# Patient Record
Sex: Male | Born: 1945 | Race: Black or African American | Hispanic: No | Marital: Married | Smoking: Former smoker
Health system: Southern US, Community
[De-identification: ages and names within clinical notes are randomized; demographics above are authoritative.]

## PROBLEM LIST (undated history)

## (undated) DIAGNOSIS — I2510 Atherosclerotic heart disease of native coronary artery without angina pectoris: Secondary | ICD-10-CM

## (undated) DIAGNOSIS — F0390 Unspecified dementia without behavioral disturbance: Secondary | ICD-10-CM

## (undated) DIAGNOSIS — Z951 Presence of aortocoronary bypass graft: Secondary | ICD-10-CM

## (undated) DIAGNOSIS — E7800 Pure hypercholesterolemia, unspecified: Secondary | ICD-10-CM

## (undated) DIAGNOSIS — M109 Gout, unspecified: Secondary | ICD-10-CM

## (undated) DIAGNOSIS — I10 Essential (primary) hypertension: Secondary | ICD-10-CM

## (undated) DIAGNOSIS — I214 Non-ST elevation (NSTEMI) myocardial infarction: Secondary | ICD-10-CM

## (undated) DIAGNOSIS — G4730 Sleep apnea, unspecified: Secondary | ICD-10-CM

## (undated) DIAGNOSIS — Z720 Tobacco use: Secondary | ICD-10-CM

## (undated) HISTORY — PX: CARDIAC SURGERY: SHX584

## (undated) HISTORY — PX: BACK SURGERY: SHX140

## (undated) HISTORY — DX: Unspecified dementia, unspecified severity, without behavioral disturbance, psychotic disturbance, mood disturbance, and anxiety: F03.90

---

## 2000-07-27 ENCOUNTER — Ambulatory Visit (HOSPITAL_COMMUNITY): Admission: RE | Admit: 2000-07-27 | Discharge: 2000-07-27 | Payer: Self-pay | Admitting: *Deleted

## 2000-07-27 ENCOUNTER — Encounter: Payer: Self-pay | Admitting: Family Medicine

## 2000-10-07 ENCOUNTER — Encounter: Payer: Self-pay | Admitting: Neurological Surgery

## 2000-10-09 ENCOUNTER — Encounter: Payer: Self-pay | Admitting: Neurological Surgery

## 2000-10-09 ENCOUNTER — Ambulatory Visit (HOSPITAL_COMMUNITY): Admission: RE | Admit: 2000-10-09 | Discharge: 2000-10-10 | Payer: Self-pay | Admitting: Neurological Surgery

## 2002-12-02 HISTORY — PX: COLONOSCOPY: SHX174

## 2003-09-12 ENCOUNTER — Ambulatory Visit (HOSPITAL_COMMUNITY): Admission: RE | Admit: 2003-09-12 | Discharge: 2003-09-12 | Payer: Self-pay | Admitting: Internal Medicine

## 2003-11-29 ENCOUNTER — Ambulatory Visit (HOSPITAL_COMMUNITY): Admission: RE | Admit: 2003-11-29 | Discharge: 2003-11-29 | Payer: Self-pay | Admitting: Family Medicine

## 2004-10-04 ENCOUNTER — Ambulatory Visit (HOSPITAL_COMMUNITY): Admission: RE | Admit: 2004-10-04 | Discharge: 2004-10-04 | Payer: Self-pay | Admitting: Family Medicine

## 2005-01-31 ENCOUNTER — Ambulatory Visit (HOSPITAL_COMMUNITY): Admission: RE | Admit: 2005-01-31 | Discharge: 2005-01-31 | Payer: Self-pay | Admitting: Family Medicine

## 2007-12-03 HISTORY — PX: COLONOSCOPY: SHX174

## 2008-09-22 ENCOUNTER — Ambulatory Visit: Payer: Self-pay | Admitting: Internal Medicine

## 2008-09-27 ENCOUNTER — Encounter: Payer: Self-pay | Admitting: Internal Medicine

## 2008-09-27 ENCOUNTER — Ambulatory Visit (HOSPITAL_COMMUNITY): Admission: RE | Admit: 2008-09-27 | Discharge: 2008-09-27 | Payer: Self-pay | Admitting: Internal Medicine

## 2008-09-27 ENCOUNTER — Ambulatory Visit: Payer: Self-pay | Admitting: Internal Medicine

## 2008-11-18 ENCOUNTER — Ambulatory Visit (HOSPITAL_COMMUNITY): Admission: RE | Admit: 2008-11-18 | Discharge: 2008-11-18 | Payer: Self-pay | Admitting: Family Medicine

## 2010-12-28 ENCOUNTER — Ambulatory Visit (HOSPITAL_COMMUNITY)
Admission: RE | Admit: 2010-12-28 | Discharge: 2010-12-28 | Payer: Self-pay | Source: Home / Self Care | Attending: Family Medicine | Admitting: Family Medicine

## 2011-04-16 NOTE — H&P (Signed)
NAME:  ADVIT, TRETHEWEY          ACCOUNT NO.:  [id number]   MEDICAL RECORD NO.:  [id number]          PATIENT TYPE:  AMB   LOCATION:  DAY                           FACILITY:  [facility]   PHYSICIAN:  R. Roetta Sessions, M.D. DATE OF BIRTH:  1946-04-29   DATE OF ADMISSION:  DATE OF DISCHARGE:  LH                              HISTORY & PHYSICAL   CHIEF COMPLAINT:  Time for colonoscopy.   PRIMARY CARE PHYSICIAN:  Angus G. Renard Matter, MD   HISTORY OF PRESENT ILLNESS:  Mr. Boley is a very pleasant 65-year-  old African American gentleman who presents today to schedule his  followup colonoscopy.  He has a history of a tubular adenoma and  tubulovillous adenoma back in 2001, at the time of screening  colonoscopy.  He had a followup exam in October 2004, but did not have  any recurrent polyps at that time.  It was recommended that he followup  for surveillance colonoscopy in 5 years.  He has been doing very well.  No melena, rectal bleeding, constipation, diarrhea, abdominal pain,  nausea, vomiting, heartburn, dysphagia, odynophagia, or weight loss.   CURRENT MEDICATIONS:  1. Lipitor 40 mg nightly.  2. Vytorin daily.  3. Diovan daily.  4. Aspirin p.r.n.   ALLERGIES:  No known drug allergies.   PAST MEDICAL HISTORY:  1. Hypertension.  2. Hypercholesterolemia.  3. He has had back surgery.  4. History of colon polyps as above.   FAMILY HISTORY:  Negative for colorectal cancer or colorectal polyps.   SOCIAL HISTORY:  He is married, he has 3 children.  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  He smokes 8-9 cigarettes a day.  No  alcohol use.   REVIEW OF SYSTEMS:  See HPI for GI.  CONSTITUTIONAL: No weight loss.  CARDIOPULMONARY:  No chest pain, shortness of breath, palpitations, or  cough.  GENITOURINARY: No dysuria or hematuria.   PHYSICAL EXAMINATION:  VITAL SIGNS: Weight 204, height 6 feet, temp  97.9, blood pressure 150/100, and pulse 80.  GENERAL:  A pleasant,  well-nourished, well-developed white gentleman in  no acute distress.  SKIN: Warm and dry.  No jaundice.  HEENT: Sclerae nonicteric.  Oropharyngeal mucosa moist and pink.  No  lesions, erythema, or exudate.  No lymphadenopathy or thyromegaly.  CHEST: Lungs were clear to auscultation.  CARDIAC: Regular rate and rhythm.  Normal S1 and S2.  No murmurs, rubs,  or gallops.  ABDOMEN: Positive bowel sounds.  Abdomen soft, nontender, and  nondistended.  No organomegaly or masses.  No rebound or guarding.  No  abdominal bruits or hernias.  LOWER EXTREMITIES: No edema.   IMPRESSION:  Mr. 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  His last colonoscopy was 5 years ago.  He is due for a  surveillance exam.  I have discussed risks, alternatives, and benefits  with regards to but not limited to the risks, reaction to medication,  bleeding, infection, and perforation, and he is agreeable to proceed.   PLAN:  Colonoscopy in the  near future with Dr. Jena Gauss.  We will hold his  aspirin 4 days prior to procedure.      Tana Coast, P.AJonathon Bellows, M.D.  Electronically Signed    LL/MEDQ  D:  09/22/2008  T:  09/23/2008  Job:  [id number]   cc:   Angus G. Renard Matter, MD  Fax: [phone]

## 2011-04-16 NOTE — Op Note (Signed)
NAME:  TERRAN, KLINKE          ACCOUNT NO.:  [id number]   MEDICAL RECORD NO.:  [id number]          PATIENT TYPE:  AMB   LOCATION:  DAY                           FACILITY:  [facility]   PHYSICIAN:  R. Roetta Sessions, M.D. DATE OF BIRTH:  02-02-1946   DATE OF PROCEDURE:  DATE OF DISCHARGE:                               OPERATIVE REPORT   PROCEDURE:  Colonoscopy with snare polypectomy.   INDICATIONS FOR PROCEDURE:  A 65 year old gentleman with a history of  tubulovillous adenomas, removed his colon in 2001, negative colonoscopy  in 2004.  He is here for surveillance.  Risks, benefits, and  alternatives have been reviewed, questions answered.  She is not having  any lower GI tract symptoms at this time.   PROCEDURE NOTE:  O2 saturation, blood pressure, pulse, and respirations  monitored throughout the entire procedure.   CONSCIOUS SEDATION:  Versed 3 mg IV, Demerol 75 mg IV in divided doses.   INSTRUMENT:  Pentax video chip system.   FINDINGS:  Digital rectal exam reveals no abnormalities.  The scope was  placed.  The prep was good.  Colon:  Colonic mucosa was surveyed from  the rectosigmoid junction through the left transverse right colon to the  appendiceal orifice, ileocecal valve, and cecum.  These structures well  seen and photographed for the record.  From this level, the scope was  slowly withdrawn.  All previously mentioned mucosal surfaces were again  seen.  The patient had a 6-mm polyp in the ascending segment and one in  the sigmoid, both these polyps were cold snared.  The remainder of the  colonic mucosa appeared entirely normal.  Scope was pulled down in the  rectum where thorough examination of rectal mucosa including retroflexed  view of the anal verge demonstrated no abnormalities.  The patient  tolerated the procedure well and was reactive to Endoscopy.   IMPRESSION:  1. Normal rectum.  2. Colonic polyps resected with snare technique as described above.   RECOMMENDATIONS:  1. We will follow up on path.  2. Further recommendations to follow.     Jonathon Bellows, M.D.  Electronically Signed    RMR/MEDQ  D:  09/27/2008  T:  09/28/2008  Job:  [id number]

## 2011-04-19 NOTE — Op Note (Signed)
NAME:  Timothy Simpson, Timothy Simpson                    ACCOUNT NO.:  [id number]   MEDICAL RECORD NO.:  [id number]                   PATIENT TYPE:  AMB   LOCATION:  DAY                                  FACILITY:  [facility]   PHYSICIAN:  R. Roetta Sessions, M.D.              DATE OF BIRTH:  [birth year]   DATE OF PROCEDURE:  09/12/2003  DATE OF DISCHARGE:                                 OPERATIVE REPORT   PROCEDURE:  Screening colonoscopy.   ENDOSCOPIST:  Gerrit Friends. Rourk, M.D.   INDICATIONS FOR PROCEDURE:  The patient is a 65 year old gentleman with a  history of tubular villus adenoma of his right colon 3 years ago.  He comes  for surveillance.  He is devoid of any GI tract symptoms. Surveillance  colonoscopy has been discussed with the patient at length.  The potential  risks, benefits, and alternatives have been reviewed, questions answered.  He is agreeable. Please see my handwritten H&P.   PROCEDURE NOTE:  O2 saturation, blood pressure, pulse and respirations were  monitored throughout the entire procedure.  Conscious sedation: Versed 4 mg  IV, Demerol 100 mg IV in divided doses.   INSTRUMENT:  Olympus video chip adult colonoscope.   FINDINGS:  Digital rectal exam revealed no abnormalities.   ENDOSCOPIC FINDINGS:  The prep was excellent.   RECTUM:  Examination of the rectal mucosa including the retroflex view of  the anal verge revealed no abnormalities.   COLON:  The colonic mucosa was surveyed from the rectosigmoid junction  through the left transverse and right colon to the area of the appendiceal  orifice, ileocecal valve, and cecum.  These structures were well seen and  photographed for the record.   From the level of the cecum and ileocecal valve the scope was slowly  withdrawn.  All previously mentioned mucosal surfaces were again seen; and  no abnormalities were observed.  The patient tolerated the procedure well  and was reacted in endoscopy.   IMPRESSION:  1. Normal  rectum.  2. Normal colon.   RECOMMENDATIONS:  1. Repeat colonoscopy in 5 years.  2.     Blood pressure was elevated today, although it came down after the     procedure.  He is supposed to be taking Toprol but did not take his dose     today. I have urged him to follow up with Dr. Renard Matter for recheck later     in the week.      ___________________________________________                                            Jonathon Bellows, M.D.   RMR/MEDQ  D:  09/12/2003  T:  09/12/2003  Job:  [id number]   cc:   Angus G. Renard Matter, M.D.  [address]  [place]  [place] [address]  Fax: [phone]

## 2011-04-19 NOTE — Op Note (Signed)
[place]  Patient:    Timothy Simpson, Timothy Simpson                 MRN: [id number] Proc. Date: 10/09/00 Adm. 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  OPERATION PERFORMED:  Lumbar microdiskectomy L5-S1 right with operating microscope and microdissection technique.  SURGEON:  Stefani Dama, M.D.  ASSISTANT:  Tanya Nones. Jeral Fruit, M.D.  ANESTHESIA:  General endotracheal.  INDICATIONS FOR PROCEDURE:  The patient is a 65 year old individual who had a lumbar laminectomy in 1984 for herniated nucleus pulposus.  He did well after that time but about three months ago developed recurrence of right leg pain that has been quite severe and unrelenting.  An MRI demonstrates a small recurrent disk herniation at the L5-S1 level that goes behind the body of S1. He was taken to the operating room.  DESCRIPTION OF PROCEDURE:  The patient was brought to the operating room supine on the stretcher.  After smooth induction of general endotracheal anesthesia, he was turned prone.  The back was shaved and prepped with DuraPrep and draped in sterile fashion.  An elliptical incision was made around the previous scar.  This was excised.  The soft tissue was dissected down to the lumbodorsal fascia on the right side and this area was opened. The interlaminar space of L5-S1 was identified positively on radiograph.  The inferior margin of the lamina of L5 was cleared out the mesial wall of the facet and then a Midas Rex and AM8 bur was used to remove inferior portion of the lamina of L5 and the mesial wall of the facet until the common dural tube and then the take off of the S1 nerve root was identified.  This was noted to be  significantly fibrotic with epidural scar over it.  As this was uncovered, the microscope was draped and brought into the field and then using microdissection technique, the dura was dissected free from the scar tissue. The S1 nerve root was freed and was noted to be bowed dorsally.  As it was moved laterally, there was a small bulge from the area of the disk space, this was incised and a free fragment of disk was found just underneath this and underneath the S1 nerve.  This allowed the S1 nerve root to be further mobilized and using further microdissection technique, the nerve root was freed.  Several other fragments of disk were recovered from behind the body of S1 and in the disk space.  The disk space was cleared of a significant quantity of degenerative disk material.  In the end, the common dural tube and the S1 nerve root were free and clear and were easily mobile.  Hemostasis of the soft tissuess was obtained with a bipolar cautery and then the microscope was removed from the field.  The lumbodorsal fascia was closed with #1 Vicryl in interrupted fashion, 2-0 Vicryl was used in the subcutaneous tissues and subcuticular tissues, 3-0 Vicryl was used to close the subcuticular skin.  The patient tolerated the procedure well and was returned to the recovery room in stable condition.DD:  10/09/00 TD:  10/09/00 Job: [id number]

## 2013-01-20 ENCOUNTER — Other Ambulatory Visit (HOSPITAL_COMMUNITY): Payer: Self-pay | Admitting: Family Medicine

## 2013-01-20 ENCOUNTER — Ambulatory Visit (HOSPITAL_COMMUNITY)
Admission: RE | Admit: 2013-01-20 | Discharge: 2013-01-20 | Disposition: A | Discharge status: Home / Self Care | Payer: BC Managed Care – PPO | Source: Ambulatory Visit | Attending: Family Medicine | Admitting: Family Medicine

## 2013-01-20 DIAGNOSIS — I10 Essential (primary) hypertension: Secondary | ICD-10-CM

## 2013-01-20 DIAGNOSIS — F17200 Nicotine dependence, unspecified, uncomplicated: Secondary | ICD-10-CM

## 2013-01-20 DIAGNOSIS — I77819 Aortic ectasia, unspecified site: Secondary | ICD-10-CM | POA: Insufficient documentation

## 2013-10-01 ENCOUNTER — Encounter: Payer: Self-pay | Admitting: Internal Medicine

## 2013-12-10 ENCOUNTER — Other Ambulatory Visit (HOSPITAL_COMMUNITY): Payer: Self-pay | Admitting: Family Medicine

## 2013-12-10 ENCOUNTER — Ambulatory Visit (HOSPITAL_COMMUNITY)
Admission: RE | Admit: 2013-12-10 | Discharge: 2013-12-10 | Disposition: A | Discharge status: Home / Self Care | Payer: Medicare Other | Source: Ambulatory Visit | Attending: Family Medicine | Admitting: Family Medicine

## 2013-12-10 DIAGNOSIS — F17210 Nicotine dependence, cigarettes, uncomplicated: Secondary | ICD-10-CM

## 2013-12-10 DIAGNOSIS — F17200 Nicotine dependence, unspecified, uncomplicated: Secondary | ICD-10-CM | POA: Insufficient documentation

## 2014-10-08 ENCOUNTER — Emergency Department (HOSPITAL_COMMUNITY)
Admission: EM | Admit: 2014-10-08 | Discharge: 2014-10-08 | Disposition: A | Discharge status: Home / Self Care | Payer: Medicare Other | Attending: Emergency Medicine | Admitting: Emergency Medicine

## 2014-10-08 ENCOUNTER — Encounter (HOSPITAL_COMMUNITY): Payer: Self-pay | Admitting: *Deleted

## 2014-10-08 DIAGNOSIS — Z79899 Other long term (current) drug therapy: Secondary | ICD-10-CM | POA: Insufficient documentation

## 2014-10-08 DIAGNOSIS — K5900 Constipation, unspecified: Secondary | ICD-10-CM | POA: Diagnosis present

## 2014-10-08 DIAGNOSIS — Z720 Tobacco use: Secondary | ICD-10-CM | POA: Diagnosis not present

## 2014-10-08 DIAGNOSIS — I10 Essential (primary) hypertension: Secondary | ICD-10-CM | POA: Diagnosis not present

## 2014-10-08 DIAGNOSIS — K5641 Fecal impaction: Secondary | ICD-10-CM

## 2014-10-08 HISTORY — DX: Essential (primary) hypertension: I10

## 2014-10-08 MED ORDER — FLEET ENEMA 7-19 GM/118ML RE ENEM
1.0000 | ENEMA | Freq: Once | RECTAL | Status: AC
  Administered 2014-10-08: 1 via RECTAL

## 2014-10-08 MED ORDER — MAGNESIUM CITRATE PO SOLN: ORAL | Status: DC

## 2014-10-08 NOTE — Discharge Instructions (Signed)
Constipation °Constipation is when a person has fewer than three bowel movements a week, has difficulty having a bowel movement, or has stools that are dry, hard, or larger than normal. As people grow older, constipation is more common. If you try to fix constipation with medicines that make you have a bowel movement (laxatives), the problem may get worse. Long-term laxative use may cause the muscles of the colon to become weak. A low-fiber diet, not taking in enough fluids, and taking certain medicines may make constipation worse.  °CAUSES  °· Certain medicines, such as antidepressants, pain medicine, iron supplements, antacids, and water pills.   °· Certain diseases, such as diabetes, irritable bowel syndrome (IBS), thyroid disease, or depression.   °· Not drinking enough water.   °· Not eating enough fiber-rich foods.   °· Stress or travel.   °· Lack of physical activity or exercise.   °· Ignoring the urge to have a bowel movement.   °· Using laxatives too much.   °SIGNS AND SYMPTOMS  °· Having fewer than three bowel movements a week.   °· Straining to have a bowel movement.   °· Having stools that are hard, dry, or larger than normal.   °· Feeling full or bloated.   °· Pain in the lower abdomen.   °· Not feeling relief after having a bowel movement.   °DIAGNOSIS  °Your health care provider will take a medical history and perform a physical exam. Further testing may be done for severe constipation. Some tests may include: °· A barium enema X-ray to examine your rectum, colon, and, sometimes, your small intestine.   °· A sigmoidoscopy to examine your lower colon.   °· A colonoscopy to examine your entire colon. °TREATMENT  °Treatment will depend on the severity of your constipation and what is causing it. Some dietary treatments include drinking more fluids and eating more fiber-rich foods. Lifestyle treatments may include regular exercise. If these diet and lifestyle recommendations do not help, your health care  provider may recommend taking over-the-counter laxative medicines to help you have bowel movements. Prescription medicines may be prescribed if over-the-counter medicines do not work.  °HOME CARE INSTRUCTIONS  °· Eat foods that have a lot of fiber, such as fruits, vegetables, whole grains, and beans. °· Limit foods high in fat and processed sugars, such as french fries, hamburgers, cookies, candies, and soda.   °· A fiber supplement may be added to your diet if you cannot get enough fiber from foods.   °· Drink enough fluids to keep your urine clear or pale yellow.   °· Exercise regularly or as directed by your health care provider.   °· Go to the restroom when you have the urge to go. Do not hold it.   °· Only take over-the-counter or prescription medicines as directed by your health care provider. Do not take other medicines for constipation without talking to your health care provider first.   °SEEK IMMEDIATE MEDICAL CARE IF:  °· You have bright red blood in your stool.   °· Your constipation lasts for more than 4 days or gets worse.   °· You have abdominal or rectal pain.   °· You have thin, pencil-like stools.   °· You have unexplained weight loss. °MAKE SURE YOU:  °· Understand these instructions. °· Will watch your condition. °· Will get help right away if you are not doing well or get worse. °Document Released: 08/16/2004 Document Revised: 11/23/2013 Document Reviewed: 08/30/2013 °[employer]® Patient Information ©2015 [employer]. This information is not intended to replace advice given to you by your health care provider. Make sure you discuss any questions   you have with your health care provider.  Fecal Impaction A fecal impaction happens when there is a large, firm amount of stool (or feces) that cannot be passed. The impacted stool is usually in the rectum, which is the lowest part of the large bowel. The impacted stool can block the colon and cause significant problems. CAUSES  The longer stool  stays in the rectum, the harder it gets. Anything that slows down your bowel movements can lead to fecal impaction, such as:  Constipation. This can be a long-standing (chronic) problem or can happen suddenly (acute).  Painful conditions of the rectum, such as hemorrhoids or anal fissures. The pain of these conditions can make you try to avoid having bowel movements.  Narcotic pain-relieving medicines, such as methadone, morphine, or codeine.  Not drinking enough fluids.  Inactivity and bed rest over long periods of time.  Diseases of the brain or nervous system that damage the nerves controlling the muscles of the intestines. SIGNS AND SYMPTOMS   Lack of normal bowel movements or changes in bowel patterns.  Sense of fullness in the rectum but unable to pass stool.  Pain or cramps in the abdominal area (often after meals).  Thin, watery discharge from the rectum. DIAGNOSIS  Your health care provider may suspect that you have a fecal impaction based on your symptoms and a physical exam. This will include an exam of your rectum. Sometimes X-rays or lab testing may be needed to confirm the diagnosis and to be sure there are no other problems.  TREATMENT   Initially an impaction can be removed manually. Using a gloved finger, your health care provider can remove hard stool from your rectum.  Medicine is sometimes needed. A suppository or enema can be given in the rectum to soften the stool, which can stimulate a bowel movement. Medicines can also be given by mouth (orally).  Though rare, surgery may be needed if the colon has torn (perforated) due to blockage. HOME CARE INSTRUCTIONS   Develop regular bowel habits. This could include getting in the habit of having a bowel movement after your morning cup of coffee or after eating. Be sure to allow yourself enough time on the toilet.  Maintain a high-fiber diet.  Drink enough fluids to keep your urine clear or pale yellow as directed by  your health care provider.  Exercise regularly.  If you begin to get constipated, increase the amount of fiber in your diet. Eat plenty of fruits, vegetables, whole wheat breads, bran, oatmeal, and similar products.  Take natural fiber laxatives or other laxatives only as directed by your health care provider. SEEK MEDICAL CARE IF:   You have ongoing rectal pain.  You require enemas or suppositories more than twice a week.  You have rectal bleeding.  You have continued problems, or you develop abdominal pain.  You have thin, pencil-like stools. SEEK IMMEDIATE MEDICAL CARE IF:  You have black or tarry stools. MAKE SURE YOU:   Understand these instructions.  Will watch your condition.  Will get help right away if you are not doing well or get worse. Document Released: 08/10/2004 Document Revised: 09/08/2013 Document Reviewed: 05/25/2013 [facility] Patient Information 2015 [place], [place]. This information is not intended to replace advice given to you by your health care provider. Make sure you discuss any questions you have with your health care provider.

## 2014-10-08 NOTE — ED Provider Notes (Signed)
CSN: [id number]     Arrival date & time 10/08/14  1138 History   First MD Initiated Contact with Patient 10/08/14 1220     Chief Complaint  Patient presents with  . Constipation      HPI  Pt states that he could have a BM this am.  Dis self digital rectal exam and "felt something hard".  No blood.  No abdominal pain or N/V.  Past Medical History  Diagnosis Date  . Hypertension    Past Surgical History  Procedure Laterality Date  . Back surgery     No family history on file. History  Substance Use Topics  . Smoking status: Current Every Day Smoker -- 1.00 packs/day    Types: Cigarettes  . Smokeless tobacco: Not on file  . Alcohol Use: No    Review of Systems  Constitutional: Negative for fever, chills, diaphoresis, appetite change and fatigue.  HENT: Negative for mouth sores, sore throat and trouble swallowing.   Eyes: Negative for visual disturbance.  Respiratory: Negative for cough, chest tightness, shortness of breath and wheezing.   Cardiovascular: Negative for chest pain.  Gastrointestinal: Positive for constipation. Negative for nausea, vomiting, abdominal pain, diarrhea and abdominal distention.  Endocrine: Negative for polydipsia, polyphagia and polyuria.  Genitourinary: Negative for dysuria, frequency and hematuria.  Musculoskeletal: Negative for gait problem.  Skin: Negative for color change, pallor and rash.  Neurological: Negative for dizziness, syncope, light-headedness and headaches.  Hematological: Does not bruise/bleed easily.  Psychiatric/Behavioral: Negative for behavioral problems and confusion.      Allergies  Review of patient's allergies indicates no known allergies.  Home Medications   Prior to Admission medications   Medication Sig Start Date End Date Taking? Authorizing Provider  CRESTOR 20 MG tablet Take 20 mg by mouth daily. 07/07/14  Yes Historical Provider, MD  metoprolol (LOPRESSOR) 100 MG tablet Take 100 mg by mouth daily. 09/19/14   Yes Historical Provider, MD  valsartan-hydrochlorothiazide (DIOVAN-HCT) 160-25 MG per tablet Take 1 tablet by mouth daily. 09/22/14  Yes Historical Provider, MD  magnesium citrate SOLN half bottle today, half bottle tomorrow. 10/08/14   Tanna Furry, MD   BP 135/85 mmHg  Pulse 65  Temp(Src) 98.3 F (36.8 C) (Oral)  Resp 20  Ht 6\' 2"  (1.88 m)  Wt 196 lb (88.905 kg)  BMI 25.15 kg/m2  SpO2 100% Physical Exam  Constitutional: He is oriented to person, place, and time. He appears well-developed and well-nourished. No distress.  HENT:  Head: Normocephalic.  Eyes: Conjunctivae are normal. Pupils are equal, round, and reactive to light. No scleral icterus.  Neck: Normal range of motion. Neck supple. No thyromegaly present.  Cardiovascular: Normal rate and regular rhythm.  Exam reveals no gallop and no friction rub.   No murmur heard. Pulmonary/Chest: Effort normal and breath sounds normal. No respiratory distress. He has no wheezes. He has no rales.  Abdominal: Soft. Bowel sounds are normal. He exhibits no distension. There is no tenderness. There is no rebound.  Genitourinary:  Firm stool on rectal exam. Quite proximal. Not able to disimpact.  Musculoskeletal: Normal range of motion.  Neurological: He is alert and oriented to person, place, and time.  Skin: Skin is warm and dry. No rash noted.  Psychiatric: He has a normal mood and affect. His behavior is normal.    ED Course  Procedures (including critical care time) Labs Review Labs Reviewed - No data to display  Imaging Review No results found.   EKG Interpretation None  MDM   Final diagnoses:  Constipation, unspecified constipation type  Fecal impaction    Excellent results from fleets enema.  Much improved. DC with mg Citrate.    Tanna Furry, MD 10/08/14 [id number]

## 2014-10-08 NOTE — ED Notes (Signed)
Pt states he had a "hard time" going to the bathroom this morning and only produced a small amount of feces. He went to the bathroom normally yesterday. Pt denies pain and only states his stomach feels full.

## 2014-12-15 ENCOUNTER — Ambulatory Visit (HOSPITAL_COMMUNITY)
Admission: RE | Admit: 2014-12-15 | Discharge: 2014-12-15 | Disposition: A | Discharge status: Home / Self Care | Payer: Medicare Other | Source: Ambulatory Visit | Attending: Family Medicine | Admitting: Family Medicine

## 2014-12-15 ENCOUNTER — Other Ambulatory Visit (HOSPITAL_COMMUNITY): Payer: Self-pay | Admitting: Family Medicine

## 2014-12-15 DIAGNOSIS — F17210 Nicotine dependence, cigarettes, uncomplicated: Secondary | ICD-10-CM | POA: Diagnosis not present

## 2014-12-15 DIAGNOSIS — I10 Essential (primary) hypertension: Secondary | ICD-10-CM | POA: Insufficient documentation

## 2015-01-14 ENCOUNTER — Emergency Department (HOSPITAL_COMMUNITY): Payer: Medicare Other

## 2015-01-14 ENCOUNTER — Emergency Department (HOSPITAL_COMMUNITY)
Admission: EM | Admit: 2015-01-14 | Discharge: 2015-01-14 | Disposition: A | Discharge status: Home / Self Care | Payer: Medicare Other | Attending: Emergency Medicine | Admitting: Emergency Medicine

## 2015-01-14 ENCOUNTER — Encounter (HOSPITAL_COMMUNITY): Payer: Self-pay | Admitting: *Deleted

## 2015-01-14 DIAGNOSIS — Z720 Tobacco use: Secondary | ICD-10-CM | POA: Diagnosis not present

## 2015-01-14 DIAGNOSIS — K5900 Constipation, unspecified: Secondary | ICD-10-CM | POA: Insufficient documentation

## 2015-01-14 DIAGNOSIS — Z79899 Other long term (current) drug therapy: Secondary | ICD-10-CM | POA: Diagnosis not present

## 2015-01-14 DIAGNOSIS — I10 Essential (primary) hypertension: Secondary | ICD-10-CM | POA: Insufficient documentation

## 2015-01-14 MED ORDER — FLEET ENEMA 7-19 GM/118ML RE ENEM
1.0000 | ENEMA | Freq: Once | RECTAL | Status: AC
  Administered 2015-01-14: 1 via RECTAL

## 2015-01-14 NOTE — ED Notes (Addendum)
Pt states he has taken laxatives with no relief. Has attempted to have BM several times today. Last BM was 3 days ago. Denies abdominal pain, denes N/V

## 2015-01-14 NOTE — ED Notes (Signed)
MD at bedside. 

## 2015-01-14 NOTE — ED Notes (Signed)
Pt reports having a large bowel movement. "I feel like a new man".

## 2015-01-14 NOTE — ED Provider Notes (Signed)
CSN: [id number]     Arrival date & time 01/14/15  1612 History  This chart was scribed for Nat Christen, MD by Stephania Fragmin, ED Scribe. This patient was seen in room APA09/APA09 and the patient's care was started at 4:35 PM.    Chief Complaint  Patient presents with  . Constipation   The history is provided by the patient and medical records. No language interpreter was used.     HPI Comments: Timothy Simpson is a 69 y.o. male who presents to the Emergency Department complaining of constant constipation for the last 3 days--his last BM occurred 3 days ago. He reports associated pain around his anus whenever he sits down. Patient normally has regular BMs, but reports that this week he ate a lot of corn and beans. Per nursing notes, patient has taken laxatives without relief. No other modifying factors were noted. He denies abdominal pain or any other pain.   Past Medical History  Diagnosis Date  . Hypertension    Past Surgical History  Procedure Laterality Date  . Back surgery     No family history on file. History  Substance Use Topics  . Smoking status: Current Every Day Smoker -- 1.00 packs/day    Types: Cigarettes  . Smokeless tobacco: Not on file  . Alcohol Use: No    Review of Systems  Constitutional: Negative for appetite change.  Gastrointestinal: Positive for constipation. Negative for abdominal pain.  Musculoskeletal: Negative for myalgias.  All other systems reviewed and are negative.     Allergies  Review of patient's allergies indicates no known allergies.  Home Medications   Prior to Admission medications   Medication Sig Start Date End Date Taking? Authorizing Provider  CRESTOR 20 MG tablet Take 20 mg by mouth daily. 07/07/14  Yes Historical Provider, MD  metoprolol (LOPRESSOR) 100 MG tablet Take 100 mg by mouth daily. 09/19/14  Yes Historical Provider, MD  tetrahydrozoline 0.05 % ophthalmic solution Place 1 drop into both eyes daily.   Yes Historical  Provider, MD  valsartan-hydrochlorothiazide (DIOVAN-HCT) 160-25 MG per tablet Take 1 tablet by mouth daily. 09/22/14  Yes Historical Provider, MD  magnesium citrate SOLN half bottle today, half bottle tomorrow. Patient not taking: Reported on 01/14/2015 10/08/14   Tanna Furry, MD   BP 125/84 mmHg  Pulse 77  Temp(Src) 98.6 F (37 C) (Oral)  Resp 16  Ht 6' (1.829 m)  Wt 196 lb (88.905 kg)  BMI 26.58 kg/m2  SpO2 99% Physical Exam  Constitutional: He is oriented to person, place, and time. He appears well-developed and well-nourished.  HENT:  Head: Normocephalic and atraumatic.  Eyes: Conjunctivae and EOM are normal. Pupils are equal, round, and reactive to light.  Neck: Normal range of motion. Neck supple.  Cardiovascular: Normal rate and regular rhythm.   Pulmonary/Chest: Effort normal and breath sounds normal.  Abdominal: Soft. Bowel sounds are normal.  Musculoskeletal: Normal range of motion.  Neurological: He is alert and oriented to person, place, and time.  Skin: Skin is warm and dry.  Psychiatric: He has a normal mood and affect. His behavior is normal.  Nursing note and vitals reviewed.   ED Course  Fecal disimpaction Date/Time: 01/14/2015 6:15 PM Performed by: Nat Christen Authorized by: Nat Christen Comments: Digital disimpaction performed by examiner. Fleets enema applied. Patient had large bowel movement.   (including critical care time)  DIAGNOSTIC STUDIES: Oxygen Saturation is 99% on room air, normal by my interpretation.    COORDINATION OF CARE:  4:36 PM - Discussed treatment plan with pt at bedside which includes XR abdomen and possible digital rectal disimpaction, and pt agreed to plan.    Imaging Review Dg Abd Acute W/chest  01/14/2015   CLINICAL DATA:  Constipation for 3 days  EXAM: ACUTE ABDOMEN SERIES (ABDOMEN 2 VIEW & CHEST 1 VIEW)  COMPARISON:  Chest radiograph December 15, 2014; CT abdomen and pelvis January 31, 2005  FINDINGS: PA chest: There is no edema or  consolidation. The heart size and pulmonary vascularity are normal. No adenopathy. There is upper thoracic levoscoliosis.  Supine and upright abdomen: There is diffuse stool throughout the colon. There is no appreciable bowel dilatation. However, there are multiple air-fluid levels throughout the abdomen. No free air. There is degenerative change in the lumbar spine. There are calcifications in the region of the left kidney. Largest of these calcifications measures 6 x 5 mm.  IMPRESSION: Diffuse stool in colon. Multiple air-fluid levels. Suspect ileus or enteritis. Early obstruction could present in this manner, but ileus or enteritis are felt to be more likely. No free air. No lung edema or consolidation.   Electronically Signed   By: Lowella Grip III M.D.   On: 01/14/2015 17:07     MDM   Final diagnoses:  Constipation   History and physical consistent with constipation. Digital disimpaction performed. Fleets enema applied. Patient had good results. No acute abdomen.  I personally performed the services described in this documentation, which was scribed in my presence. The recorded information has been reviewed and is accurate.    Nat Christen, MD 01/14/15 [id number]

## 2015-01-14 NOTE — Discharge Instructions (Signed)
Constipation Constipation is when a person:  Poops (has a bowel movement) less than 3 times a week.  Has a hard time pooping.  Has poop that is dry, hard, or bigger than normal. HOME CARE   Eat foods with a lot of fiber in them. This includes fruits, vegetables, beans, and whole grains such as brown rice.  Avoid fatty foods and foods with a lot of sugar. This includes french fries, hamburgers, cookies, candy, and soda.  If you are not getting enough fiber from food, take products with added fiber in them (supplements).  Drink enough fluid to keep your pee (urine) clear or pale yellow.  Exercise on a regular basis, or as told by your doctor.  Go to the restroom when you feel like you need to poop. Do not hold it.  Only take medicine as told by your doctor. Do not take medicines that help you poop (laxatives) without talking to your doctor first. GET HELP RIGHT AWAY IF:   You have bright red blood in your poop (stool).  Your constipation lasts more than 4 days or gets worse.  You have belly (abdominal) or butt (rectal) pain.  You have thin poop (as thin as a pencil).  You lose weight, and it cannot be explained. MAKE SURE YOU:   Understand these instructions.  Will watch your condition.  Will get help right away if you are not doing well or get worse. Document Released: 05/06/2008 Document Revised: 11/23/2013 Document Reviewed: 08/30/2013 [facility] Patient Information 2015 [place], [place]. This information is not intended to replace advice given to you by your health care provider. Make sure you discuss any questions you have with your health care provider.   Increase fluid, fruit, fibers. Try magnesium citrate which is a beverage for constipation. Also Miralax.  Can try fleet's enema.

## 2016-11-14 ENCOUNTER — Encounter (HOSPITAL_COMMUNITY): Payer: Self-pay

## 2016-11-14 ENCOUNTER — Emergency Department (HOSPITAL_COMMUNITY): Payer: Medicare Other

## 2016-11-14 ENCOUNTER — Emergency Department (HOSPITAL_COMMUNITY)
Admission: EM | Admit: 2016-11-14 | Discharge: 2016-11-14 | Disposition: A | Discharge status: Home / Self Care | Payer: Medicare Other | Attending: Emergency Medicine | Admitting: Emergency Medicine

## 2016-11-14 DIAGNOSIS — F17210 Nicotine dependence, cigarettes, uncomplicated: Secondary | ICD-10-CM | POA: Diagnosis not present

## 2016-11-14 DIAGNOSIS — I10 Essential (primary) hypertension: Secondary | ICD-10-CM | POA: Insufficient documentation

## 2016-11-14 DIAGNOSIS — M25561 Pain in right knee: Secondary | ICD-10-CM | POA: Insufficient documentation

## 2016-11-14 DIAGNOSIS — Z79899 Other long term (current) drug therapy: Secondary | ICD-10-CM | POA: Diagnosis not present

## 2016-11-14 MED ORDER — IBUPROFEN 400 MG PO TABS: 400.0000 mg | ORAL_TABLET | Freq: Four times a day (QID) | ORAL | 0 refills | Status: DC | PRN

## 2016-11-14 NOTE — ED Triage Notes (Signed)
Pt c/o pain below r knee for the past couple of days.  Denies injury.

## 2016-11-14 NOTE — ED Provider Notes (Signed)
[facility] DEPT Provider Note   CSN: [id number] Arrival date & time: 11/14/16  [id number]  By signing my name below, I, Rayna Sexton, attest that this documentation has been prepared under the direction and in the presence of Merrily Pew, MD. Electronically Signed: Rayna Sexton, ED Scribe. 11/14/16. 8:52 AM.   History   Chief Complaint Chief Complaint  Patient presents with  . Knee Pain   HPI HPI Comments: NNAMDI DREWS is a 70 y.o. male who presents to the Emergency Department complaining of atraumatic, mild, right knee pain x 1 day. Pt states he was lying in bed and went to stand up and felt as if "something was wrong with me knee". His pain worsens when climbing stairs. He reports mild swelling in the affected region. He took 200 mg ibuprofen w/o significant relief with his last dose being this morning. No h/o DM, gout or CA. No fevers, chills, nausea, vomiting or other associated symptoms at this time.   The history is provided by the patient and medical records. No language interpreter was used.    Past Medical History:  Diagnosis Date  . Hypertension     There are no active problems to display for this patient.   Past Surgical History:  Procedure Laterality Date  . BACK SURGERY       Home Medications    Prior to Admission medications   Medication Sig Start Date End Date Taking? Authorizing Provider  CRESTOR 20 MG tablet Take 20 mg by mouth daily. 07/07/14   Historical Provider, MD  ibuprofen (ADVIL,MOTRIN) 400 MG tablet Take 1 tablet (400 mg total) by mouth every 6 (six) hours as needed. 11/14/16   Merrily Pew, MD  magnesium citrate SOLN half bottle today, half bottle tomorrow. Patient not taking: Reported on 01/14/2015 10/08/14   Tanna Furry, MD  metoprolol (LOPRESSOR) 100 MG tablet Take 100 mg by mouth daily. 09/19/14   Historical Provider, MD  tetrahydrozoline 0.05 % ophthalmic solution Place 1 drop into both eyes daily.    Historical Provider, MD    valsartan-hydrochlorothiazide (DIOVAN-HCT) 160-25 MG per tablet Take 1 tablet by mouth daily. 09/22/14   Historical Provider, MD    Family History No family history on file.  Social History Social History  Substance Use Topics  . Smoking status: Current Every Day Smoker    Packs/day: 1.00    Types: Cigarettes  . Smokeless tobacco: Never Used  . Alcohol use No     Allergies   Patient has no known allergies.   Review of Systems Review of Systems  Constitutional: Negative for chills and fever.  Gastrointestinal: Negative for nausea and vomiting.  Musculoskeletal: Positive for arthralgias and joint swelling.  Skin: Negative for color change and wound.  Allergic/Immunologic: Negative for immunocompromised state.  Neurological: Negative for numbness.  All other systems reviewed and are negative.  Physical Exam Updated Vital Signs BP 137/73 (BP Location: Left Arm)   Pulse 67   Temp 97.8 F (36.6 C) (Oral)   Resp 18   Ht 6' (1.829 m)   Wt 214 lb (97.1 kg)   SpO2 97%   BMI 29.02 kg/m   Physical Exam  Constitutional: He is oriented to person, place, and time. He appears well-developed and well-nourished.  HENT:  Head: Normocephalic and atraumatic.  Eyes: Conjunctivae and EOM are normal.  Neck: Normal range of motion.  Cardiovascular: Normal rate.   Pulmonary/Chest: Effort normal. No respiratory distress.  Abdominal: Soft.  Musculoskeletal: Normal range of motion. He exhibits  tenderness.  Tenderness overlying the right patellar tendon. Strength with extension of the right knee is minimally decreased compared to left due to pain. No joint effusion, warmth, induration or tenderness. No pain with passive ROM. Mild pain with active extension.   Neurological: He is alert and oriented to person, place, and time.  Reflex Scores:      Patellar reflexes are 2+ on the right side. Ambulates w/o limp  Skin: Skin is warm and dry.  Psychiatric: He has a normal mood and affect.   Nursing note and vitals reviewed.  ED Treatments / Results  Labs (all labs ordered are listed, but only abnormal results are displayed) Labs Reviewed - No data to display  EKG  EKG Interpretation None       Radiology Dg Knee Complete 4 Views Right  Result Date: 11/14/2016 CLINICAL DATA:  Anterior knee pain, worse when bending, hit knee on object several days ago EXAM: RIGHT KNEE - COMPLETE 4+ VIEW COMPARISON:  None. FINDINGS: No acute fracture is seen. For age there is minimal degenerative joint disease with relative preservation of the knee joint spaces. There does appear to be faint chondrocalcinosis present along the lateral compartment and mild CPPD is a consideration. No joint effusion is seen. IMPRESSION: 1. No acute fracture. 2. Very mild degenerative change for age. 3. 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 Electronically Signed   By: Ivar Drape M.D.   On: 11/14/2016 09:27    Procedures Procedures  COORDINATION OF CARE: 8:50 AM Discussed next steps with pt. Pt verbalized understanding and is agreeable with the plan.    Medications Ordered in ED Medications - No data to display   Initial Impression / Assessment and Plan / ED Course  I have reviewed the triage vital signs and the nursing notes.  Pertinent labs & imaging results that were available during my care of the patient were reviewed by me and considered in my medical decision making (see chart for details).  Clinical Course     Likely OA. No e/o septic knee or gout. Plan for supportive care, pcp follow up if not improving.    I personally performed the services described in this documentation, which was scribed in my presence. The recorded information has been reviewed and is accurate.    Final Clinical Impressions(s) / ED Diagnoses   Final diagnoses:  Acute pain of right knee    New Prescriptions Discharge Medication List as of 11/14/2016  9:57 AM    START taking these  medications   Details  ibuprofen (ADVIL,MOTRIN) 400 MG tablet Take 1 tablet (400 mg total) by mouth every 6 (six) hours as needed., Starting Thu 11/14/2016, Print         Merrily Pew, MD 11/15/16 [phone]

## 2017-11-11 ENCOUNTER — Encounter (HOSPITAL_COMMUNITY): Payer: Self-pay | Admitting: Emergency Medicine

## 2017-11-11 ENCOUNTER — Other Ambulatory Visit: Payer: Self-pay

## 2017-11-11 ENCOUNTER — Emergency Department (HOSPITAL_COMMUNITY)
Admission: EM | Admit: 2017-11-11 | Discharge: 2017-11-11 | Disposition: A | Discharge status: Home / Self Care | Payer: Medicare HMO | Attending: Emergency Medicine | Admitting: Emergency Medicine

## 2017-11-11 ENCOUNTER — Emergency Department (HOSPITAL_COMMUNITY): Payer: Medicare HMO

## 2017-11-11 DIAGNOSIS — I10 Essential (primary) hypertension: Secondary | ICD-10-CM | POA: Insufficient documentation

## 2017-11-11 DIAGNOSIS — M79671 Pain in right foot: Secondary | ICD-10-CM | POA: Diagnosis present

## 2017-11-11 DIAGNOSIS — Z79899 Other long term (current) drug therapy: Secondary | ICD-10-CM | POA: Diagnosis not present

## 2017-11-11 DIAGNOSIS — F17210 Nicotine dependence, cigarettes, uncomplicated: Secondary | ICD-10-CM | POA: Diagnosis not present

## 2017-11-11 MED ORDER — NAPROXEN 500 MG PO TABS: 500.0000 mg | ORAL_TABLET | Freq: Two times a day (BID) | ORAL | 0 refills | Status: DC

## 2017-11-11 MED ORDER — TRAMADOL HCL 50 MG PO TABS: 50.0000 mg | ORAL_TABLET | Freq: Four times a day (QID) | ORAL | 0 refills | Status: DC | PRN

## 2017-11-11 NOTE — ED Triage Notes (Signed)
Pt c/o right heel pain x 2 weeks. Took ibuprofen yesterday and helped some. Denies injury. Nad.

## 2017-11-11 NOTE — Discharge Instructions (Signed)
Stop taking the Motrin and follow-up with Dr. Caprice Beaver in 1-2 weeks

## 2017-11-13 NOTE — ED Provider Notes (Signed)
[facility] EMERGENCY DEPARTMENT Provider Note   CSN: [id number] Arrival date & time: 11/11/17  0102     History   Chief Complaint Chief Complaint  Patient presents with  . Foot Pain    HPI Timothy Simpson is a 70 y.o. male.  Patient complains of pain in his right foot   The history is provided by the patient.  Foot Pain  This is a new problem. The current episode started more than 2 days ago. The problem occurs constantly. The problem has not changed since onset.Pertinent negatives include no chest pain, no abdominal pain and no headaches. The symptoms are aggravated by twisting. Nothing relieves the symptoms. He has tried nothing for the symptoms. The treatment provided no relief.    Past Medical History:  Diagnosis Date  . Hypertension     There are no active problems to display for this patient.   Past Surgical History:  Procedure Laterality Date  . BACK SURGERY         Home Medications    Prior to Admission medications   Medication Sig Start Date End Date Taking? Authorizing Provider  CRESTOR 20 MG tablet Take 20 mg by mouth daily. 07/07/14   [provider]  ibuprofen (ADVIL,MOTRIN) 400 MG tablet Take 1 tablet (400 mg total) by mouth every 6 (six) hours as needed. 11/14/16   Mesner, Corene Cornea, MD  metoprolol (LOPRESSOR) 100 MG tablet Take 100 mg by mouth daily. 09/19/14   [provider]  naproxen (NAPROSYN) 500 MG tablet Take 1 tablet (500 mg total) by mouth 2 (two) times daily. 11/11/17   Milton Ferguson, MD  tetrahydrozoline 0.05 % ophthalmic solution Place 1 drop into both eyes daily.    [provider]  traMADol (ULTRAM) 50 MG tablet Take 1 tablet (50 mg total) by mouth every 6 (six) hours as needed. 11/11/17   Milton Ferguson, MD  valsartan-hydrochlorothiazide (DIOVAN-HCT) 160-25 MG per tablet Take 1 tablet by mouth daily. 09/22/14   [provider]    Family History History reviewed. No pertinent family  history.  Social History Social History   Tobacco Use  . Smoking status: Current Every Day Smoker    Packs/day: 1.00    Types: Cigarettes  . Smokeless tobacco: Never Used  Substance Use Topics  . Alcohol use: No  . Drug use: No     Allergies   Patient has no known allergies.   Review of Systems Review of Systems  Constitutional: Negative for appetite change and fatigue.  HENT: Negative for congestion, ear discharge and sinus pressure.   Eyes: Negative for discharge.  Respiratory: Negative for cough.   Cardiovascular: Negative for chest pain.  Gastrointestinal: Negative for abdominal pain and diarrhea.  Genitourinary: Negative for frequency and hematuria.  Musculoskeletal: Negative for back pain.       Pain in right foot  Skin: Negative for rash.  Neurological: Negative for seizures and headaches.  Psychiatric/Behavioral: Negative for hallucinations.     Physical Exam Updated Vital Signs BP (!) 149/97 (BP Location: Left Arm)   Pulse 63   Temp 97.8 F (36.6 C) (Oral)   Resp 18   SpO2 100%   Physical Exam  Constitutional: He is oriented to person, place, and time. He appears well-developed.  HENT:  Head: Normocephalic.  Eyes: Conjunctivae are normal.  Neck: No tracheal deviation present.  Cardiovascular:  No murmur heard. Musculoskeletal: Normal range of motion.  Tenderness to the distal right Achilles tendon neurovascular exam normal  Neurological:  He is oriented to person, place, and time.  Skin: Skin is warm.  Psychiatric: He has a normal mood and affect.     ED Treatments / Results  Labs (all labs ordered are listed, but only abnormal results are displayed) Labs Reviewed - No data to display  EKG  EKG Interpretation None       Radiology No results found.  Procedures Procedures (including critical care time)  Medications Ordered in ED Medications - No data to display   Initial Impression / Assessment and Plan / ED Course  I have  reviewed the triage vital signs and the nursing notes.  Pertinent labs & imaging results that were available during my care of the patient were reviewed by me and considered in my medical decision making (see chart for details).     Inflamed right foot over distal Achilles tendon.  Patient given medicine for discomfort and will follow-up  Final Clinical Impressions(s) / ED Diagnoses   Final diagnoses:  Foot pain, right    ED Discharge Orders        Ordered    naproxen (NAPROSYN) 500 MG tablet  2 times daily     11/11/17 1222    traMADol (ULTRAM) 50 MG tablet  Every 6 hours PRN     11/11/17 1222       Milton Ferguson, MD 11/13/17 1052

## 2018-01-31 ENCOUNTER — Emergency Department (HOSPITAL_COMMUNITY): Payer: Medicare HMO

## 2018-01-31 ENCOUNTER — Encounter (HOSPITAL_COMMUNITY): Payer: Self-pay

## 2018-01-31 ENCOUNTER — Emergency Department (HOSPITAL_COMMUNITY)
Admission: EM | Admit: 2018-01-31 | Discharge: 2018-01-31 | Disposition: A | Discharge status: Home / Self Care | Payer: Medicare HMO | Attending: Emergency Medicine | Admitting: Emergency Medicine

## 2018-01-31 DIAGNOSIS — M79672 Pain in left foot: Secondary | ICD-10-CM | POA: Diagnosis not present

## 2018-01-31 DIAGNOSIS — I10 Essential (primary) hypertension: Secondary | ICD-10-CM | POA: Insufficient documentation

## 2018-01-31 DIAGNOSIS — F17210 Nicotine dependence, cigarettes, uncomplicated: Secondary | ICD-10-CM | POA: Insufficient documentation

## 2018-01-31 DIAGNOSIS — Z79899 Other long term (current) drug therapy: Secondary | ICD-10-CM | POA: Insufficient documentation

## 2018-01-31 MED ORDER — TRAMADOL HCL 50 MG PO TABS: 50.0000 mg | ORAL_TABLET | Freq: Four times a day (QID) | ORAL | 0 refills | Status: DC | PRN

## 2018-01-31 NOTE — Discharge Instructions (Signed)
Wear supportive shoe,  Elevate and Rest foot,  Ice to area of soreness.

## 2018-01-31 NOTE — ED Triage Notes (Signed)
Pt reports pain to inner part of left foot since yesterday.

## 2018-02-01 NOTE — ED Provider Notes (Signed)
[facility] EMERGENCY DEPARTMENT Provider Note   CSN: [id number] Arrival date & time: 01/31/18  0814     History   Chief Complaint Chief Complaint  Patient presents with  . Foot Pain    HPI Timothy Simpson is a 72 y.o. male.  The history is provided by the patient. No language interpreter was used.  Foot Pain  This is a new problem. The problem occurs constantly. The problem has been gradually worsening. Nothing aggravates the symptoms. He has tried nothing for the symptoms. The treatment provided no relief.  Pt reports he was wearing a pair of boots that fit poorly and stepped in an unusual way.  Pt complains of pain to the side of his foot.   Past Medical History:  Diagnosis Date  . Hypertension     There are no active problems to display for this patient.   Past Surgical History:  Procedure Laterality Date  . BACK SURGERY         Home Medications    Prior to Admission medications   Medication Sig Start Date End Date Taking? Authorizing Provider  CRESTOR 20 MG tablet Take 20 mg by mouth daily. 07/07/14   [provider]  ibuprofen (ADVIL,MOTRIN) 400 MG tablet Take 1 tablet (400 mg total) by mouth every 6 (six) hours as needed. 11/14/16   Mesner, Corene Cornea, MD  metoprolol (LOPRESSOR) 100 MG tablet Take 100 mg by mouth daily. 09/19/14   [provider]  naproxen (NAPROSYN) 500 MG tablet Take 1 tablet (500 mg total) by mouth 2 (two) times daily. 11/11/17   Milton Ferguson, MD  tetrahydrozoline 0.05 % ophthalmic solution Place 1 drop into both eyes daily.    [provider]  traMADol (ULTRAM) 50 MG tablet Take 1 tablet (50 mg total) by mouth every 6 (six) hours as needed. 01/31/18   Fransico Meadow, PA-C  valsartan-hydrochlorothiazide (DIOVAN-HCT) 160-25 MG per tablet Take 1 tablet by mouth daily. 09/22/14   [provider]    Family History No family history on file.  Social History Social History   Tobacco Use  . Smoking  status: Current Every Day Smoker    Packs/day: 1.00    Types: Cigarettes  . Smokeless tobacco: Never Used  Substance Use Topics  . Alcohol use: No  . Drug use: No     Allergies   Patient has no known allergies.   Review of Systems Review of Systems  All other systems reviewed and are negative.    Physical Exam Updated Vital Signs BP (!) 136/54   Pulse 62   Temp 97.8 F (36.6 C) (Oral)   Resp 16   Ht 6\' 2"  (1.88 m)   Wt 93.4 kg (206 lb)   SpO2 100%   BMI 26.45 kg/m   Physical Exam  Constitutional: He appears well-developed and well-nourished.  Musculoskeletal: He exhibits tenderness and deformity.  Tender lateral foot below mtp joint, nv and ns intact  Neurological: He is alert.  Skin: Skin is warm.  Psychiatric: He has a normal mood and affect.  Nursing note and vitals reviewed.    ED Treatments / Results  Labs (all labs ordered are listed, but only abnormal results are displayed) Labs Reviewed - No data to display  EKG  EKG Interpretation None       Radiology Dg Foot Complete Left  Result Date: 01/31/2018 CLINICAL DATA:  Left foot Pain since yesterday, pain at proximal 1st metatarsal area, no injury EXAM: LEFT FOOT -  COMPLETE 3+ VIEW COMPARISON:  None. FINDINGS: No fracture.  No bone lesion. The joints are normally spaced and aligned. There are small marginal osteophytes at the first metatarsophalangeal joint. No other degenerative/arthropathic change. Small dorsal and plantar calcaneal spurs. Soft tissues are unremarkable. IMPRESSION: 1. No fracture, bone lesion or acute finding. 2. Minor first metatarsophalangeal joint osteoarthritis. Electronically Signed   By: Lajean Manes M.D.   On: 01/31/2018 09:44    Procedures Procedures (including critical care time)  Medications Ordered in ED Medications - No data to display   Initial Impression / Assessment and Plan / ED Course  I have reviewed the triage vital signs and the nursing notes.  Pertinent  labs & imaging results that were available during my care of the patient were reviewed by me and considered in my medical decision making (see chart for details).     MDM   Xray reviewed with pt. Pt advised elevate foot, follow up with MD for recheck,   Meds ordered this encounter  Medications  . traMADol (ULTRAM) 50 MG tablet    Sig: Take 1 tablet (50 mg total) by mouth every 6 (six) hours as needed.    Dispense:  12 tablet    Refill:  0    Order Specific Question:   Supervising Provider    Answer:   Noemi Chapel [[id number]]    Final Clinical Impressions(s) / ED Diagnoses   Final diagnoses:  Foot pain, left    ED Discharge Orders        Ordered    traMADol (ULTRAM) 50 MG tablet  Every 6 hours PRN     01/31/18 1006    An After Visit Summary was printed and given to the patient.    Fransico Meadow, PA-C 02/01/18 0900    Daleen Bo, MD 02/01/18 [phone]

## 2018-04-01 ENCOUNTER — Emergency Department (HOSPITAL_COMMUNITY): Payer: Medicare HMO

## 2018-04-01 ENCOUNTER — Emergency Department (HOSPITAL_COMMUNITY)
Admission: EM | Admit: 2018-04-01 | Discharge: 2018-04-01 | Disposition: A | Discharge status: Home / Self Care | Payer: Medicare HMO | Attending: Emergency Medicine | Admitting: Emergency Medicine

## 2018-04-01 ENCOUNTER — Other Ambulatory Visit: Payer: Self-pay

## 2018-04-01 ENCOUNTER — Encounter (HOSPITAL_COMMUNITY): Payer: Self-pay | Admitting: Emergency Medicine

## 2018-04-01 DIAGNOSIS — M79674 Pain in right toe(s): Secondary | ICD-10-CM

## 2018-04-01 DIAGNOSIS — F17210 Nicotine dependence, cigarettes, uncomplicated: Secondary | ICD-10-CM | POA: Diagnosis not present

## 2018-04-01 DIAGNOSIS — Z79899 Other long term (current) drug therapy: Secondary | ICD-10-CM | POA: Insufficient documentation

## 2018-04-01 DIAGNOSIS — I10 Essential (primary) hypertension: Secondary | ICD-10-CM | POA: Insufficient documentation

## 2018-04-01 MED ORDER — HYDROCODONE-ACETAMINOPHEN 5-325 MG PO TABS: 1.0000 | ORAL_TABLET | Freq: Four times a day (QID) | ORAL | 0 refills | Status: DC | PRN

## 2018-04-01 MED ORDER — NAPROXEN 500 MG PO TABS: 500.0000 mg | ORAL_TABLET | Freq: Two times a day (BID) | ORAL | 0 refills | Status: DC

## 2018-04-01 MED ORDER — HYDROCODONE-ACETAMINOPHEN 5-325 MG PO TABS
1.0000 | ORAL_TABLET | Freq: Once | ORAL | Status: AC
  Administered 2018-04-01: 1 via ORAL
  Filled 2018-04-01: qty 1

## 2018-04-01 NOTE — Discharge Instructions (Addendum)
You were seen today for pain in the right great toe.  Your x-rays are negative.  This may be related to gout.  Read enclosed information.  Continue naproxen twice daily.  Wear good fitting shoes.  Take Norco only as needed for breakthrough pain.  Do not drive or operate heavy machinery with Norco.

## 2018-04-01 NOTE — ED Triage Notes (Signed)
Pt c/o right great toe pain x 2 days. Pt states he has taken naproxen with no relief.

## 2018-04-01 NOTE — ED Provider Notes (Signed)
[facility] Provider Note   CSN: [id number] Arrival date & time: 04/01/18  0308     History   Chief Complaint Chief Complaint  Patient presents with  . Toe Pain    HPI Timothy Simpson is a 72 y.o. male.  HPI  This is a 72 year old male with a history of hypertension who presents with right great toe pain.  Patient reports onset of atraumatic right great toe pain over the last 2 to 3 days.  It is worse with ambulation and movement of the great toe.  He denies any injury.  He has taken naproxen twice with minimal relief.  He denies any history of gout.  He does eat work.  He states that he no longer drinks.  He denies any fevers or infectious symptoms.  Currently he rates his pain at 8 out of 10.  Past Medical History:  Diagnosis Date  . Hypertension     There are no active problems to display for this patient.   Past Surgical History:  Procedure Laterality Date  . BACK SURGERY          Home Medications    Prior to Admission medications   Medication Sig Start Date End Date Taking? Authorizing Provider  CRESTOR 20 MG tablet Take 20 mg by mouth daily. 07/07/14  Yes [provider]  metoprolol (LOPRESSOR) 100 MG tablet Take 100 mg by mouth daily. 09/19/14  Yes [provider]  naproxen (NAPROSYN) 500 MG tablet Take 1 tablet (500 mg total) by mouth 2 (two) times daily. 11/11/17  Yes Milton Ferguson, MD  valsartan-hydrochlorothiazide (DIOVAN-HCT) 160-25 MG per tablet Take 1 tablet by mouth daily. 09/22/14  Yes [provider]  HYDROcodone-acetaminophen (NORCO/VICODIN) 5-325 MG tablet Take 1 tablet by mouth every 6 (six) hours as needed. 04/01/18   Yarden Manuelito, Barbette Hair, MD  ibuprofen (ADVIL,MOTRIN) 400 MG tablet Take 1 tablet (400 mg total) by mouth every 6 (six) hours as needed. 11/14/16   Mesner, Corene Cornea, MD  naproxen (NAPROSYN) 500 MG tablet Take 1 tablet (500 mg total) by mouth 2 (two) times daily. 04/01/18   Tyffany Waldrop, Barbette Hair, MD   tetrahydrozoline 0.05 % ophthalmic solution Place 1 drop into both eyes daily.    [provider]  traMADol (ULTRAM) 50 MG tablet Take 1 tablet (50 mg total) by mouth every 6 (six) hours as needed. 01/31/18   Fransico Meadow, PA-C    Family History No family history on file.  Social History Social History   Tobacco Use  . Smoking status: Current Every Day Smoker    Packs/day: 1.00    Types: Cigarettes  . Smokeless tobacco: Never Used  Substance Use Topics  . Alcohol use: No  . Drug use: No     Allergies   Patient has no known allergies.   Review of Systems Review of Systems  Musculoskeletal:       Toe pain  Skin: Negative for color change.  All other systems reviewed and are negative.    Physical Exam Updated Vital Signs BP (!) 154/93 (BP Location: Right Arm)   Pulse 83   Temp 97.7 F (36.5 C) (Oral)   Resp 18   Ht 6' (1.829 m)   Wt 94.8 kg (209 lb)   SpO2 98%   BMI 28.35 kg/m   Physical Exam  Constitutional: He is oriented to person, place, and time. He appears well-developed and well-nourished. No distress.  HENT:  Head: Normocephalic and atraumatic.  Cardiovascular:  Normal rate and regular rhythm.  Pulmonary/Chest: Effort normal. No respiratory distress.  Musculoskeletal:  Focused examination of the right foot reveals slight swelling and tenderness palpation over the right MTP joint, normal range of motion, no overlying skin changes or erythema, no warmth, 2+ DP pulse, no deformities  Neurological: He is alert and oriented to person, place, and time.  Skin: Skin is warm and dry.  Psychiatric: He has a normal mood and affect.  Nursing note and vitals reviewed.    ED Treatments / Results  Labs (all labs ordered are listed, but only abnormal results are displayed) Labs Reviewed - No data to display  EKG None  Radiology Dg Toe Great Right  Result Date: 04/01/2018 CLINICAL DATA:  Right great toe pain with swelling for 2 days. No known  injury. EXAM: RIGHT GREAT TOE COMPARISON:  Right foot 11/11/2017 FINDINGS: Degenerative changes in the first metatarsal-phalangeal joint. No evidence of acute fracture or dislocation of the right first toe. No focal bone lesion or bone destruction. Soft tissues are unremarkable. No significant change since previous study. IMPRESSION: Degenerative changes of the first metatarsal-phalangeal joint. No acute bony abnormalities. Electronically Signed   By: Lucienne Capers M.D.   On: 04/01/2018 04:13    Procedures Procedures (including critical care time)  Medications Ordered in ED Medications  HYDROcodone-acetaminophen (NORCO/VICODIN) 5-325 MG per tablet 1 tablet (1 tablet Oral Given 04/01/18 0411)     Initial Impression / Assessment and Plan / ED Course  I have reviewed the triage vital signs and the nursing notes.  Pertinent labs & imaging results that were available during my care of the patient were reviewed by me and considered in my medical decision making (see chart for details).     Patient presents with atraumatic right great toe pain.  He is nontoxic.  Vital signs reassuring.  Doubt infectious source.  Doubt injury given history; however, will obtain x-ray to further evaluate.  Patient was given Norco.  X-rays negative for deformities.  Given location, gout would be consideration.  Continue naproxen twice daily.  Patient was given a short course of Norco for breakthrough pain.  After history, exam, and medical workup I feel the patient has been appropriately medically screened and is safe for discharge home. Pertinent diagnoses were discussed with the patient. Patient was given return precautions.   Final Clinical Impressions(s) / ED Diagnoses   Final diagnoses:  Pain of toe of right foot    ED Discharge Orders        Ordered    HYDROcodone-acetaminophen (NORCO/VICODIN) 5-325 MG tablet  Every 6 hours PRN     04/01/18 0418    naproxen (NAPROSYN) 500 MG tablet  2 times daily      04/01/18 0418       Nakayla Rorabaugh, Barbette Hair, MD 04/01/18 [phone]

## 2018-06-26 ENCOUNTER — Emergency Department (HOSPITAL_COMMUNITY)
Admission: EM | Admit: 2018-06-26 | Discharge: 2018-06-26 | Disposition: A | Discharge status: Home / Self Care | Payer: Medicare HMO | Attending: Emergency Medicine | Admitting: Emergency Medicine

## 2018-06-26 ENCOUNTER — Other Ambulatory Visit: Payer: Self-pay

## 2018-06-26 ENCOUNTER — Encounter (HOSPITAL_COMMUNITY): Payer: Self-pay | Admitting: Emergency Medicine

## 2018-06-26 DIAGNOSIS — I10 Essential (primary) hypertension: Secondary | ICD-10-CM | POA: Diagnosis not present

## 2018-06-26 DIAGNOSIS — M109 Gout, unspecified: Secondary | ICD-10-CM | POA: Diagnosis not present

## 2018-06-26 DIAGNOSIS — M79671 Pain in right foot: Secondary | ICD-10-CM | POA: Diagnosis present

## 2018-06-26 DIAGNOSIS — F17210 Nicotine dependence, cigarettes, uncomplicated: Secondary | ICD-10-CM | POA: Diagnosis not present

## 2018-06-26 DIAGNOSIS — Z79899 Other long term (current) drug therapy: Secondary | ICD-10-CM | POA: Insufficient documentation

## 2018-06-26 HISTORY — DX: Pure hypercholesterolemia, unspecified: E78.00

## 2018-06-26 HISTORY — DX: Gout, unspecified: M10.9

## 2018-06-26 MED ORDER — TRAMADOL HCL 50 MG PO TABS: 50.0000 mg | ORAL_TABLET | Freq: Four times a day (QID) | ORAL | 0 refills | Status: DC | PRN

## 2018-06-26 MED ORDER — PREDNISONE 10 MG PO TABS
60.0000 mg | ORAL_TABLET | Freq: Once | ORAL | Status: AC
  Administered 2018-06-26: 60 mg via ORAL
  Filled 2018-06-26: qty 1

## 2018-06-26 MED ORDER — TRAMADOL HCL 50 MG PO TABS
50.0000 mg | ORAL_TABLET | Freq: Once | ORAL | Status: AC
  Administered 2018-06-26: 50 mg via ORAL
  Filled 2018-06-26: qty 1

## 2018-06-26 MED ORDER — PREDNISONE 20 MG PO TABS: ORAL_TABLET | ORAL | 0 refills | Status: DC

## 2018-06-26 NOTE — ED Provider Notes (Signed)
[facility] EMERGENCY DEPARTMENT Provider Note   CSN: [id number] Arrival date & time: 06/26/18  1351     History   Chief Complaint Chief Complaint  Patient presents with  . Foot Pain    HPI Timothy Simpson is a 72 y.o. male.  Timothy Simpson is a 72 y.o. Male with history of hypertension, hyperlipidemia and gout, who presents to the ED for evaluation of pain, swelling and redness surrounding the first MTP joint of the right foot.  Patient reports symptoms started suddenly last night and have been constant and worsening since onset.  He denies any preceding injury or trauma to the foot.  Reports history of similar episode a few months ago in which she was seen here, had x-rays done and was diagnosed with gout.  His primary care doctor has started him on allopurinol which she has been taking regularly, and this is the first time he has had a flare since then, allopurinol has not been helping his pain and he has not tried anything else.  No associated fever, no similar pain in any other joints, redness has not been spreading.      Past Medical History:  Diagnosis Date  . Gout   . High cholesterol   . Hypertension     There are no active problems to display for this patient.   Past Surgical History:  Procedure Laterality Date  . BACK SURGERY          Home Medications    Prior to Admission medications   Medication Sig Start Date End Date Taking? Authorizing Provider  CRESTOR 20 MG tablet Take 20 mg by mouth daily. 07/07/14   [provider]  HYDROcodone-acetaminophen (NORCO/VICODIN) 5-325 MG tablet Take 1 tablet by mouth every 6 (six) hours as needed. 04/01/18   Horton, Barbette Hair, MD  ibuprofen (ADVIL,MOTRIN) 400 MG tablet Take 1 tablet (400 mg total) by mouth every 6 (six) hours as needed. 11/14/16   Mesner, Corene Cornea, MD  metoprolol (LOPRESSOR) 100 MG tablet Take 100 mg by mouth daily. 09/19/14   [provider]  naproxen (NAPROSYN) 500 MG tablet  Take 1 tablet (500 mg total) by mouth 2 (two) times daily. 11/11/17   Milton Ferguson, MD  naproxen (NAPROSYN) 500 MG tablet Take 1 tablet (500 mg total) by mouth 2 (two) times daily. 04/01/18   Horton, Barbette Hair, MD  predniSONE (DELTASONE) 20 MG tablet 3 tabs po daily x 3 days, then 2 tabs x 3 days, then 1.5 tabs x 3 days, then 1 tab x 3 days, then 0.5 tabs x 3 days 06/26/18   Jacqlyn Larsen, PA-C  tetrahydrozoline 0.05 % ophthalmic solution Place 1 drop into both eyes daily.    [provider]  traMADol (ULTRAM) 50 MG tablet Take 1 tablet (50 mg total) by mouth every 6 (six) hours as needed. 06/26/18   Jacqlyn Larsen, PA-C  valsartan-hydrochlorothiazide (DIOVAN-HCT) 160-25 MG per tablet Take 1 tablet by mouth daily. 09/22/14   [provider]    Family History History reviewed. No pertinent family history.  Social History Social History   Tobacco Use  . Smoking status: Current Every Day Smoker    Packs/day: 1.00    Types: Cigarettes  . Smokeless tobacco: Never Used  Substance Use Topics  . Alcohol use: No  . Drug use: No     Allergies   Patient has no known allergies.   Review of Systems Review of Systems  Constitutional: Negative for  chills and fever.  Musculoskeletal: Positive for arthralgias and joint swelling.  Skin: Positive for color change. Negative for rash and wound.  Neurological: Positive for numbness. Negative for weakness.     Physical Exam Updated Vital Signs BP (!) 153/76 (BP Location: Right Arm)   Pulse 86   Temp 98 F (36.7 C) (Oral)   Resp 18   Ht 6' (1.829 m)   Wt 95.3 kg (210 lb)   SpO2 99%   BMI 28.48 kg/m   Physical Exam  Constitutional: He appears well-developed and well-nourished. No distress.  HENT:  Head: Normocephalic and atraumatic.  Eyes: Right eye exhibits no discharge. Left eye exhibits no discharge.  Pulmonary/Chest: Effort normal. No respiratory distress.  Musculoskeletal:  Erythema, swelling and warmth  surrounding the first MTP joint of the right foot, redness does not spread across the foot or up the leg, 2+ DP and TP pulses, range of motion intact although pain when moving the great toe.  Normal dorsi and plantar flexion.  Neurological: He is alert. Coordination normal.  Skin: Skin is warm and dry. He is not diaphoretic.  Psychiatric: He has a normal mood and affect. His behavior is normal.  Nursing note and vitals reviewed.    ED Treatments / Results  Labs (all labs ordered are listed, but only abnormal results are displayed) Labs Reviewed - No data to display  EKG None  Radiology No results found.  Procedures Procedures (including critical care time)  Medications Ordered in ED Medications  traMADol (ULTRAM) tablet 50 mg (50 mg Oral Given 06/26/18 1534)  predniSONE (DELTASONE) tablet 60 mg (60 mg Oral Given 06/26/18 1535)     Initial Impression / Assessment and Plan / ED Course  I have reviewed the triage vital signs and the nursing notes.  Pertinent labs & imaging results that were available during my care of the patient were reviewed by me and considered in my medical decision making (see chart for details).  Patient presents for right foot pain, redness swelling and warmth surrounding the first MTP joint most consistent with gout, patient has history of same and his gout has always been present in this joint, he has been taking allopurinol but has not tried anything for acute flare of symptoms.  No fevers or spreading redness to suggest cellulitis or septic joint.  Will treat with prednisone taper and tramadol and have patient follow with primary care doctor.  Return precautions discussed and patient expresses understanding and is in agreement with plan.  Case discussed with Dr. Shaune Spittle who saw and evaluated the patient as well and is in agreement with plan.  Final Clinical Impressions(s) / ED Diagnoses   Final diagnoses:  Acute gout involving toe of right foot,  unspecified cause    ED Discharge Orders        Ordered    predniSONE (DELTASONE) 20 MG tablet     06/26/18 1535    traMADol (ULTRAM) 50 MG tablet  Every 6 hours PRN     06/26/18 1535       Jacqlyn Larsen, [place] 06/26/18 1733    Mesner, Corene Cornea, MD 06/27/18 0998    Merrily Pew, MD 06/27/18 0045

## 2018-06-26 NOTE — ED Provider Notes (Signed)
Medical screening examination/treatment/procedure(s) were conducted as a shared visit with non-physician practitioner(s) and myself.  I personally evaluated the patient during the encounter.  72 year old male with a history of gout here with a gout flare of his right first MTP joint.  Being off for couple days. His significant swelling, tenderness mild warmth over his right first MTP.  No fever, nausea, vomiting, weakness or other signs of systemic symptoms.  Low suspicion of septic arthritis.  We will treat his gout flare with PCP follow-up.  States compliance with allopurinol may need a different medication.   Eleanna Theilen, Corene Cornea, MD 06/27/18 [phone]

## 2018-06-26 NOTE — ED Notes (Signed)
R foot slightly swollen and reddened at inner lateral upper foot

## 2018-06-26 NOTE — ED Notes (Addendum)
R foot pain around a ? Bunion  Dx Gout a year ago here per pt report  Pt has hx of gout and meds are not helping  Went by PCP office today to see if could be evaluated and they were closed

## 2018-06-26 NOTE — Discharge Instructions (Signed)
Please take steroid course as directed for the next 2 weeks, this has been E prescribed to your pharmacy in [place].  You may use tramadol for pain.  Please follow-up next week with your primary care doctor for recheck.  Return to the emergency department if you do not see improvement in your pain over the next 2 to 3 days, have fevers or redness spreading beyond your big toe joint up your foot.

## 2018-06-26 NOTE — ED Triage Notes (Signed)
Patient complaining of right foot pain. States he was diagnosed with gout and is taking allopurinol but it is not helping.

## 2018-07-28 ENCOUNTER — Encounter (HOSPITAL_COMMUNITY): Payer: Self-pay | Admitting: *Deleted

## 2018-07-28 ENCOUNTER — Inpatient Hospital Stay (HOSPITAL_COMMUNITY)
Admission: EM | Admit: 2018-07-28 | Discharge: 2018-08-11 | DRG: 234 | Disposition: A | Discharge status: Home / Self Care | Payer: Medicare HMO | Attending: Thoracic Surgery (Cardiothoracic Vascular Surgery) | Admitting: Thoracic Surgery (Cardiothoracic Vascular Surgery)

## 2018-07-28 ENCOUNTER — Other Ambulatory Visit: Payer: Self-pay

## 2018-07-28 ENCOUNTER — Emergency Department (HOSPITAL_COMMUNITY): Payer: Medicare HMO

## 2018-07-28 DIAGNOSIS — D62 Acute posthemorrhagic anemia: Secondary | ICD-10-CM | POA: Diagnosis not present

## 2018-07-28 DIAGNOSIS — I213 ST elevation (STEMI) myocardial infarction of unspecified site: Principal | ICD-10-CM | POA: Diagnosis present

## 2018-07-28 DIAGNOSIS — I10 Essential (primary) hypertension: Secondary | ICD-10-CM | POA: Diagnosis present

## 2018-07-28 DIAGNOSIS — Z4682 Encounter for fitting and adjustment of non-vascular catheter: Secondary | ICD-10-CM

## 2018-07-28 DIAGNOSIS — R000 Tachycardia, unspecified: Secondary | ICD-10-CM | POA: Diagnosis not present

## 2018-07-28 DIAGNOSIS — I25110 Atherosclerotic heart disease of native coronary artery with unstable angina pectoris: Secondary | ICD-10-CM | POA: Diagnosis present

## 2018-07-28 DIAGNOSIS — D696 Thrombocytopenia, unspecified: Secondary | ICD-10-CM | POA: Diagnosis not present

## 2018-07-28 DIAGNOSIS — E7800 Pure hypercholesterolemia, unspecified: Secondary | ICD-10-CM | POA: Diagnosis present

## 2018-07-28 DIAGNOSIS — I214 Non-ST elevation (NSTEMI) myocardial infarction: Secondary | ICD-10-CM | POA: Diagnosis present

## 2018-07-28 DIAGNOSIS — F17210 Nicotine dependence, cigarettes, uncomplicated: Secondary | ICD-10-CM | POA: Diagnosis present

## 2018-07-28 DIAGNOSIS — J9811 Atelectasis: Secondary | ICD-10-CM

## 2018-07-28 DIAGNOSIS — M109 Gout, unspecified: Secondary | ICD-10-CM | POA: Diagnosis present

## 2018-07-28 DIAGNOSIS — R079 Chest pain, unspecified: Secondary | ICD-10-CM

## 2018-07-28 DIAGNOSIS — I081 Rheumatic disorders of both mitral and tricuspid valves: Secondary | ICD-10-CM | POA: Diagnosis present

## 2018-07-28 DIAGNOSIS — Z951 Presence of aortocoronary bypass graft: Secondary | ICD-10-CM

## 2018-07-28 DIAGNOSIS — E785 Hyperlipidemia, unspecified: Secondary | ICD-10-CM | POA: Diagnosis present

## 2018-07-28 DIAGNOSIS — I2510 Atherosclerotic heart disease of native coronary artery without angina pectoris: Secondary | ICD-10-CM | POA: Diagnosis present

## 2018-07-28 HISTORY — DX: Tobacco use: Z72.0

## 2018-07-28 HISTORY — DX: Non-ST elevation (NSTEMI) myocardial infarction: I21.4

## 2018-07-28 HISTORY — DX: Atherosclerotic heart disease of native coronary artery without angina pectoris: I25.10

## 2018-07-28 HISTORY — DX: Presence of aortocoronary bypass graft: Z95.1

## 2018-07-28 LAB — BASIC METABOLIC PANEL
Anion gap: 8 (ref 5–15)
BUN: 8 mg/dL (ref 8–23)
CHLORIDE: 102 mmol/L (ref 98–111)
CO2: 31 mmol/L (ref 22–32)
Calcium: 9.4 mg/dL (ref 8.9–10.3)
Creatinine, Ser: 1.15 mg/dL (ref 0.61–1.24)
GFR calc Af Amer: >60 mL/min (ref >60)
GFR calc non Af Amer: >60 mL/min (ref >60)
Glucose, Bld: 124 mg/dL — ABNORMAL HIGH (ref 70–99)
POTASSIUM: 3.9 mmol/L (ref 3.5–5.1)
Sodium: 141 mmol/L (ref 135–145)

## 2018-07-28 LAB — CBC
HEMATOCRIT: 41.9 % (ref 39.0–52.0)
Hemoglobin: 14 g/dL (ref 13.0–17.0)
MCH: 29.9 pg (ref 26.0–34.0)
MCHC: 33.4 g/dL (ref 30.0–36.0)
MCV: 89.3 fL (ref 78.0–100.0)
Platelets: 206 10*3/uL (ref 150–400)
RBC: 4.69 MIL/uL (ref 4.22–5.81)
RDW: 13.3 % (ref 11.5–15.5)
WBC: 5.5 10*3/uL (ref 4.0–10.5)

## 2018-07-28 LAB — TROPONIN I: Troponin I: <0.03 ng/mL (ref <0.03)

## 2018-07-28 NOTE — ED Triage Notes (Signed)
Pt with mid chest pain after eating dinner, pt belching in triage, states pain better after belching.

## 2018-07-28 NOTE — ED Triage Notes (Signed)
ekg handed to Dr. Wilson Singer

## 2018-07-29 ENCOUNTER — Inpatient Hospital Stay (HOSPITAL_COMMUNITY): Payer: Medicare HMO

## 2018-07-29 ENCOUNTER — Other Ambulatory Visit: Payer: Self-pay

## 2018-07-29 DIAGNOSIS — D696 Thrombocytopenia, unspecified: Secondary | ICD-10-CM | POA: Diagnosis not present

## 2018-07-29 DIAGNOSIS — I25110 Atherosclerotic heart disease of native coronary artery with unstable angina pectoris: Secondary | ICD-10-CM | POA: Diagnosis not present

## 2018-07-29 DIAGNOSIS — E782 Mixed hyperlipidemia: Secondary | ICD-10-CM | POA: Diagnosis not present

## 2018-07-29 DIAGNOSIS — I2510 Atherosclerotic heart disease of native coronary artery without angina pectoris: Secondary | ICD-10-CM | POA: Diagnosis not present

## 2018-07-29 DIAGNOSIS — D62 Acute posthemorrhagic anemia: Secondary | ICD-10-CM | POA: Diagnosis not present

## 2018-07-29 DIAGNOSIS — E7800 Pure hypercholesterolemia, unspecified: Secondary | ICD-10-CM | POA: Diagnosis not present

## 2018-07-29 DIAGNOSIS — I081 Rheumatic disorders of both mitral and tricuspid valves: Secondary | ICD-10-CM | POA: Diagnosis not present

## 2018-07-29 DIAGNOSIS — I213 ST elevation (STEMI) myocardial infarction of unspecified site: Secondary | ICD-10-CM | POA: Diagnosis present

## 2018-07-29 DIAGNOSIS — I214 Non-ST elevation (NSTEMI) myocardial infarction: Secondary | ICD-10-CM | POA: Diagnosis present

## 2018-07-29 DIAGNOSIS — I10 Essential (primary) hypertension: Secondary | ICD-10-CM | POA: Diagnosis present

## 2018-07-29 DIAGNOSIS — E785 Hyperlipidemia, unspecified: Secondary | ICD-10-CM

## 2018-07-29 DIAGNOSIS — M109 Gout, unspecified: Secondary | ICD-10-CM | POA: Diagnosis not present

## 2018-07-29 DIAGNOSIS — Z01810 Encounter for preprocedural cardiovascular examination: Secondary | ICD-10-CM | POA: Diagnosis not present

## 2018-07-29 DIAGNOSIS — I340 Nonrheumatic mitral (valve) insufficiency: Secondary | ICD-10-CM | POA: Diagnosis not present

## 2018-07-29 DIAGNOSIS — I361 Nonrheumatic tricuspid (valve) insufficiency: Secondary | ICD-10-CM

## 2018-07-29 DIAGNOSIS — F17210 Nicotine dependence, cigarettes, uncomplicated: Secondary | ICD-10-CM | POA: Diagnosis not present

## 2018-07-29 DIAGNOSIS — Z720 Tobacco use: Secondary | ICD-10-CM

## 2018-07-29 DIAGNOSIS — R000 Tachycardia, unspecified: Secondary | ICD-10-CM | POA: Diagnosis not present

## 2018-07-29 HISTORY — DX: Non-ST elevation (NSTEMI) myocardial infarction: I21.4

## 2018-07-29 LAB — HEPARIN LEVEL (UNFRACTIONATED)
Heparin Unfractionated: 0.42 IU/mL (ref 0.30–0.70)
Heparin Unfractionated: 0.39 [IU]/mL (ref 0.30–0.70)

## 2018-07-29 LAB — PROTIME-INR
INR: 0.97
PROTHROMBIN TIME: 12.8 s (ref 11.4–15.2)

## 2018-07-29 LAB — APTT: APTT: 30 s (ref 24–36)

## 2018-07-29 LAB — ECHOCARDIOGRAM COMPLETE
Height: 73 in
Weight: 3110.4 [oz_av]

## 2018-07-29 LAB — TROPONIN I
TROPONIN I: 0.29 ng/mL — AB (ref <0.03)
TROPONIN I: 2.75 ng/mL — AB (ref <0.03)
TROPONIN I: 3.65 ng/mL — AB (ref <0.03)
Troponin I: 1.28 ng/mL (ref <0.03)

## 2018-07-29 LAB — HIV ANTIBODY (ROUTINE TESTING W REFLEX): HIV Screen 4th Generation wRfx: NONREACTIVE

## 2018-07-29 MED ORDER — NITROGLYCERIN 0.4 MG SL SUBL: 0.4000 mg | SUBLINGUAL_TABLET | SUBLINGUAL | Status: DC | PRN

## 2018-07-29 MED ORDER — HEPARIN BOLUS VIA INFUSION
4000.0000 [IU] | Freq: Once | INTRAVENOUS | Status: AC
  Administered 2018-07-29: 4000 [IU] via INTRAVENOUS

## 2018-07-29 MED ORDER — SODIUM CHLORIDE 0.9% FLUSH: 3.0000 mL | INTRAVENOUS | Status: DC | PRN

## 2018-07-29 MED ORDER — HEPARIN (PORCINE) IN NACL 100-0.45 UNIT/ML-% IJ SOLN
1200.0000 [IU]/h | INTRAMUSCULAR | Status: DC
  Administered 2018-07-29 – 2018-07-30 (×3): 1200 [IU]/h via INTRAVENOUS
  Filled 2018-07-29 (×2): qty 250

## 2018-07-29 MED ORDER — ACETAMINOPHEN 325 MG PO TABS: 650.0000 mg | ORAL_TABLET | ORAL | Status: DC | PRN

## 2018-07-29 MED ORDER — SODIUM CHLORIDE 0.9 % IV SOLN: 250.0000 mL | INTRAVENOUS | Status: DC | PRN

## 2018-07-29 MED ORDER — METOPROLOL TARTRATE 12.5 MG HALF TABLET
12.5000 mg | ORAL_TABLET | Freq: Once | ORAL | Status: AC
  Administered 2018-07-29: 12.5 mg via ORAL
  Filled 2018-07-29: qty 1

## 2018-07-29 MED ORDER — CARVEDILOL 6.25 MG PO TABS
6.2500 mg | ORAL_TABLET | Freq: Two times a day (BID) | ORAL | Status: DC
  Administered 2018-07-29: 6.25 mg via ORAL
  Filled 2018-07-29: qty 1

## 2018-07-29 MED ORDER — SODIUM CHLORIDE 0.9% FLUSH: 3.0000 mL | Freq: Two times a day (BID) | INTRAVENOUS | Status: DC

## 2018-07-29 MED ORDER — ASPIRIN 81 MG PO CHEW
324.0000 mg | CHEWABLE_TABLET | Freq: Once | ORAL | Status: AC
  Administered 2018-07-29: 324 mg via ORAL
  Filled 2018-07-29: qty 4

## 2018-07-29 MED ORDER — ALLOPURINOL 100 MG PO TABS
100.0000 mg | ORAL_TABLET | Freq: Every day | ORAL | Status: DC
  Administered 2018-07-29 – 2018-08-11 (×13): 100 mg via ORAL
  Filled 2018-07-29 (×13): qty 1

## 2018-07-29 MED ORDER — SODIUM CHLORIDE 0.9 % WEIGHT BASED INFUSION
3.0000 mL/kg/h | INTRAVENOUS | Status: DC
  Administered 2018-07-30: 3 mL/kg/h via INTRAVENOUS

## 2018-07-29 MED ORDER — ASPIRIN EC 81 MG PO TBEC
81.0000 mg | DELAYED_RELEASE_TABLET | Freq: Every day | ORAL | Status: DC
  Administered 2018-07-31 – 2018-08-04 (×5): 81 mg via ORAL
  Filled 2018-07-29 (×5): qty 1

## 2018-07-29 MED ORDER — ONDANSETRON HCL 4 MG/2ML IJ SOLN: 4.0000 mg | Freq: Four times a day (QID) | INTRAMUSCULAR | Status: DC | PRN

## 2018-07-29 MED ORDER — SODIUM CHLORIDE 0.9 % WEIGHT BASED INFUSION
1.0000 mL/kg/h | INTRAVENOUS | Status: DC
  Administered 2018-07-30: 1 mL/kg/h via INTRAVENOUS

## 2018-07-29 MED ORDER — POVIDONE-IODINE 10 % EX SOLN
CUTANEOUS | Status: AC
  Filled 2018-07-29: qty 15

## 2018-07-29 MED ORDER — METOPROLOL TARTRATE 25 MG PO TABS: 25.0000 mg | ORAL_TABLET | Freq: Two times a day (BID) | ORAL | Status: DC

## 2018-07-29 MED ORDER — METOPROLOL TARTRATE 12.5 MG HALF TABLET
12.5000 mg | ORAL_TABLET | Freq: Two times a day (BID) | ORAL | Status: DC
  Administered 2018-07-29: 12.5 mg via ORAL
  Filled 2018-07-29: qty 1

## 2018-07-29 MED ORDER — IRBESARTAN 150 MG PO TABS
150.0000 mg | ORAL_TABLET | Freq: Every day | ORAL | Status: DC
  Administered 2018-07-29 – 2018-08-04 (×7): 150 mg via ORAL
  Filled 2018-07-29 (×7): qty 1

## 2018-07-29 MED ORDER — ATORVASTATIN CALCIUM 80 MG PO TABS
80.0000 mg | ORAL_TABLET | Freq: Every day | ORAL | Status: DC
  Administered 2018-07-29 – 2018-08-10 (×13): 80 mg via ORAL
  Filled 2018-07-29 (×13): qty 1

## 2018-07-29 NOTE — ED Notes (Signed)
Date and time results received: 07/29/18 0199 (use smartphrase ".now" to insert current time)  Test: Tropinin Critical Value: 0.29  Name of Provider Notified: Dr Kathrynn Humble  Orders Received? Or Actions Taken?:Md Notified

## 2018-07-29 NOTE — ED Notes (Addendum)
Labs will need to be redrawn.

## 2018-07-29 NOTE — Progress Notes (Signed)
  Echocardiogram 2D Echocardiogram has been performed.  Timothy Simpson Timothy Simpson 07/29/2018, 1:21 PM

## 2018-07-29 NOTE — ED Provider Notes (Addendum)
Timothy Simpson   CSN: [id number] Arrival date & time: 07/28/18  2117     History   Chief Complaint Chief Complaint  Patient presents with  . Chest Pain    HPI Timothy Simpson is a 72 y.o. male.  HPI 72 year old male comes in with chief complaint of chest pain. Patient has history of hypertension, hyperlipidemia and smokes 1 pack a day.  He reports that after dinner around 7 PM he started having generalized epigastric discomfort.  Patient felt like he had indigestion and that he had to burp.  In route to the ED patient burped and actually his pain resolved.  Vision remains chest pain-free at this time and wants to go home.  He denies any associated shortness of breath, nausea, diaphoresis, dizziness.  Additionally, patient denies any recent history of exertional chest pain or shortness of breath.  No history of known CAD.  Past Medical History:  Diagnosis Date  . Gout   . High cholesterol   . Hypertension     Patient Active Problem List   Diagnosis Date Noted  . NSTEMI (non-ST elevated myocardial infarction) ([place]) 07/29/2018    Past Surgical History:  Procedure Laterality Date  . BACK SURGERY          Home Medications    Prior to Admission medications   Medication Sig Start Date End Date Taking? Authorizing Provider  allopurinol (ZYLOPRIM) 100 MG tablet Take 100 mg by mouth daily. 04/28/18  Yes [provider]  CRESTOR 20 MG tablet Take 20 mg by mouth at bedtime.  07/07/14  Yes [provider]  irbesartan (AVAPRO) 150 MG tablet Take 150 mg by mouth daily.   Yes [provider]  predniSONE (DELTASONE) 20 MG tablet 3 tabs po daily x 3 days, then 2 tabs x 3 days, then 1.5 tabs x 3 days, then 1 tab x 3 days, then 0.5 tabs x 3 days Patient taking differently: Take 20 mg by mouth daily as needed (for pain). Take as directed 06/26/18  Yes Jacqlyn Larsen, PA-C  traMADol (ULTRAM) 50 MG tablet Take 1 tablet (50 mg  total) by mouth every 6 (six) hours as needed. 06/26/18  Yes Jacqlyn Larsen, PA-C    Family History History reviewed. No pertinent family history.  Social History Social History   Tobacco Use  . Smoking status: Current Every Day Smoker    Packs/day: 1.00    Types: Cigarettes  . Smokeless tobacco: Never Used  Substance Use Topics  . Alcohol use: No  . Drug use: No     Allergies   Patient has no known allergies.   Review of Systems Review of Systems  Constitutional: Positive for activity change.  Cardiovascular: Positive for chest pain.  Gastrointestinal: Positive for abdominal distention.  Hematological: Does not bruise/bleed easily.  All other systems reviewed and are negative.    Physical Exam Updated Vital Signs BP (!) 156/90   Pulse 97   Temp 97.7 F (36.5 C) (Oral)   Resp 20   Ht 6\' 1"  (1.854 m)   Wt 94.3 kg   SpO2 99%   BMI 27.44 kg/m   Physical Exam  Constitutional: He is oriented to person, place, and time. He appears well-developed.  HENT:  Head: Atraumatic.  Neck: Neck supple.  Cardiovascular: Normal rate, intact distal pulses and normal pulses.  Pulmonary/Chest: Effort normal.  Musculoskeletal:       Right lower leg: He exhibits no edema.  Left lower leg: He exhibits no edema.  Neurological: He is alert and oriented to person, place, and time.  Skin: Skin is warm.  Nursing Simpson and vitals reviewed.    ED Treatments / Results  Labs (all labs ordered are listed, but only abnormal results are displayed) Labs Reviewed  BASIC METABOLIC PANEL - Abnormal; Notable for the following components:      Result Value   Glucose, Bld 124 (*)    All other components within normal limits  TROPONIN I - Abnormal; Notable for the following components:   Troponin I 0.29 (*)    All other components within normal limits  CBC  TROPONIN I  APTT  PROTIME-INR    EKG EKG Interpretation  Date/Time:  Tuesday July 28 2018 22:05:46 EDT Ventricular  Rate:  101 PR Interval:    QRS Duration: 117 QT Interval:  359 QTC Calculation: 466 R Axis:   51 Text Interpretation:  Sinus tachycardia Incomplete right bundle branch block Repol abnrm suggests ischemia, diffuse leads Nonspecific ST abnormality - elevation in avR, v1, avL ST depression in v3-v6 new changes noted Confirmed by Varney Biles [phone]) on 07/28/2018 11:43:05 PM    EKG Interpretation  Date/Time:  Wednesday July 29 2018 01:25:35 EDT Ventricular Rate:  91 PR Interval:    QRS Duration: 117 QT Interval:  364 QTC Calculation: 448 R Axis:   73 Text Interpretation:  Sinus rhythm Incomplete right bundle branch block Probable anteroseptal infarct, recent ST ELEVATION v1-v2 - 2 mm in v2, 1 mm in v1 inferolateral ST depression V2 ST elevation is worse Confirmed by Varney Biles [phone]) on 07/29/2018 1:48:02 AM        Radiology Dg Chest 2 View  Result Date: 07/28/2018 CLINICAL DATA:  Mid chest pain EXAM: CHEST - 2 VIEW COMPARISON:  01/14/2015 FINDINGS: The heart size and mediastinal contours are within normal limits. Both lungs are clear. The visualized skeletal structures are unremarkable. IMPRESSION: No active cardiopulmonary disease. Electronically Signed   By: Inez Catalina M.D.   On: 07/28/2018 21:55    Procedures Procedures (including critical care time)  The patient was counseled on the dangers of tobacco use, and was advised to quit.  Reviewed strategies to maximize success, including removing cigarettes and smoking materials from environment, stress management, substitution of other forms of reinforcement and support of family/friends. Discussion 2-3 min.   CRITICAL CARE Performed by: Jaun Galluzzo   Total critical care time: 52 minutes  Critical care time was exclusive of separately billable procedures and treating other patients.  Critical care was necessary to treat or prevent imminent or life-threatening deterioration.  Critical care was time spent  personally by me on the following activities: development of treatment plan with patient and/or surrogate as well as nursing, discussions with consultants, evaluation of patient's response to treatment, examination of patient, obtaining history from patient or surrogate, ordering and performing treatments and interventions, ordering and review of laboratory studies, ordering and review of radiographic studies, pulse oximetry and re-evaluation of patient's condition.    Medications Ordered in ED Medications  heparin bolus via infusion 4,000 Units (has no administration in time range)    Followed by  heparin ADULT infusion 100 units/mL (25000 units/217mL sodium chloride 0.45%) (has no administration in time range)  aspirin chewable tablet 324 mg (324 mg Oral Given 07/29/18 0126)     Initial Impression / Assessment and Plan / ED Course  I have reviewed the triage vital signs and the nursing notes.  Pertinent labs &  imaging results that were available during my care of the patient were reviewed by me and considered in my medical decision making (see chart for details).  Clinical Course as of Jul 29 149  Wed Jul 29, 2018  0124 Sze control was elevated.  We will consult cardiology. Patient has been made aware of the results. Aspirin and heparin given.  Patient continues to be chest pain-free.  Troponin I(!!): 0.29 [AN]  0148 Spoke with the cardiology fellow. We discussed patient's presentation along with initial EKG that had some concerning findings.  We discussed the current EKG which has persistent elevation in V1 and V2, but patient not having any chest pains.  Cardiology team wants patient to be transferred [facility] to [facility] for ACS treatment - no STEMI activation.  Diagnosis will be NSTEMI.  Heparin to be initiated.   [AN]  0149 Long discussion with the patient about his results and the concerns in the ED. Patient is willing to stay. Blood pressure is improved since he arrived, we will  not give any antihypertensives for now with a blood pressure 150s/90s.   [AN]    Clinical Course User Index [AN] Varney Biles, MD    Differential diagnosis includes: ACS syndrome Aortic dissection Pericarditis PE Pneumothorax Musculoskeletal pain PUD / Gastritis / Esophagitis Esophageal spasm  72 year old male comes in with chief complaint of epigastric chest discomfort.  Patient is weak and describing the pain, however started after he had dinner.  Pain was quite persistent therefore he decided to come to the ED, however in route he started burping and now sits chest pain-free.  Interestingly, EKG does have some subtle ST changes.  aVR in particular has mild ST elevation and there is diffuse ST depression, which does make [place] think of patient having LAD disease.  Patient wants to leave immediately.  I informed him that it would be better for him to wait at least for delta troponin and a repeat EKG prior to discharge.  Final Clinical Impressions(s) / ED Diagnoses   Final diagnoses:  NSTEMI (non-ST elevated myocardial infarction) [facility])    ED Discharge Orders    None       Varney Biles, MD 07/29/18 0086    Varney Biles, MD 07/29/18 0150

## 2018-07-29 NOTE — H&P (View-Only) (Signed)
Progress Note  Patient Name: Timothy Simpson [facility] Date of Encounter: 07/29/2018  Primary Cardiologist: No primary care provider on file.  Subjective   Feeling well, no chest pain.   Inpatient Medications    Scheduled Meds: . allopurinol  100 mg Oral Daily  . [START ON 07/30/2018] aspirin EC  81 mg Oral Daily  . atorvastatin  80 mg Oral q1800  . irbesartan  150 mg Oral Daily  . metoprolol tartrate  12.5 mg Oral Once  . metoprolol tartrate  25 mg Oral BID   Continuous Infusions: . heparin 1,200 Units/hr (07/29/18 0529)   PRN Meds: acetaminophen, nitroGLYCERIN, ondansetron (ZOFRAN) IV   Vital Signs    Vitals:   07/29/18 0300 07/29/18 0330 07/29/18 0505 07/29/18 0511  BP: 120/76 (!) 140/91  (!) 158/99  Pulse: 71 77  75  Resp: 19 (!) 21  18  Temp:    97.9 F (36.6 C)  TempSrc:    Oral  SpO2: 99% 98%  99%  Weight:   88.2 kg   Height:   6\' 1"  (1.854 m)     Intake/Output Summary (Last 24 hours) at 07/29/2018 1036 Last data filed at 07/29/2018 4098 Gross per 24 hour  Intake 0 ml  Output 300 ml  Net -300 ml   Filed Weights   07/28/18 2130 07/29/18 0505  Weight: 94.3 kg 88.2 kg    Telemetry    SR with PVCs - Personally Reviewed  ECG    SR with slight ST elevation in v1, v2 more so than yesterday - Personally Reviewed  Physical Exam   General: Well developed, well nourished, male appearing in no acute distress. Head: Normocephalic, atraumatic.  Neck: Supple, JVD. Lungs:  Resp regular and unlabored, CTA. Heart: RRR, S1, S2, no murmur; no rub. Abdomen: Soft, non-tender, non-distended with normoactive bowel sounds. No hepatomegaly. No rebound/guarding. No obvious abdominal masses. Extremities: No clubbing, cyanosis, edema. Distal pedal pulses are 2+ bilaterally. Neuro: Alert and oriented X 3. Moves all extremities spontaneously. Psych: Normal affect.  Labs    Chemistry Recent Labs  Lab 07/28/18 2210  NA 141  K 3.9  CL 102  CO2 31  GLUCOSE 124*    BUN 8  CREATININE 1.15  CALCIUM 9.4  GFRNONAA >60  GFRAA >60  ANIONGAP 8     Hematology Recent Labs  Lab 07/28/18 2210  WBC 5.5  RBC 4.69  HGB 14.0  HCT 41.9  MCV 89.3  MCH 29.9  MCHC 33.4  RDW 13.3  PLT 206    Cardiac Enzymes Recent Labs  Lab 07/28/18 2210 07/29/18 0034 07/29/18 0511  TROPONINI <0.03 0.29* 1.28*   No results for input(s): TROPIPOC in the last 168 hours.   BNPNo results for input(s): BNP, PROBNP in the last 168 hours.   DDimer No results for input(s): DDIMER in the last 168 hours.    Radiology    Dg Chest 2 View  Result Date: 07/28/2018 CLINICAL DATA:  Mid chest pain EXAM: CHEST - 2 VIEW COMPARISON:  01/14/2015 FINDINGS: The heart size and mediastinal contours are within normal limits. Both lungs are clear. The visualized skeletal structures are unremarkable. IMPRESSION: No active cardiopulmonary disease. Electronically Signed   By: Inez Catalina M.D.   On: 07/28/2018 21:55    Cardiac Studies   N/a   Patient Profile     72 y.o. male with PMH of HTN, gout, HL and tobacco use who presented with chest pressure to [facility]. Found to have elevated troponins  and transferred to [facility] for cardiac cath.   Assessment & Plan    1. NSTEMI: Clinical picture concerning for ACS. Trop up to 1.28. Remains on IV heparin. No chest pain since admission. Originally planned for cath, but schedule is full today. Given he is pain free, will plan for cath tomorrow. If recurrence of pain or further EKG changes, will take sooner.  -- continue ASA, statin, BB -- echo pending  2. HTN: blood pressures are not well controlled. Given additional metoprolol 12.5 this morning. Switch to coreg 3.125mg  BID this evening. Follow up -- continue ARB  3. HL: on high dose statin -- lipids in the am  4. Tobacco use: cessation advised.    Signed, Reino Bellis, NP  07/29/2018, 10:36 AM  Pager # [phone]   I have seen, examined and evaluated the patient this AM along with  Reino Bellis, NP-C.  After reviewing all the available data and chart, we discussed the patients laboratory, study & physical findings as well as symptoms in detail. I agree with her findings, examination as well as impression recommendations as per our discussion.     Very pleasant 72 year old gentleman with hypertension, hyperlipidemia and tobacco abuse who presents with a prolonged episode of substernal chest pressure that was actually relieved with belching, unfortunately he had positive troponins consistent with non-ST elevation MI.  Currently chest pain-free.  Hemodynamically stable.  He does have some subtle ST elevations in anterior leads it is somewhat concerning for possible ischemia, but with no active symptoms, we can hold safely wait until tomorrow for heart catheterization and possible PCI.  This will allow [place] to titrate up blood pressure medications and get him started on statin etc.  Due to full Cath Lab schedule, I do not think we will get to his catheterization until tomorrow unless he has recurrent symptoms.  Performing MD:  [facility]  Procedure: LEFT HEART CATHETERIZATION WITH NATIVE CORONARY ANGIOGRAPHY AND POSSIBLE PERCUTANEOUS CORONARY INTERVENTION  The procedure with Risks/Benefits/Alternatives and Indications was reviewed with the patient as well as his wife and son.  All questions were answered.    Risks / Complications include, but not limited to: Death, MI, CVA/TIA, VF/VT (with defibrillation), Bradycardia (need for temporary pacer placement), contrast induced nephropathy, bleeding / bruising / hematoma / pseudoaneurysm, vascular or coronary injury (with possible emergent CT or Vascular Surgery), adverse medication reactions, infection.  Additional risks involving the use of radiation with the possibility of radiation burns and cancer were explained in detail.  The patient (and family) voice understanding and agree to proceed.       Glenetta Hew, M.D.,  M.S. Interventional Cardiologist   Pager # [phone] Phone # [phone] [address] [place], [place] [address]      For questions or updates, please contact [place] Please consult [email or web address] for contact info under Cardiology/STEMI.

## 2018-07-29 NOTE — Progress Notes (Signed)
CRITICAL VALUE ALERT  Critical Value:  Troponin 1.28  Date & Time Notied:  07/29/18 -  0837  Provider Notified:L Mancel Bale NP Orders Received/Actions taken: awaiting call back

## 2018-07-29 NOTE — Progress Notes (Addendum)
Progress Note  Patient Name: Timothy Simpson [facility] Date of Encounter: 07/29/2018  Primary Cardiologist: No primary care provider on file.  Subjective   Feeling well, no chest pain.   Inpatient Medications    Scheduled Meds: . allopurinol  100 mg Oral Daily  . [START ON 07/30/2018] aspirin EC  81 mg Oral Daily  . atorvastatin  80 mg Oral q1800  . irbesartan  150 mg Oral Daily  . metoprolol tartrate  12.5 mg Oral Once  . metoprolol tartrate  25 mg Oral BID   Continuous Infusions: . heparin 1,200 Units/hr (07/29/18 0529)   PRN Meds: acetaminophen, nitroGLYCERIN, ondansetron (ZOFRAN) IV   Vital Signs    Vitals:   07/29/18 0300 07/29/18 0330 07/29/18 0505 07/29/18 0511  BP: 120/76 (!) 140/91  (!) 158/99  Pulse: 71 77  75  Resp: 19 (!) 21  18  Temp:    97.9 F (36.6 C)  TempSrc:    Oral  SpO2: 99% 98%  99%  Weight:   88.2 kg   Height:   6\' 1"  (1.854 m)     Intake/Output Summary (Last 24 hours) at 07/29/2018 1036 Last data filed at 07/29/2018 7412 Gross per 24 hour  Intake 0 ml  Output 300 ml  Net -300 ml   Filed Weights   07/28/18 2130 07/29/18 0505  Weight: 94.3 kg 88.2 kg    Telemetry    SR with PVCs - Personally Reviewed  ECG    SR with slight ST elevation in v1, v2 more so than yesterday - Personally Reviewed  Physical Exam   General: Well developed, well nourished, male appearing in no acute distress. Head: Normocephalic, atraumatic.  Neck: Supple, JVD. Lungs:  Resp regular and unlabored, CTA. Heart: RRR, S1, S2, no murmur; no rub. Abdomen: Soft, non-tender, non-distended with normoactive bowel sounds. No hepatomegaly. No rebound/guarding. No obvious abdominal masses. Extremities: No clubbing, cyanosis, edema. Distal pedal pulses are 2+ bilaterally. Neuro: Alert and oriented X 3. Moves all extremities spontaneously. Psych: Normal affect.  Labs    Chemistry Recent Labs  Lab 07/28/18 2210  NA 141  K 3.9  CL 102  CO2 31  GLUCOSE 124*    BUN 8  CREATININE 1.15  CALCIUM 9.4  GFRNONAA >60  GFRAA >60  ANIONGAP 8     Hematology Recent Labs  Lab 07/28/18 2210  WBC 5.5  RBC 4.69  HGB 14.0  HCT 41.9  MCV 89.3  MCH 29.9  MCHC 33.4  RDW 13.3  PLT 206    Cardiac Enzymes Recent Labs  Lab 07/28/18 2210 07/29/18 0034 07/29/18 0511  TROPONINI <0.03 0.29* 1.28*   No results for input(s): TROPIPOC in the last 168 hours.   BNPNo results for input(s): BNP, PROBNP in the last 168 hours.   DDimer No results for input(s): DDIMER in the last 168 hours.    Radiology    Dg Chest 2 View  Result Date: 07/28/2018 CLINICAL DATA:  Mid chest pain EXAM: CHEST - 2 VIEW COMPARISON:  01/14/2015 FINDINGS: The heart size and mediastinal contours are within normal limits. Both lungs are clear. The visualized skeletal structures are unremarkable. IMPRESSION: No active cardiopulmonary disease. Electronically Signed   By: Inez Catalina M.D.   On: 07/28/2018 21:55    Cardiac Studies   N/a   Patient Profile     72 y.o. male with PMH of HTN, gout, HL and tobacco use who presented with chest pressure to [facility]. Found to have elevated troponins  and transferred to [facility] for cardiac cath.   Assessment & Plan    1. NSTEMI: Clinical picture concerning for ACS. Trop up to 1.28. Remains on IV heparin. No chest pain since admission. Originally planned for cath, but schedule is full today. Given he is pain free, will plan for cath tomorrow. If recurrence of pain or further EKG changes, will take sooner.  -- continue ASA, statin, BB -- echo pending  2. HTN: blood pressures are not well controlled. Given additional metoprolol 12.5 this morning. Switch to coreg 3.125mg  BID this evening. Follow up -- continue ARB  3. HL: on high dose statin -- lipids in the am  4. Tobacco use: cessation advised.    Signed, Reino Bellis, NP  07/29/2018, 10:36 AM  Pager # [phone]   I have seen, examined and evaluated the patient this AM along with  Reino Bellis, NP-C.  After reviewing all the available data and chart, we discussed the patients laboratory, study & physical findings as well as symptoms in detail. I agree with her findings, examination as well as impression recommendations as per our discussion.     Very pleasant 72 year old gentleman with hypertension, hyperlipidemia and tobacco abuse who presents with a prolonged episode of substernal chest pressure that was actually relieved with belching, unfortunately he had positive troponins consistent with non-ST elevation MI.  Currently chest pain-free.  Hemodynamically stable.  He does have some subtle ST elevations in anterior leads it is somewhat concerning for possible ischemia, but with no active symptoms, we can hold safely wait until tomorrow for heart catheterization and possible PCI.  This will allow [place] to titrate up blood pressure medications and get him started on statin etc.  Due to full Cath Lab schedule, I do not think we will get to his catheterization until tomorrow unless he has recurrent symptoms.  Performing MD:  [facility]  Procedure: LEFT HEART CATHETERIZATION WITH NATIVE CORONARY ANGIOGRAPHY AND POSSIBLE PERCUTANEOUS CORONARY INTERVENTION  The procedure with Risks/Benefits/Alternatives and Indications was reviewed with the patient as well as his wife and son.  All questions were answered.    Risks / Complications include, but not limited to: Death, MI, CVA/TIA, VF/VT (with defibrillation), Bradycardia (need for temporary pacer placement), contrast induced nephropathy, bleeding / bruising / hematoma / pseudoaneurysm, vascular or coronary injury (with possible emergent CT or Vascular Surgery), adverse medication reactions, infection.  Additional risks involving the use of radiation with the possibility of radiation burns and cancer were explained in detail.  The patient (and family) voice understanding and agree to proceed.       Glenetta Hew, M.D.,  M.S. Interventional Cardiologist   Pager # [phone] Phone # [phone] [address] [place], [place] [address]      For questions or updates, please contact [place] Please consult [email or web address] for contact info under Cardiology/STEMI.

## 2018-07-29 NOTE — H&P (Signed)
Patient ID: Timothy Simpson MRN: [id number], DOB/AGE: 05-24-1946   Admit date: 07/28/2018   Primary Physician: Jani Gravel, MD  Pt. Profile: 76 gentleman w/ HTN, gout, tobacco use HLD who presents with chest pressure, found to have NSTEMI  Problem List  Past Medical History:  Diagnosis Date  . Gout   . High cholesterol   . Hypertension     Past Surgical History:  Procedure Laterality Date  . BACK SURGERY       Allergies  No Known Allergies  HPI Very active gentleman. 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 Able to walk up 2-3 flights of stairs w/o CP or SOB.  However he has had episode of indigestion over the last several weeks. Last night, 8/27 after dinner he described chest discomfort and tightness. This was associated with abdominal discomfort and need to belch. Denies nausea/vomiting.   Smokes 1ppd since grade school. Takes his medications as directed.   Home Medications  Prior to Admission medications   Medication Sig Start Date End Date Taking? Authorizing Provider  allopurinol (ZYLOPRIM) 100 MG tablet Take 100 mg by mouth daily. 04/28/18  Yes [provider]  CRESTOR 20 MG tablet Take 20 mg by mouth at bedtime.  07/07/14  Yes [provider]  irbesartan (AVAPRO) 150 MG tablet Take 150 mg by mouth daily.   Yes [provider]  predniSONE (DELTASONE) 20 MG tablet 3 tabs po daily x 3 days, then 2 tabs x 3 days, then 1.5 tabs x 3 days, then 1 tab x 3 days, then 0.5 tabs x 3 days Patient taking differently: Take 20 mg by mouth daily as needed (for pain). Take as directed 06/26/18  Yes Jacqlyn Larsen, PA-C  traMADol (ULTRAM) 50 MG tablet Take 1 tablet (50 mg total) by mouth every 6 (six) hours as needed. 06/26/18  Yes Jacqlyn Larsen, PA-C    Family History  History reviewed. No pertinent family history.  Social History  Social History   Socioeconomic History  . Marital status: Married    Spouse name: Not on file    . Number of children: Not on file  . Years of education: Not on file  . Highest education level: Not on file  Occupational History  . Not on file  Social Needs  . Financial resource strain: Not on file  . Food insecurity:    Worry: Not on file    Inability: Not on file  . Transportation needs:    Medical: Not on file    Non-medical: Not on file  Tobacco Use  . Smoking status: Current Every Day Smoker    Packs/day: 1.00    Types: Cigarettes  . Smokeless tobacco: Never Used  Substance and Sexual Activity  . Alcohol use: No  . Drug use: No  . Sexual activity: Not on file  Lifestyle  . Physical activity:    Days per week: Not on file    Minutes per session: Not on file  . Stress: Not on file  Relationships  . Social connections:    Talks on phone: Not on file    Gets together: Not on file    Attends religious service: Not on file    Active member of club or organization: Not on file    Attends meetings of clubs or organizations: Not on file    Relationship status: Not on file  . Intimate partner violence:    Fear of current or ex partner:  Not on file    Emotionally abused: Not on file    Physically abused: Not on file    Forced sexual activity: Not on file  Other Topics Concern  . Not on file  Social History Narrative  . Not on file     Review of Systems General:  No chills, fever, night sweats or weight changes.  Cardiovascular:  +chest pain, no dyspnea on exertion, edema, orthopnea, palpitations, paroxysmal nocturnal dyspnea. Dermatological: No rash, lesions/masses Respiratory: No cough, dyspnea Urologic: No hematuria, dysuria Abdominal:   No nausea, vomiting, diarrhea, bright red blood per rectum, melena, or hematemesis Neurologic:  No visual changes, wkns, changes in mental status. All other systems reviewed and are otherwise negative except as noted above.  Physical Exam  Blood pressure (!) 158/99, pulse 75, temperature 97.9 F (36.6 C), temperature  source Oral, resp. rate 18, height 6\' 1"  (1.854 m), weight 88.2 kg, SpO2 99 %.  General: Pleasant, NAD Psych: Normal affect. Neuro: Alert and oriented X 3. Moves all extremities spontaneously. HEENT: Normal  Neck: Supple without bruits or JVD. Lungs:  Resp regular and unlabored, CTA. Heart: RRR no s3, s4, or murmurs. Abdomen: Soft, non-tender, non-distended, BS + x 4.  Extremities: No clubbing, cyanosis or edema. DP/PT/Radials 2+ and equal bilaterally.  Labs  Troponin (Point of Care Test) No results for input(s): TROPIPOC in the last 72 hours. Recent Labs    07/28/18 2210 07/29/18 0034  TROPONINI <0.03 0.29*   Lab Results  Component Value Date   WBC 5.5 07/28/2018   HGB 14.0 07/28/2018   HCT 41.9 07/28/2018   MCV 89.3 07/28/2018   PLT 206 07/28/2018    Recent Labs  Lab 07/28/18 2210  NA 141  K 3.9  CL 102  CO2 31  BUN 8  CREATININE 1.15  CALCIUM 9.4  GLUCOSE 124*   No results found for: CHOL, HDL, LDLCALC, TRIG No results found for: DDIMER   Radiology/Studies  Dg Chest 2 View  Result Date: 07/28/2018 CLINICAL DATA:  Mid chest pain EXAM: CHEST - 2 VIEW COMPARISON:  01/14/2015 FINDINGS: The heart size and mediastinal contours are within normal limits. Both lungs are clear. The visualized skeletal structures are unremarkable. IMPRESSION: No active cardiopulmonary disease. Electronically Signed   By: Inez Catalina M.D.   On: 07/28/2018 21:55    ECG Sinus.Incomplete RBBB, non-specific ST changes.   Echocardiogram Pending  ====================== ASSESSMENT AND PLAN 16 gentleman w/ HTN, gout, tobacco use HLD who presents with chest pressure, found to have NSTEMI  # NSTEMI: Grace 98, TIMI. Risk factors smoking, HTN and HLD. Very physically active gentleman.  - heparin gtt - asa/statin and bb - NPO for LHC   # HTN - continue ARB  # Gout - continue allopurinol  FULL CODE  Signed, Charlies Silvers, MD Overnight Cardiology Fellow

## 2018-07-29 NOTE — Progress Notes (Addendum)
ANTICOAGULATION CONSULT NOTE - Follow Up Consult  Pharmacy Consult for Heparin Indication: chest pain/ACS  No Known Allergies  Patient Measurements: Height: 6\' 1"  (185.4 cm) Weight: 194 lb 6.4 oz (88.2 kg)(scale a) IBW/kg (Calculated) : 79.9  Vital Signs: Temp: 97.9 F (36.6 C) (08/28 0511) Temp Source: Oral (08/28 0511) BP: 158/99 (08/28 0511) Pulse Rate: 75 (08/28 0511)  Labs: Recent Labs    07/28/18 2210 07/29/18 0034 07/29/18 0139 07/29/18 0511 07/29/18 0822  HGB 14.0  --   --   --   --   HCT 41.9  --   --   --   --   PLT 206  --   --   --   --   APTT  --   --  30  --   --   LABPROT  --   --  12.8  --   --   INR  --   --  0.97  --   --   HEPARINUNFRC  --   --   --   --  0.42  CREATININE 1.15  --   --   --   --   TROPONINI <0.03 0.29*  --  1.28*  --     Estimated Creatinine Clearance: 68.5 mL/min (by C-G formula based on SCr of 1.15 mg/dL).   Assessment: 72 year old male continues on heparin for chest pain Heparin level therapeutic this AM and PM No bleeding noted  Goal of Therapy:  Heparin level 0.3-0.7 units/ml Monitor platelets by anticoagulation protocol: Yes   Plan:  Continue heparin at 1200 units / hr Follow up plans for cath 8/29  Thank you Anette Guarneri, PharmD [phone] 07/29/2018,9:06 AM

## 2018-07-29 NOTE — Progress Notes (Signed)
ANTICOAGULATION CONSULT NOTE - Preliminary  Pharmacy Consult for heparin Indication: chest pain/ACS  No Known Allergies  Patient Measurements: Height: 6\' 1"  (185.4 cm) Weight: 208 lb (94.3 kg) IBW/kg (Calculated) : 79.9 HEPARIN DW (KG): 94.3   Vital Signs: Temp: 97.7 F (36.5 C) (08/27 2130) Temp Source: Oral (08/27 2130) BP: 144/92 (08/27 2230) Pulse Rate: 94 (08/27 2230)  Labs: Recent Labs    07/28/18 2210 07/29/18 0034  HGB 14.0  --   HCT 41.9  --   PLT 206  --   CREATININE 1.15  --   TROPONINI <0.03 0.29*   Estimated Creatinine Clearance: 68.5 mL/min (by C-G formula based on SCr of 1.15 mg/dL).  Medical History: Past Medical History:  Diagnosis Date  . Gout   . High cholesterol   . Hypertension     Medications:  Scheduled:  . heparin  4,000 Units Intravenous Once   Infusions:  . heparin     PRN:   Assessment: 72 yo male came to ED with chest pain.  PMH significant for HTN, HLD, smokes 1 PPD.  Pt is now pain free, tropionin is elevated. Starting heparin.   Goal of Therapy:  Heparin level 0.3-0.7 units/ml   Plan:  Give 4000 units bolus x 1 Start heparin infusion at 1200 units/hr Check anti-Xa level in 6 hours and daily while on heparin Continue to monitor H&H and platelets Preliminary review of pertinent patient information completed.  Forestine Na clinical pharmacist will complete review during morning rounds to assess the patient and finalize treatment regimen.  Kei Mcelhiney Scarlett, RPH 07/29/2018,1:37 AM

## 2018-07-30 ENCOUNTER — Other Ambulatory Visit: Payer: Self-pay | Admitting: *Deleted

## 2018-07-30 ENCOUNTER — Inpatient Hospital Stay (HOSPITAL_COMMUNITY): Payer: Medicare HMO

## 2018-07-30 ENCOUNTER — Encounter (HOSPITAL_COMMUNITY)
Admission: EM | Disposition: A | Discharge status: Home / Self Care | Payer: Self-pay | Source: Home / Self Care | Attending: Internal Medicine

## 2018-07-30 ENCOUNTER — Encounter (HOSPITAL_COMMUNITY): Payer: Self-pay | Admitting: Interventional Cardiology

## 2018-07-30 ENCOUNTER — Other Ambulatory Visit (HOSPITAL_COMMUNITY): Payer: Medicare HMO

## 2018-07-30 DIAGNOSIS — I214 Non-ST elevation (NSTEMI) myocardial infarction: Secondary | ICD-10-CM

## 2018-07-30 DIAGNOSIS — I2510 Atherosclerotic heart disease of native coronary artery without angina pectoris: Secondary | ICD-10-CM

## 2018-07-30 DIAGNOSIS — F17210 Nicotine dependence, cigarettes, uncomplicated: Secondary | ICD-10-CM

## 2018-07-30 DIAGNOSIS — I10 Essential (primary) hypertension: Secondary | ICD-10-CM

## 2018-07-30 DIAGNOSIS — I25110 Atherosclerotic heart disease of native coronary artery with unstable angina pectoris: Secondary | ICD-10-CM

## 2018-07-30 HISTORY — PX: LEFT HEART CATH AND CORONARY ANGIOGRAPHY: CATH118249

## 2018-07-30 LAB — PULMONARY FUNCTION TEST
FEF 25-75 Post: 1.63 L/sec
FEF 25-75 Pre: 0.91 L/sec
FEF2575-%CHANGE-POST: 79 %
FEF2575-%PRED-POST: 58 %
FEF2575-%PRED-PRE: 32 %
FEV1-%Change-Post: 22 %
FEV1-%Pred-Post: 43 %
FEV1-%Pred-Pre: 35 %
FEV1-Post: 1.43 L
FEV1-Pre: 1.17 L
FEV1FVC-%Change-Post: 4 %
FEV1FVC-%PRED-PRE: 97 %
FEV6-%CHANGE-POST: 19 %
FEV6-%Pred-Post: 44 %
FEV6-%Pred-Pre: 37 %
FEV6-Post: 1.84 L
FEV6-Pre: 1.53 L
FEV6FVC-%Pred-Post: 104 %
FEV6FVC-%Pred-Pre: 104 %
FVC-%Change-Post: 17 %
FVC-%Pred-Post: 43 %
FVC-%Pred-Pre: 36 %
FVC-Post: 1.85 L
FVC-Pre: 1.58 L
POST FEV1/FVC RATIO: 77 %
Post FEV6/FVC ratio: 100 %
Pre FEV1/FVC ratio: 74 %
Pre FEV6/FVC Ratio: 100 %

## 2018-07-30 LAB — HEMOGLOBIN A1C
HEMOGLOBIN A1C: 5.8 % — AB (ref 4.8–5.6)
Mean Plasma Glucose: 119.76 mg/dL

## 2018-07-30 LAB — LIPID PANEL
CHOL/HDL RATIO: 4.5 ratio
Cholesterol: 166 mg/dL (ref 0–200)
HDL: 37 mg/dL — AB (ref >40)
LDL CALC: 109 mg/dL — AB (ref 0–99)
Triglycerides: 100 mg/dL (ref <150)
VLDL: 20 mg/dL (ref 0–40)

## 2018-07-30 LAB — HEPARIN LEVEL (UNFRACTIONATED)
HEPARIN UNFRACTIONATED: 0.18 [IU]/mL — AB (ref 0.30–0.70)
Heparin Unfractionated: 0.14 IU/mL — ABNORMAL LOW (ref 0.30–0.70)

## 2018-07-30 SURGERY — LEFT HEART CATH AND CORONARY ANGIOGRAPHY
Anesthesia: LOCAL

## 2018-07-30 MED ORDER — HEPARIN (PORCINE) IN NACL 1000-0.9 UT/500ML-% IV SOLN
INTRAVENOUS | Status: DC | PRN
  Administered 2018-07-30 (×2): 500 mL

## 2018-07-30 MED ORDER — SODIUM CHLORIDE 0.9 % IV SOLN: INTRAVENOUS | Status: AC

## 2018-07-30 MED ORDER — ONDANSETRON HCL 4 MG/2ML IJ SOLN: 4.0000 mg | Freq: Four times a day (QID) | INTRAMUSCULAR | Status: DC | PRN

## 2018-07-30 MED ORDER — CARVEDILOL 6.25 MG PO TABS
6.2500 mg | ORAL_TABLET | Freq: Two times a day (BID) | ORAL | Status: DC
  Administered 2018-07-30 – 2018-08-04 (×12): 6.25 mg via ORAL
  Filled 2018-07-30 (×12): qty 1

## 2018-07-30 MED ORDER — VERAPAMIL HCL 2.5 MG/ML IV SOLN
INTRAVENOUS | Status: DC | PRN
  Administered 2018-07-30: 10 mL via INTRA_ARTERIAL

## 2018-07-30 MED ORDER — IOHEXOL 350 MG/ML SOLN
INTRAVENOUS | Status: DC | PRN
  Administered 2018-07-30: 90 mL via INTRA_ARTERIAL

## 2018-07-30 MED ORDER — LIDOCAINE HCL (PF) 1 % IJ SOLN
INTRAMUSCULAR | Status: DC | PRN
  Administered 2018-07-30: 2 mL

## 2018-07-30 MED ORDER — HEPARIN (PORCINE) IN NACL 1000-0.9 UT/500ML-% IV SOLN
INTRAVENOUS | Status: AC
  Filled 2018-07-30: qty 1000

## 2018-07-30 MED ORDER — ASPIRIN 81 MG PO CHEW
81.0000 mg | CHEWABLE_TABLET | ORAL | Status: AC
  Administered 2018-07-30: 81 mg via ORAL
  Filled 2018-07-30: qty 1

## 2018-07-30 MED ORDER — SODIUM CHLORIDE 0.9% FLUSH: 3.0000 mL | INTRAVENOUS | Status: DC | PRN

## 2018-07-30 MED ORDER — SODIUM CHLORIDE 0.9 % IV SOLN: 250.0000 mL | INTRAVENOUS | Status: DC | PRN

## 2018-07-30 MED ORDER — SODIUM CHLORIDE 0.9% FLUSH
3.0000 mL | Freq: Two times a day (BID) | INTRAVENOUS | Status: DC
  Administered 2018-07-31 – 2018-08-04 (×3): 3 mL via INTRAVENOUS

## 2018-07-30 MED ORDER — HEPARIN (PORCINE) IN NACL 100-0.45 UNIT/ML-% IJ SOLN
1300.0000 [IU]/h | INTRAMUSCULAR | Status: DC
  Administered 2018-07-30: 1300 [IU]/h via INTRAVENOUS
  Administered 2018-07-31: 1450 [IU]/h via INTRAVENOUS
  Administered 2018-08-01: 1300 [IU]/h via INTRAVENOUS
  Administered 2018-08-01: 1450 [IU]/h via INTRAVENOUS
  Administered 2018-08-03 – 2018-08-04 (×2): 1300 [IU]/h via INTRAVENOUS
  Filled 2018-07-30 (×5): qty 250

## 2018-07-30 MED ORDER — ALBUTEROL SULFATE (2.5 MG/3ML) 0.083% IN NEBU
2.5000 mg | INHALATION_SOLUTION | Freq: Once | RESPIRATORY_TRACT | Status: AC
  Administered 2018-07-30: 2.5 mg via RESPIRATORY_TRACT

## 2018-07-30 MED ORDER — VERAPAMIL HCL 2.5 MG/ML IV SOLN
INTRAVENOUS | Status: AC
  Filled 2018-07-30: qty 2

## 2018-07-30 MED ORDER — FENTANYL CITRATE (PF) 100 MCG/2ML IJ SOLN
INTRAMUSCULAR | Status: DC | PRN
  Administered 2018-07-30: 25 ug via INTRAVENOUS

## 2018-07-30 MED ORDER — LIDOCAINE HCL (PF) 1 % IJ SOLN
INTRAMUSCULAR | Status: AC
  Filled 2018-07-30: qty 30

## 2018-07-30 MED ORDER — MIDAZOLAM HCL 2 MG/2ML IJ SOLN
INTRAMUSCULAR | Status: AC
  Filled 2018-07-30: qty 2

## 2018-07-30 MED ORDER — HEPARIN SODIUM (PORCINE) 1000 UNIT/ML IJ SOLN
INTRAMUSCULAR | Status: AC
  Filled 2018-07-30: qty 1

## 2018-07-30 MED ORDER — HEPARIN SODIUM (PORCINE) 1000 UNIT/ML IJ SOLN
INTRAMUSCULAR | Status: DC | PRN
  Administered 2018-07-30: 4500 [IU] via INTRAVENOUS

## 2018-07-30 MED ORDER — ACETAMINOPHEN 325 MG PO TABS
650.0000 mg | ORAL_TABLET | ORAL | Status: DC | PRN
  Administered 2018-07-30: 650 mg via ORAL
  Filled 2018-07-30: qty 2

## 2018-07-30 MED ORDER — MIDAZOLAM HCL 2 MG/2ML IJ SOLN
INTRAMUSCULAR | Status: DC | PRN
  Administered 2018-07-30: 2 mg via INTRAVENOUS

## 2018-07-30 MED ORDER — FENTANYL CITRATE (PF) 100 MCG/2ML IJ SOLN
INTRAMUSCULAR | Status: AC
  Filled 2018-07-30: qty 2

## 2018-07-30 SURGICAL SUPPLY — 11 items
CATH IMPULSE 5F ANG/FL3.5 (CATHETERS) ×1 IMPLANT
CATH INFINITI 5 FR AR1 MOD (CATHETERS) ×1 IMPLANT
DEVICE RAD COMP TR BAND LRG (VASCULAR PRODUCTS) ×1 IMPLANT
GLIDESHEATH SLEND SS 6F .021 (SHEATH) ×1 IMPLANT
GUIDEWIRE INQWIRE 1.5J.035X260 (WIRE) IMPLANT
INQWIRE 1.5J .035X260CM (WIRE) ×2
KIT HEART LEFT (KITS) ×2 IMPLANT
PACK CARDIAC CATHETERIZATION (CUSTOM PROCEDURE TRAY) ×2 IMPLANT
SHEATH PROBE COVER 6X72 (BAG) ×1 IMPLANT
TRANSDUCER W/STOPCOCK (MISCELLANEOUS) ×2 IMPLANT
TUBING CIL FLEX 10 FLL-RA (TUBING) ×2 IMPLANT

## 2018-07-30 NOTE — Progress Notes (Signed)
Pt with episodes of memory impairment. Pt pulled IV out which had Heparin drip infusing. Room cleaned, IV restarted/wrapped, and Heparin drip resumed. 2nd IV attempt for cardiac cath unsuccessful. Will page IV team in AM. Will continue to monitor.

## 2018-07-30 NOTE — Progress Notes (Signed)
Pt's wife informed that cardiac cath has been rescheduled to early morning. Pt's wife to come to hospital immediately.

## 2018-07-30 NOTE — Consult Note (Addendum)
[address]       [place] [address]             [phone]        Orvis W Louvier Orrick Medical Record #[id number] Date of Birth: 1945-12-28  Referring: No ref. provider found Primary Care: Jani Gravel, MD Primary Cardiologist:No primary care provider on file.  Chief Complaint:    Chief Complaint  Patient presents with  . Chest Pain    History of Present Illness:      Mr. Timothy [place] is a 72 year old male patient with a past medical history of hypertension, gout, hyperlipidemia, and tobacco abuse (he smokes a pack per day) who presents with a prolonged episode of substernal chest pressure.  He reports that after dinner around 7 PM he started to have generalized epigastric discomfort.  En route to the ED he belched and felt better. He felt his pain could be due to indigestion.  He did have a positive troponin which peaked at 3.65 consistent with NSTEMI.  An echocardiogram was performed yesterday and showed an estimated left ventricular ejection fraction of 60 to 65%, and no significant valvular disease.  Cardiac catheterization was performed today which showed complex anatomy at the distal left main with disease up to 50% followed by aneurysmal section.  95% ostial ramus stenosis, distal RCA stenosis is 100%, mid circumflex stenosis is 70%, and proximal LAD stenosis 60%.  Estimated left ventricular ejection fraction on cath is 55 to 65%.  Given the patient's multivessel disease  coronary bypass grafting surgery was recommended.    Current Activity/ Functional Status: Patient was independent with mobility/ambulation, transfers, ADL's, IADL's.   Zubrod Score: At the time of surgery this patient's most appropriate activity status/level should be described as: []     0    Normal activity, no symptoms [x]     1    Restricted in physical strenuous activity but ambulatory, able to do out light work []     2    Ambulatory and capable of self care, unable  to do work activities, up and about                 more than 50%  Of the time                            []     3    Only limited self care, in bed greater than 50% of waking hours []     4    Completely disabled, no self care, confined to bed or chair []     5    Moribund  Past Medical History:  Diagnosis Date  . Gout   . High cholesterol   . Hypertension     Past Surgical History:  Procedure Laterality Date  . BACK SURGERY    . LEFT HEART CATH AND CORONARY ANGIOGRAPHY N/A 07/30/2018   Procedure: LEFT HEART CATH AND CORONARY ANGIOGRAPHY;  Surgeon: Jettie Booze, MD;  Location: [facility];  Service: Cardiovascular;  Laterality: N/A;    Social History   Tobacco Use  Smoking Status Current Every Day Smoker  . Packs/day: 1.00  . Types: Cigarettes  Smokeless Tobacco Never Used    Social History   Substance and Sexual Activity  Alcohol Use No     No Known Allergies  Current Facility-Administered Medications  Medication Dose Route Frequency Provider Last Rate Last Dose  .  0.9 %  sodium chloride infusion  250 mL Intravenous PRN Jettie Booze, MD      . acetaminophen (TYLENOL) tablet 650 mg  650 mg Oral Q4H PRN Jettie Booze, MD      . allopurinol (ZYLOPRIM) tablet 100 mg  100 mg Oral Daily Jettie Booze, MD   100 mg at 07/30/18 0831  . aspirin EC tablet 81 mg  81 mg Oral Daily Jettie Booze, MD      . atorvastatin (LIPITOR) tablet 80 mg  80 mg Oral q1800 Jettie Booze, MD   80 mg at 07/29/18 1740  . carvedilol (COREG) tablet 6.25 mg  6.25 mg Oral BID WC Jettie Booze, MD   6.25 mg at 07/30/18 3244  . heparin ADULT infusion 100 units/mL (25000 units/246mL sodium chloride 0.45%)  1,300 Units/hr Intravenous Continuous Charlies Silvers E, MD      . irbesartan (AVAPRO) tablet 150 mg  150 mg Oral Daily Jettie Booze, MD   150 mg at 07/30/18 0831  . nitroGLYCERIN (NITROSTAT) SL tablet 0.4 mg  0.4 mg Sublingual Q5 Min x 3 PRN  Jettie Booze, MD      . ondansetron [facility]) injection 4 mg  4 mg Intravenous Q6H PRN Jettie Booze, MD      . sodium chloride flush (NS) 0.9 % injection 3 mL  3 mL Intravenous Q12H Larae Grooms S, MD      . sodium chloride flush (NS) 0.9 % injection 3 mL  3 mL Intravenous PRN Jettie Booze, MD        Medications Prior to Admission  Medication Sig Dispense Refill Last Dose  . allopurinol (ZYLOPRIM) 100 MG tablet Take 100 mg by mouth daily.   07/28/2018 at Unknown time  . CRESTOR 20 MG tablet Take 20 mg by mouth at bedtime.    07/28/2018 at Unknown time  . irbesartan (AVAPRO) 150 MG tablet Take 150 mg by mouth daily.   07/28/2018 at Unknown time  . predniSONE (DELTASONE) 20 MG tablet 3 tabs po daily x 3 days, then 2 tabs x 3 days, then 1.5 tabs x 3 days, then 1 tab x 3 days, then 0.5 tabs x 3 days (Patient taking differently: Take 20 mg by mouth daily as needed (for pain). Take as directed) 27 tablet 0 unknown  . traMADol (ULTRAM) 50 MG tablet Take 1 tablet (50 mg total) by mouth every 6 (six) hours as needed. 15 tablet 0 07/28/2018 at Unknown time    History reviewed. No pertinent family history.   Review of Systems:   Review of Systems  Constitutional: Negative for diaphoresis, fever and malaise/fatigue.  HENT: Negative.   Respiratory: Negative for cough and sputum production.   Cardiovascular: Positive for chest pain. Negative for palpitations and leg swelling.  Gastrointestinal: Positive for heartburn. Negative for nausea and vomiting.  Neurological: Negative.    Pertinent items are noted in HPI.      Physical Exam: BP 125/79   Pulse 70   Temp 98.6 F (37 C) (Oral)   Resp 20   Ht 6\' 1"  (1.854 m)   Wt 87 kg   SpO2 96%   BMI 25.29 kg/m    General appearance: alert, cooperative and no distress Resp: clear to auscultation bilaterally Cardio: regular rate and rhythm, S1, S2 normal, no murmur, click, rub or gallop GI: soft, non-tender; bowel sounds  normal; no masses,  no organomegaly Extremities: extremities normal, atraumatic, no cyanosis or edema  Neurologic: Grossly normal  Diagnostic Studies & Laboratory data:   Cardiac Cath 07/30/2018  Mid LM to Dist LM lesion is 50% stenosed. 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  Ost Ramus lesion is 95% stenosed.  Lat Ramus lesion is 99% stenosed.  Dist RCA lesion is 100% stenosed.  Mid Cx lesion is 70% stenosed.  Prox LAD lesion is 60% stenosed.  The left ventricular systolic function is normal.  LV end diastolic pressure is normal. LVEDP 6 mm Hg.  The left ventricular ejection fraction is 55-65% by visual estimate.  There is no aortic valve stenosis.   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       Result status: Final result                              *[facility]*                         [address], [place] [address]                            [phone]  ------------------------------------------------------------------- Transthoracic Echocardiography  Patient:    Harvin, Kon[place]k MR #:       [id number] Study Date: 07/29/2018 Gender:     M Age:        89 Height:     185.4 cm Weight:     88.2 kg BSA:        2.14 m^2 Pt. Status: Room:       3E04C   ADMITTING    Rozann Lesches, M.D.  [place], [place] [id number]  PERFORMING   [facility], Inpatient  ORDERING     Elson Areas, [place]  REFERRING    Charlies Silvers E  SONOGRAPHER  Dance, Tiffany  cc:  ------------------------------------------------------------------- LV EF: 60% -   65%  ------------------------------------------------------------------- Indications:      MI - acute  410.91.  ------------------------------------------------------------------- History:   Risk factors:  Hypertension. Dyslipidemia.  ------------------------------------------------------------------- Study Conclusions  - Left ventricle: The cavity size was normal. There was mild   concentric hypertrophy. Systolic function was normal. The   estimated ejection fraction was in the range of 60% to 65%. Wall   motion was normal; there were no regional wall motion   abnormalities. There was an increased relative contribution of   atrial contraction to ventricular filling. Doppler parameters are   consistent with abnormal left ventricular relaxation (grade 1   diastolic dysfunction). - Mitral valve: There was mild regurgitation. - Atrial septum: There was increased thickness of the septum,   consistent with lipomatous hypertrophy. - Tricuspid valve: There was mild regurgitation.         Recent Radiology Findings:   Dg Chest 2 View  Result Date: 07/28/2018 CLINICAL DATA:  Mid chest pain EXAM: CHEST - 2 VIEW COMPARISON:  01/14/2015 FINDINGS: The heart  size and mediastinal contours are within normal limits. Both lungs are clear. The visualized skeletal structures are unremarkable. IMPRESSION: No active cardiopulmonary disease. Electronically Signed   By: Inez Catalina M.D.   On: 07/28/2018 21:55     I have independently reviewed the above radiologic studies and discussed with the patient   Recent Lab Findings: Lab Results  Component Value Date   WBC 5.5 07/28/2018   HGB 14.0 07/28/2018   HCT 41.9 07/28/2018   PLT 206 07/28/2018   GLUCOSE 124 (H) 07/28/2018   CHOL 166 07/30/2018   TRIG 100 07/30/2018   HDL 37 (L) 07/30/2018   LDLCALC 109 (H) 07/30/2018   NA 141 07/28/2018   K 3.9 07/28/2018   CL 102 07/28/2018   CREATININE 1.15 07/28/2018   BUN 8 07/28/2018   CO2 31 07/28/2018   INR 0.97 07/29/2018   HGBA1C 5.8 (H) 07/30/2018      Assessment / Plan:     1. Unstable  angina/NSTEMI-peak troponin 3.65. Cardiac cath showed multivessel disease. Continue ASA, BB, and statin. He is currently on heparin gtt. Nitro SL when needed. He is currently chest pain free. Tentative plan for CABG with Dr. Roxy Manns next week.  2. Hypertension-currently well controlled on Coreg and Irbesartan. Continue medical management 3. Hyperlipidemia- continue statin therapy 4. Gout-On Allopurinol. It is most painful in his right big toe.  5. Tobacco use- cessation discussed. He has not smoked since being in the hospital   Plan: Coronary artery bypass grafting discussed with the patient and wife at the bedside in detail. All questions were answered to the patient and wife's satisfaction. The patient is asking to go home if the surgery will not be until Tuesday 9/3, however Cardiology would like him to remain inpatient.     Nicholes Rough, PA-C 07/30/2018 2:48 PM    I have seen and examined the patient and agree with the assessment as outlined above by Nicholes Rough, PA-C.  Patient is a 72 year old African-American male with no previous history of coronary artery disease but risk factors notable for history of hypertension, hyperlipidemia, and long-standing tobacco abuse.  He was in his usual state of health until 2 days ago when he developed new onset substernal chest discomfort shortly after supper.  Symptoms improved by the time the patient was evaluated in the emergency department and baseline EKG revealed sinus rhythm without acute EKG changes.  However, troponins were positive and the patient subsequently ruled in for an acute non-ST segment elevation myocardial infarction with peak troponin level 3.65.  The patient has been pain-free since admission.  I have personally reviewed the patient's transthoracic echocardiogram and diagnostic cardiac catheterization.  Echocardiogram demonstrates normal left ventricular systolic function.  Ejection fraction was estimated 60 to 65%.  There is some left  ventricular hypertrophy.  The ascending aorta looks mildly dilated.  There is no significant valvular disease.  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  There is smooth tapered stenosis of the distal left main coronary artery which was interpreted 50%.  I am underwhelmed by the radiographic appearance of the left main coronary artery.  There is long segment 95 to 99% ostial stenosis of the ramus intermediate branch which is the likely culprit lesion regarding the patient's acute presentation.  There is short segment smooth narrowing of the proximal left anterior descending coronary artery which was graded 60%, but again I am underwhelmed by the appearance.  The right coronary artery is relatively small and diffusely diseased with codominant  coronary circulation.  There is 100% chronic occlusion of the distal right coronary artery.  There do not appear to be any suitable target vessels for grafting in the right coronary territory.  I have discussed the nature of the patient's acute presentation and findings on his diagnostic cardiac catheterization at length with the patient at the bedside this evening.  We discussed multiple options for treatment including continued medical therapy, PCI and stenting, and coronary artery bypass grafting.  Presuming that the patient's left main and left anterior descending coronary artery stenoses are hemodynamically significant, I agree that surgical revascularization would be the best choice.  Risks associated with coronary artery bypass grafting should be relatively low.  However, I am somewhat underwhelmed by the radiographic appearance of the patient's diagnostic cardiac catheterization.  Flow wire analysis of the left main and left anterior descending coronary artery might be helpful.  If the patient does not have hemodynamically significant disease in these vessels then it might be reasonable to consider PCI and stenting of the  ramus intermediate branch.  On the other hand, if the left main and left anterior descending coronary artery disease is hemodynamically significant the patient should be treated with surgical revascularization.     I spent in excess of 90 minutes during the conduct of this hospital encounter and >50% of this time involved direct face-to-face encounter with the patient for counseling and/or coordination of their care.     Rexene Alberts, MD 07/31/2018 6:20 PM

## 2018-07-30 NOTE — Interval H&P Note (Signed)
Cath Lab Visit (complete for each Cath Lab visit)  Clinical Evaluation Leading to the Procedure:   ACS: Yes.    Non-ACS:    Anginal Classification: CCS IV  Anti-ischemic medical therapy: Minimal Therapy (1 class of medications)  Non-Invasive Test Results: No non-invasive testing performed  Prior CABG: No previous CABG      History and Physical Interval Note:  07/30/2018 7:32 AM  Timothy [place]  has presented today for surgery, with the diagnosis of cp  The various methods of treatment have been discussed with the patient and family. After consideration of risks, benefits and other options for treatment, the patient has consented to  Procedure(s): LEFT HEART CATH AND CORONARY ANGIOGRAPHY (N/A) as a surgical intervention .  The patient's history has been reviewed, patient examined, no change in status, stable for surgery.  I have reviewed the patient's chart and labs.  Questions were answered to the patient's satisfaction.     Larae Grooms

## 2018-07-30 NOTE — Progress Notes (Signed)
ANTICOAGULATION CONSULT NOTE - Follow Up Consult  Pharmacy Consult for Heparin Indication: chest pain/ACS, s/p cath, awaiting CVTS consult  No Known Allergies  Patient Measurements: Height: 6\' 1"  (185.4 cm) Weight: 191 lb 11.2 oz (87 kg) IBW/kg (Calculated) : 79.9  Vital Signs: Temp: 99 F (37.2 C) (08/29 2038) Temp Source: Oral (08/29 2038) BP: 131/85 (08/29 2038) Pulse Rate: 85 (08/29 2038)  Labs: Recent Labs    07/28/18 2210  07/29/18 0139 07/29/18 0511  07/29/18 1152 07/29/18 1433 07/29/18 2208 07/30/18 0355 07/30/18 2209  HGB 14.0  --   --   --   --   --   --   --   --   --   HCT 41.9  --   --   --   --   --   --   --   --   --   PLT 206  --   --   --   --   --   --   --   --   --   APTT  --   --  30  --   --   --   --   --   --   --   LABPROT  --   --  12.8  --   --   --   --   --   --   --   INR  --   --  0.97  --   --   --   --   --   --   --   HEPARINUNFRC  --   --   --   --    < >  --  0.39  --  0.14* 0.18*  CREATININE 1.15  --   --   --   --   --   --   --   --   --   TROPONINI <0.03   < >  --  1.28*  --  3.65*  --  2.75*  --   --    < > = values in this interval not displayed.    Estimated Creatinine Clearance: 68.5 mL/min (by C-G formula based on SCr of 1.15 mg/dL).  Assessment: 72 year old male continues on heparin for chest pain. Trop peak at 3.65. No anticoag PTA.  Underwent cath on 8/29 finding multivessel dx and L main aneurysmal area - now awaiting cardiac surgery. Heparin level this morning came back subtherapeutic at 0.14, of note IV was pulled and heparin resumed ~2 hrs before level drawn. No s/sx of bleeding. CBC stable.   Plan to restart heparin infusion 8 hours post-sheath removal (removed on 8/29@0810 ).   8/29 PM update: heparin level low tonight after re-start s/p cath, no issues per RN  Goal of Therapy:  Heparin level 0.3-0.7 units/ml Monitor platelets by anticoagulation protocol: Yes   Plan:  Inc heparin to 1450 units/hr Heparin  level with AM labs F/u cardiac surgery eval  Narda Bonds, PharmD, BCPS Clinical Pharmacist Phone: [phone]

## 2018-07-30 NOTE — Progress Notes (Signed)
   Brief post-cath note --> I went to see the patient following his catheterization to discuss the results with he and his wife/family.  He did not remember any of the procedure results.    He remains chest pain-free and reluctant to stay, however I did explain to him that he has essentially left main and multivessel disease and will require bypass surgery.  I do not think that it be a wise choice for him to be discharged home.  We will actually restart IV heparin 6 hours after TR band removal.   He is otherwise on pretty good regimen from a medical standpoint with statin, carvedilol and Avapro.  CT surgery has been consulted.  We await their recommendations.     Glenetta Hew, MD .

## 2018-07-30 NOTE — Progress Notes (Signed)
ANTICOAGULATION CONSULT NOTE - Follow Up Consult  Pharmacy Consult for Heparin Indication: chest pain/ACS  No Known Allergies  Patient Measurements: Height: 6\' 1"  (185.4 cm) Weight: 191 lb 11.2 oz (87 kg) IBW/kg (Calculated) : 79.9  Vital Signs: Temp: 97.9 F (36.6 C) (08/29 0448) Temp Source: Oral (08/29 0448) BP: 143/89 (08/29 0900) Pulse Rate: 62 (08/29 0900)  Labs: Recent Labs    07/28/18 2210  07/29/18 0139 07/29/18 0511 07/29/18 0822 07/29/18 1152 07/29/18 1433 07/29/18 2208 07/30/18 0355  HGB 14.0  --   --   --   --   --   --   --   --   HCT 41.9  --   --   --   --   --   --   --   --   PLT 206  --   --   --   --   --   --   --   --   APTT  --   --  30  --   --   --   --   --   --   LABPROT  --   --  12.8  --   --   --   --   --   --   INR  --   --  0.97  --   --   --   --   --   --   HEPARINUNFRC  --   --   --   --  0.42  --  0.39  --  0.14*  CREATININE 1.15  --   --   --   --   --   --   --   --   TROPONINI <0.03   < >  --  1.28*  --  3.65*  --  2.75*  --    < > = values in this interval not displayed.    Estimated Creatinine Clearance: 68.5 mL/min (by C-G formula based on SCr of 1.15 mg/dL).  Assessment: 72 year old male continues on heparin for chest pain. Trop peak at 3.65. No anticoag PTA.  Underwent cath on 8/29 finding multivessel dx and L main aneurysmal area - now awaiting cardiac surgery. Heparin level this morning came back subtherapeutic at 0.14, of note IV was pulled and heparin resumed ~2 hrs before level drawn. No s/sx of bleeding. CBC stable.   Plan to restart heparin infusion 8 hours post-sheath removal (removed on 8/29@0810 ).   Goal of Therapy:  Heparin level 0.3-0.7 units/ml Monitor platelets by anticoagulation protocol: Yes   Plan:  Increase heparin infusion to 1300 units/hr given subtherapeutic level this morning - restart on 8/29@1615  Obtain 6 hour heparin level  Monitor daily HL, CBC, for s/sx of bleeding F/u cardiac surgery  eval  Thank you Doylene Canard, PharmD Clinical Pharmacist  Pager: [phone] Phone: [phone] 07/30/2018,9:29 AM

## 2018-07-31 ENCOUNTER — Inpatient Hospital Stay (HOSPITAL_COMMUNITY): Payer: Medicare HMO

## 2018-07-31 ENCOUNTER — Other Ambulatory Visit (HOSPITAL_COMMUNITY): Payer: Medicare HMO

## 2018-07-31 LAB — HEPARIN LEVEL (UNFRACTIONATED)
HEPARIN UNFRACTIONATED: 0.56 [IU]/mL (ref 0.30–0.70)
Heparin Unfractionated: 0.45 [IU]/mL (ref 0.30–0.70)

## 2018-07-31 LAB — URINALYSIS, COMPLETE (UACMP) WITH MICROSCOPIC
BACTERIA UA: NONE SEEN
Bilirubin Urine: NEGATIVE
GLUCOSE, UA: NEGATIVE mg/dL
Hgb urine dipstick: NEGATIVE
KETONES UR: NEGATIVE mg/dL
LEUKOCYTES UA: NEGATIVE
NITRITE: NEGATIVE
PROTEIN: NEGATIVE mg/dL
Specific Gravity, Urine: 1.014 (ref 1.005–1.030)
pH: 6 (ref 5.0–8.0)

## 2018-07-31 LAB — CBC
HCT: 40.5 % (ref 39.0–52.0)
Hemoglobin: 13.3 g/dL (ref 13.0–17.0)
MCH: 28.8 pg (ref 26.0–34.0)
MCHC: 32.8 g/dL (ref 30.0–36.0)
MCV: 87.7 fL (ref 78.0–100.0)
Platelets: 215 K/uL (ref 150–400)
RBC: 4.62 MIL/uL (ref 4.22–5.81)
RDW: 12.9 % (ref 11.5–15.5)
WBC: 5.6 K/uL (ref 4.0–10.5)

## 2018-07-31 NOTE — Progress Notes (Signed)
TCTS BRIEF PROGRESS NOTE    I have reviewed the patient's diagnostic cardiac catheterization films with Dr. Burt Knack and will discuss with other team members.  Repeat catheterization with flow wire analysis could be performed on Tuesday with plans to proceed with PCI and stenting of FloWire is negative.  If the flow wire is positive coronary artery bypass surgery could be planned for Wednesday.  Will get CTA to evaluate ascending thoracic aorta, which appears somewhat dilated on ECHO    Rexene Alberts, MD 07/31/2018 7:03 PM

## 2018-07-31 NOTE — Progress Notes (Signed)
ANTICOAGULATION CONSULT NOTE - Follow Up Consult  Pharmacy Consult for Heparin Indication: chest pain/ACS, s/p cath, likely CABG  No Known Allergies  Patient Measurements: Height: 6\' 1"  (185.4 cm) Weight: 193 lb 12.8 oz (87.9 kg) IBW/kg (Calculated) : 79.9  Vital Signs: Temp: 98.5 F (36.9 C) (08/30 0437) Temp Source: Oral (08/30 0437) BP: 123/82 (08/30 0437) Pulse Rate: 69 (08/30 0437)  Labs: Recent Labs    07/28/18 2210  07/29/18 0139 07/29/18 0511  07/29/18 1152  07/29/18 2208 07/30/18 0355 07/30/18 2209 07/31/18 0509  HGB 14.0  --   --   --   --   --   --   --   --   --  13.3  HCT 41.9  --   --   --   --   --   --   --   --   --  40.5  PLT 206  --   --   --   --   --   --   --   --   --  215  APTT  --   --  30  --   --   --   --   --   --   --   --   LABPROT  --   --  12.8  --   --   --   --   --   --   --   --   INR  --   --  0.97  --   --   --   --   --   --   --   --   HEPARINUNFRC  --   --   --   --    < >  --    < >  --  0.14* 0.18* 0.45  CREATININE 1.15  --   --   --   --   --   --   --   --   --   --   TROPONINI <0.03   < >  --  1.28*  --  3.65*  --  2.75*  --   --   --    < > = values in this interval not displayed.    Estimated Creatinine Clearance: 68.5 mL/min (by C-G formula based on SCr of 1.15 mg/dL).  Assessment: 72 year old male continues on heparin for chest pain. Trop peak at 3.65. No anticoag PTA.  Underwent cath on 8/29 finding multivessel dx and L main aneurysmal area - now awaiting cardiac surgery. Heparin level this morning came back subtherapeutic at 0.14, of note IV was pulled and heparin resumed ~2 hrs before level drawn. No s/sx of bleeding. CBC stable.   Plan to restart heparin infusion 8 hours post-sheath removal (removed on 8/29@0810 ).   8/30 AM update: heparin level therapeutic x 1 after rate increase  Goal of Therapy:  Heparin level 0.3-0.7 units/ml Monitor platelets by anticoagulation protocol: Yes   Plan:  Cont heparin at  1450 units/hr 1200 heparin level Likely CABG 9/3  Narda Bonds, PharmD, [place] Clinical Pharmacist Phone: [phone]

## 2018-07-31 NOTE — Progress Notes (Signed)
Progress Note  Patient Name: Timothy Simpson Date of Encounter: 07/31/2018  Primary Cardiologist: Glenetta Hew, MD  Subjective   No chest pain or shortness of breath.  Was hoping to go home and come back for bypass surgery.  Inpatient Medications    Scheduled Meds: . allopurinol  100 mg Oral Daily  . aspirin EC  81 mg Oral Daily  . atorvastatin  80 mg Oral q1800  . carvedilol  6.25 mg Oral BID WC  . irbesartan  150 mg Oral Daily  . sodium chloride flush  3 mL Intravenous Q12H   Continuous Infusions: . sodium chloride    . heparin 1,450 Units/hr (07/31/18 0524)   PRN Meds: sodium chloride, acetaminophen, nitroGLYCERIN, ondansetron (ZOFRAN) IV, sodium chloride flush   Vital Signs    Vitals:   07/30/18 1239 07/30/18 2038 07/31/18 0437 07/31/18 0803  BP: 125/79 131/85 123/82 113/80  Pulse: 70 85 69 62  Resp: 20 18 18    Temp: 98.6 F (37 C) 99 F (37.2 C) 98.5 F (36.9 C)   TempSrc: Oral Oral Oral   SpO2: 96% 98% 95%   Weight:   87.9 kg   Height:        Intake/Output Summary (Last 24 hours) at 07/31/2018 1029 Last data filed at 07/31/2018 0800 Gross per 24 hour  Intake 840.3 ml  Output 675 ml  Net 165.3 ml   Filed Weights   07/29/18 0505 07/30/18 0500 07/31/18 0437  Weight: 88.2 kg 87 kg 87.9 kg    Telemetry    Sinus rhythm, no significant ectopy - Personally Reviewed  ECG    8/28, sinus rhythm, heart rate 91, lateral ST depression improved from 8/27- Personally Reviewed  Physical Exam   General: Well developed, well nourished, male in no acute distress Head: Eyes PERRLA, No xanthomas.   Normocephalic and atraumatic  Lungs: Clear bilaterally to auscultation. Heart: HRRR S1 S2, 2/6 SEM.  Pulses are 2+ & equal.   No JVD. Abdomen: Bowel sounds are present, abdomen soft and non-tender without masses or  hernias noted. Msk: Normal strength and tone for age. Extremities: No clubbing, cyanosis or edema.  Right radial cath site with good distal pulse  and no significant ecchymosis Skin:  No rashes or lesions noted. Neuro: Alert and oriented X 3. Psych:  Good affect, responds appropriately   Labs    Chemistry Recent Labs  Lab 07/28/18 2210  NA 141  K 3.9  CL 102  CO2 31  GLUCOSE 124*  BUN 8  CREATININE 1.15  CALCIUM 9.4  GFRNONAA >60  GFRAA >60  ANIONGAP 8     Hematology Recent Labs  Lab 07/28/18 2210 07/31/18 0509  WBC 5.5 5.6  RBC 4.69 4.62  HGB 14.0 13.3  HCT 41.9 40.5  MCV 89.3 87.7  MCH 29.9 28.8  MCHC 33.4 32.8  RDW 13.3 12.9  PLT 206 215    Cardiac Enzymes Recent Labs  Lab 07/29/18 0034 07/29/18 0511 07/29/18 1152 07/29/18 2208  TROPONINI 0.29* 1.28* 3.65* 2.75*   Lab Results  Component Value Date   CHOL 166 07/30/2018   HDL 37 (L) 07/30/2018   LDLCALC 109 (H) 07/30/2018   TRIG 100 07/30/2018   CHOLHDL 4.5 07/30/2018     Radiology    No results found.  Cardiac Studies   CARDIAC CATH: 07/30/2018  Mid LM to Dist LM lesion is 50% stenosed. 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  Ost Ramus lesion is 95% stenosed.  Lat Ramus lesion is 99% stenosed.  Dist RCA lesion is 100% stenosed.  Mid Cx lesion is 70% stenosed.  Prox LAD lesion is 60% stenosed.  The left ventricular systolic function is normal.  LV end diastolic pressure is normal. LVEDP 6 mm Hg.  The left ventricular ejection fraction is 55-65% by visual estimate.  There is no aortic valve stenosis.   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   ECHO: 07/29/2018 - Left ventricle: The cavity size was normal. There was mild   concentric hypertrophy. Systolic function was normal. The   estimated ejection fraction was in the range of 60% to 65%. Wall   motion was normal; there were no regional  wall motion   abnormalities. There was an increased relative contribution of   atrial contraction to ventricular filling. Doppler parameters are   consistent with abnormal left ventricular relaxation (grade 1   diastolic dysfunction). - Mitral valve: There was mild regurgitation. - Atrial septum: There was increased thickness of the septum,   consistent with lipomatous hypertrophy. - Tricuspid valve: There was mild regurgitation.  Patient Profile     72 y.o. male with PMH of HTN, gout, HL and tobacco use who presented with chest pressure to [facility]. Found to have elevated troponins and transferred to [facility] for cardiac cath.   Assessment & Plan    1. NSTEMI:  -Cath 8/29>>CABG next week -Pain-free on heparin, ASA 81 mg, high-dose statin, Coreg 6.25 mg twice daily, irbesartan 150 mg daily.  Add nitrates if needed for chest pain. -EF preserved by echo - MD to review the films and advise if he can be discharged and come back for surgery, but with left main disease, RCA 100% and significant circumflex disease, feel he will likely need to stay.  2. HTN:  -SBP 110s-130s on current therapy, follow -Beta-blocker uptitrated with no significant bradycardia  3. HL:  - LDL 109, goal less than 70 -Continue high-dose statin, started this admission  4. Tobacco use: Continue to encourage cessation, patient is not requesting a nicotine patch.   SignedRosaria Ferries, PA-C  07/31/2018, 10:29 AM  Pager # [phone]

## 2018-07-31 NOTE — Progress Notes (Signed)
ANTICOAGULATION CONSULT NOTE - Follow Up Consult  Pharmacy Consult for Heparin Indication: chest pain/ACS, s/p cath, likely CABG  No Known Allergies  Patient Measurements: Height: 6\' 1"  (185.4 cm) Weight: 193 lb 12.8 oz (87.9 kg) IBW/kg (Calculated) : 79.9  Vital Signs: Temp: 98.3 F (36.8 C) (08/30 1133) Temp Source: Oral (08/30 1133) BP: 103/70 (08/30 1133) Pulse Rate: 65 (08/30 1133)  Labs: Recent Labs    07/28/18 2210  07/29/18 0139 07/29/18 0511  07/29/18 1152  07/29/18 2208  07/30/18 2209 07/31/18 0509 07/31/18 1159  HGB 14.0  --   --   --   --   --   --   --   --   --  13.3  --   HCT 41.9  --   --   --   --   --   --   --   --   --  40.5  --   PLT 206  --   --   --   --   --   --   --   --   --  215  --   APTT  --   --  30  --   --   --   --   --   --   --   --   --   LABPROT  --   --  12.8  --   --   --   --   --   --   --   --   --   INR  --   --  0.97  --   --   --   --   --   --   --   --   --   HEPARINUNFRC  --   --   --   --    < >  --    < >  --    < > 0.18* 0.45 0.56  CREATININE 1.15  --   --   --   --   --   --   --   --   --   --   --   TROPONINI <0.03   < >  --  1.28*  --  3.65*  --  2.75*  --   --   --   --    < > = values in this interval not displayed.    Estimated Creatinine Clearance: 68.5 mL/min (by C-G formula based on SCr of 1.15 mg/dL).  Assessment: 72 year old male continues on heparin for chest pain. Trop peak at 3.65. No anticoag PTA.  Underwent cath on 8/29 finding multivessel dx and L main aneurysmal area - now awaiting cardiac surgery.   Heparin level came back therapeutic at 0.56, on 1450 units/hr. Hgb 13.3, plt 215. No s/sx of bleeding. No infusion issues.  Goal of Therapy:  Heparin level 0.3-0.7 units/ml Monitor platelets by anticoagulation protocol: Yes   Plan:  Continue heparin at 1450 units/hr Monitor daily HL, CBC, for s/sx of bleeding Likely CABG 9/3  Doylene Canard, PharmD Clinical Pharmacist  Pager:  [phone] Phone: [phone]

## 2018-07-31 NOTE — Progress Notes (Signed)
CARDIAC REHAB PHASE I   Preop ed completed with pt and family. Pt given IS and demonstrated 1250. Encouraged pt to use over the weekend to help prepare his lungs for surgery. Pt educated on sternal precautions, in-the-tube sheet given. Reviewed [facility] care guide. Pt given Cardiac Surgery booklet. Pt continues to ask to go home before surgery. Pt strongly encouraged to just stay in hospital and have a relaxing weekend.  [place], RN BSN 07/31/2018 2:14 PM

## 2018-08-01 ENCOUNTER — Inpatient Hospital Stay (HOSPITAL_COMMUNITY): Payer: Medicare HMO

## 2018-08-01 ENCOUNTER — Encounter (HOSPITAL_COMMUNITY): Payer: Self-pay | Admitting: Radiology

## 2018-08-01 LAB — HEMOGLOBIN A1C
Hgb A1c MFr Bld: 6.1 % — ABNORMAL HIGH (ref 4.8–5.6)
Mean Plasma Glucose: 128.37 mg/dL

## 2018-08-01 LAB — COMPREHENSIVE METABOLIC PANEL
ALBUMIN: 3.5 g/dL (ref 3.5–5.0)
ALT: 13 U/L (ref 0–44)
AST: 22 U/L (ref 15–41)
Alkaline Phosphatase: 73 U/L (ref 38–126)
Anion gap: 9 (ref 5–15)
BILIRUBIN TOTAL: 0.9 mg/dL (ref 0.3–1.2)
BUN: 12 mg/dL (ref 8–23)
CO2: 28 mmol/L (ref 22–32)
Calcium: 9.3 mg/dL (ref 8.9–10.3)
Chloride: 103 mmol/L (ref 98–111)
Creatinine, Ser: 0.99 mg/dL (ref 0.61–1.24)
GFR calc non Af Amer: >60 mL/min (ref >60)
GLUCOSE: 98 mg/dL (ref 70–99)
POTASSIUM: 3.9 mmol/L (ref 3.5–5.1)
SODIUM: 140 mmol/L (ref 135–145)
TOTAL PROTEIN: 6.4 g/dL — AB (ref 6.5–8.1)

## 2018-08-01 LAB — BLOOD GAS, ARTERIAL
Acid-Base Excess: 3.5 mmol/L — ABNORMAL HIGH (ref 0.0–2.0)
Bicarbonate: 27.7 mmol/L (ref 20.0–28.0)
DRAWN BY: 51831
FIO2: 0.21
O2 SAT: 93.6 %
PO2 ART: 69.9 mmHg — AB (ref 83.0–108.0)
Patient temperature: 98.6
pCO2 arterial: 43.2 mmHg (ref 32.0–48.0)
pH, Arterial: 7.423 (ref 7.350–7.450)

## 2018-08-01 LAB — APTT: aPTT: 113 seconds — ABNORMAL HIGH (ref 24–36)

## 2018-08-01 LAB — CBC
HCT: 41.7 % (ref 39.0–52.0)
HEMOGLOBIN: 13.9 g/dL (ref 13.0–17.0)
MCH: 29.3 pg (ref 26.0–34.0)
MCHC: 33.3 g/dL (ref 30.0–36.0)
MCV: 87.8 fL (ref 78.0–100.0)
Platelets: 213 10*3/uL (ref 150–400)
RBC: 4.75 MIL/uL (ref 4.22–5.81)
RDW: 12.9 % (ref 11.5–15.5)
WBC: 5.5 10*3/uL (ref 4.0–10.5)

## 2018-08-01 LAB — HEPARIN LEVEL (UNFRACTIONATED)
HEPARIN UNFRACTIONATED: 0.71 [IU]/mL — AB (ref 0.30–0.70)
Heparin Unfractionated: 0.7 IU/mL (ref 0.30–0.70)

## 2018-08-01 LAB — PREALBUMIN: PREALBUMIN: 19.6 mg/dL (ref 18–38)

## 2018-08-01 LAB — PROTIME-INR
INR: 1.1
Prothrombin Time: 14.1 seconds (ref 11.4–15.2)

## 2018-08-01 MED ORDER — IOPAMIDOL (ISOVUE-370) INJECTION 76%
100.0000 mL | Freq: Once | INTRAVENOUS | Status: AC | PRN
  Administered 2018-08-01: 100 mL via INTRAVENOUS

## 2018-08-01 NOTE — Progress Notes (Signed)
ANTICOAGULATION CONSULT NOTE - Follow Up Consult  Pharmacy Consult for Heparin Indication: chest pain/ACS, s/p cath, likely CABG  No Known Allergies  Patient Measurements: Height: 6\' 1"  (185.4 cm) Weight: 192 lb 8 oz (87.3 kg)(scale a) IBW/kg (Calculated) : 79.9  Vital Signs: Temp: 97.9 F (36.6 C) (08/31 1227) Temp Source: Oral (08/31 1227) BP: 114/73 (08/31 1227) Pulse Rate: 66 (08/31 1227)  Labs: Recent Labs    07/29/18 2208  07/31/18 0509 07/31/18 1159 08/01/18 0523 08/01/18 1355  HGB  --   --  13.3  --  13.9  --   HCT  --   --  40.5  --  41.7  --   PLT  --   --  215  --  213  --   APTT  --   --   --   --  113*  --   LABPROT  --   --   --   --  14.1  --   INR  --   --   --   --  1.10  --   HEPARINUNFRC  --    < > 0.45 0.56 0.70 0.71*  CREATININE  --   --   --   --  0.99  --   TROPONINI 2.75*  --   --   --   --   --    < > = values in this interval not displayed.    Estimated Creatinine Clearance: 79.6 mL/min (by C-G formula based on SCr of 0.99 mg/dL).  Assessment: 72 year old male continues on heparin for chest pain and cardiac workup. Trop peak at 3.65. No anticoag PTA.  Underwent cath on 8/29 finding multivessel dx and L main aneurysmal area - now awaiting cardiac surgery.   Heparin level came back at the higher end of therapeutic this morning at 0.7, on 1450 units/hr. CBC stable. No s/sx of bleeding. No infusion issues.   8/31 afternoon update: heparin level remains slightly elevated at 0.71 on 1400 units/hr.  Goal of Therapy:  Heparin level 0.3-0.7 units/ml Monitor platelets by anticoagulation protocol: Yes   Plan:  Decrease heparin drip to 1300 units/hr Check heparin level in 6 hours Likely CABG 9/3  Vertis Kelch, PharmD PGY1 Pharmacy Resident Phone [phone] 08/01/2018       2:44 PM

## 2018-08-01 NOTE — Progress Notes (Signed)
MD Caryl Comes providing update to pt family per request

## 2018-08-01 NOTE — Progress Notes (Signed)
Family at bedside requesting to speak to MD  Paged NP to inform

## 2018-08-01 NOTE — Progress Notes (Signed)
Progress Note  Patient Name: Timothy Simpson Date of Encounter: 08/01/2018  Primary Cardiologist: Glenetta Hew, MD   Patient Profile     72 y.o. male with PMH of HTN, gout, HL and tobacco use who presented with chest pressure to [facility]. Found to have elevated troponins and Cardiac cath>>Mid LM to Dist LM lesion is 50% stenosed. 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  Ost Ramus lesion is 95% stenosed.  Lat Ramus lesion is 99% stenosed.  Dist RCA lesion is 100% stenosed.  Mid Cx lesion is 70% stenosed.  Prox LAD lesion is 60% stenosed.  EF normal CT surg  Consult recommended FFR across LM scheduled for Tuesday CTA of aorta pending   Subjective   Without discomfort.  His anginal equivalent with abdominal discomfort.  No shortness of breath.  Poor understanding of situation; reviewed in detail.  Inpatient Medications    Scheduled Meds: . allopurinol  100 mg Oral Daily  . aspirin EC  81 mg Oral Daily  . atorvastatin  80 mg Oral q1800  . carvedilol  6.25 mg Oral BID WC  . irbesartan  150 mg Oral Daily  . sodium chloride flush  3 mL Intravenous Q12H   Continuous Infusions: . sodium chloride    . heparin 1,400 Units/hr (08/01/18 0752)   PRN Meds: sodium chloride, acetaminophen, nitroGLYCERIN, ondansetron (ZOFRAN) IV, sodium chloride flush   Vital Signs    Vitals:   07/31/18 1133 07/31/18 1657 07/31/18 1943 08/01/18 0502  BP: 103/70 132/84 132/88 129/80  Pulse: 65 68 71 70  Resp: 20 20 18 18   Temp: 98.3 F (36.8 C) 98.7 F (37.1 C) 98.6 F (37 C) 98.2 F (36.8 C)  TempSrc: Oral Oral Oral Oral  SpO2: 100% 100% 97% 98%  Weight:    87.3 kg  Height:        Intake/Output Summary (Last 24 hours) at 08/01/2018 0911 Last data filed at 08/01/2018 6283 Gross per 24 hour  Intake 849.28 ml  Output 750 ml  Net 99.28 ml   Filed Weights   07/30/18 0500 07/31/18 0437 08/01/18 0502  Weight: 87 kg 87.9 kg 87.3 kg    Telemetry    Sinus rhythm without  ectopy- Personally Reviewed  ECG    8/28, sinus rhythm, heart rate 91, lateral ST depression improved from 8/27- Personally Reviewed No new  Physical Exam   Well developed and nourished in no acute distress HENT normal Neck supple with JVP-flat Clear Regular rate and rhythm, no murmurs or gallops Abd-soft with active BS No Clubbing cyanosis edema Skin-warm and dry A & Oriented  Grossly normal sensory and motor function   Labs    Chemistry Recent Labs  Lab 07/28/18 2210 08/01/18 0523  NA 141 140  K 3.9 3.9  CL 102 103  CO2 31 28  GLUCOSE 124* 98  BUN 8 12  CREATININE 1.15 0.99  CALCIUM 9.4 9.3  PROT  --  6.4*  ALBUMIN  --  3.5  AST  --  22  ALT  --  13  ALKPHOS  --  73  BILITOT  --  0.9  GFRNONAA >60 >60  GFRAA >60 >60  ANIONGAP 8 9     Hematology Recent Labs  Lab 07/28/18 2210 07/31/18 0509 08/01/18 0523  WBC 5.5 5.6 5.5  RBC 4.69 4.62 4.75  HGB 14.0 13.3 13.9  HCT 41.9 40.5 41.7  MCV 89.3 87.7 87.8  MCH 29.9 28.8 29.3  MCHC 33.4 32.8 33.3  RDW 13.3 12.9 12.9  PLT 206 215 213    Cardiac Enzymes Recent Labs  Lab 07/29/18 0034 07/29/18 0511 07/29/18 1152 07/29/18 2208  TROPONINI 0.29* 1.28* 3.65* 2.75*   Lab Results  Component Value Date   CHOL 166 07/30/2018   HDL 37 (L) 07/30/2018   LDLCALC 109 (H) 07/30/2018   TRIG 100 07/30/2018   CHOLHDL 4.5 07/30/2018     Radiology    Dg Chest 2 View  Result Date: 07/31/2018 CLINICAL DATA:  Chest pain EXAM: CHEST - 2 VIEW COMPARISON:  07/28/2018 FINDINGS: Cardiac shadow is stable. The lungs are well aerated bilaterally. No focal infiltrate or sizable effusion is seen. Degenerative changes of the thoracic spine are noted. IMPRESSION: No acute abnormality noted.  No change from the prior study. Electronically Signed   By: Inez Catalina M.D.   On: 07/31/2018 12:17    Cardiac Studies   CARDIAC CATH: 07/30/2018  Mid LM to Dist LM lesion is 50% stenosed. Aneurysmal section after the distal left  main disease.  Ost Ramus lesion is 95% stenosed.  Lat Ramus lesion is 99% stenosed.  Dist RCA lesion is 100% stenosed.  Mid Cx lesion is 70% stenosed.  Prox LAD lesion is 60% stenosed.  The left ventricular systolic function is normal.  LV end diastolic pressure is normal. LVEDP 6 mm Hg.  The left ventricular ejection fraction is 55-65% by visual estimate.  There is no aortic valve stenosis.   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   ECHO: 07/29/2018 - Left ventricle: The cavity size was normal. There was mild   concentric hypertrophy. Systolic function was normal. The   estimated ejection fraction was in the range of 60% to 65%. Wall   motion was normal; there were no regional wall motion   abnormalities. There was an increased relative contribution of   atrial contraction to ventricular filling. Doppler parameters are   consistent with abnormal left ventricular relaxation (grade 1   diastolic dysfunction). - Mitral valve: There was mild regurgitation. - Atrial septum: There was increased thickness of the septum,   consistent with lipomatous hypertrophy. - Tricuspid valve: There was mild regurgitation.  .   Assessment & Plan    1. NSTEMI:  -  2. HTN:  -SBP 110s-130s on current therapy, follow -Beta-blocker uptitrated with no significant bradycardia  3. HL:  - LDL 109, goal less than 70 -Continue high-dose statin, started this admission       High-grade coronary disease.  Plan is for FFR on Tuesday to evaluate left main lesion.  If significant will have surgery on Wednesday.  If not anticipate staged PCI.  Currently without symptoms heparin aspirin statins and beta-blockers.  Blood pressure well controlled

## 2018-08-01 NOTE — Progress Notes (Signed)
ANTICOAGULATION CONSULT NOTE - Follow Up Consult  Pharmacy Consult for Heparin Indication: chest pain/ACS, s/p cath, likely CABG  No Known Allergies  Patient Measurements: Height: 6\' 1"  (185.4 cm) Weight: 192 lb 8 oz (87.3 kg)(scale a) IBW/kg (Calculated) : 79.9  Vital Signs: Temp: 98.2 F (36.8 C) (08/31 0502) Temp Source: Oral (08/31 0502) BP: 129/80 (08/31 0502) Pulse Rate: 70 (08/31 0502)  Labs: Recent Labs    07/29/18 1152  07/29/18 2208  07/31/18 0509 07/31/18 1159 08/01/18 0523  HGB  --   --   --   --  13.3  --  13.9  HCT  --   --   --   --  40.5  --  41.7  PLT  --   --   --   --  215  --  213  APTT  --   --   --   --   --   --  113*  LABPROT  --   --   --   --   --   --  14.1  INR  --   --   --   --   --   --  1.10  HEPARINUNFRC  --    < >  --    < > 0.45 0.56 0.70  CREATININE  --   --   --   --   --   --  0.99  TROPONINI 3.65*  --  2.75*  --   --   --   --    < > = values in this interval not displayed.    Estimated Creatinine Clearance: 79.6 mL/min (by C-G formula based on SCr of 0.99 mg/dL).  Assessment: 72 year old male continues on heparin for chest pain and cardiac workup. Trop peak at 3.65. No anticoag PTA.  Underwent cath on 8/29 finding multivessel dx and L main aneurysmal area - now awaiting cardiac surgery.   Heparin level came back at the higher end of therapeutic at 0.7, on 1450 units/hr. CBC stable. No s/sx of bleeding. No infusion issues.   Goal of Therapy:  Heparin level 0.3-0.7 units/ml Monitor platelets by anticoagulation protocol: Yes   Plan:  Decrease heparin drip slightly to 1400 units/hr Check heparin level in 6 hours Likely CABG 9/3  Vertis Kelch, PharmD PGY1 Pharmacy Resident Phone [phone] 08/01/2018       7:22 AM

## 2018-08-02 ENCOUNTER — Other Ambulatory Visit (HOSPITAL_COMMUNITY): Payer: Medicare HMO

## 2018-08-02 LAB — CBC
HCT: 42.6 % (ref 39.0–52.0)
HEMOGLOBIN: 13.7 g/dL (ref 13.0–17.0)
MCH: 28.3 pg (ref 26.0–34.0)
MCHC: 32.2 g/dL (ref 30.0–36.0)
MCV: 88 fL (ref 78.0–100.0)
PLATELETS: 222 10*3/uL (ref 150–400)
RBC: 4.84 MIL/uL (ref 4.22–5.81)
RDW: 13.1 % (ref 11.5–15.5)
WBC: 7.2 10*3/uL (ref 4.0–10.5)

## 2018-08-02 LAB — HEPARIN LEVEL (UNFRACTIONATED): HEPARIN UNFRACTIONATED: 0.57 [IU]/mL (ref 0.30–0.70)

## 2018-08-02 NOTE — Progress Notes (Signed)
Patient resting comfortably during shift report. Denies complaints.  

## 2018-08-02 NOTE — Progress Notes (Signed)
Progress Note  Patient Name: Timothy Simpson Date of Encounter: 08/02/2018  Primary Cardiologist: Glenetta Hew, MD   Patient Profile     72 y.o. male with PMH of HTN, gout, HL and tobacco use who presented with chest pressure to [facility]. Found to have elevated troponins and Cardiac cath>>Mid LM to Dist LM lesion is 50% stenosed. 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  Ost Ramus lesion is 95% stenosed.  Lat Ramus lesion is 99% stenosed.  Dist RCA lesion is 100% stenosed.  Mid Cx lesion is 70% stenosed.  Prox LAD lesion is 60% stenosed.  EF normal CT surg  Consult recommended FFR across LM scheduled for Tuesday CTA of aorta pending   Subjective   Reviewed with pt and wife yesterday  nochest pain or shortness ofbreath nobleeding Bored   Inpatient Medications    Scheduled Meds: . allopurinol  100 mg Oral Daily  . aspirin EC  81 mg Oral Daily  . atorvastatin  80 mg Oral q1800  . carvedilol  6.25 mg Oral BID WC  . irbesartan  150 mg Oral Daily  . sodium chloride flush  3 mL Intravenous Q12H   Continuous Infusions: . sodium chloride    . heparin 1,300 Units/hr (08/01/18 1938)   PRN Meds: sodium chloride, acetaminophen, nitroGLYCERIN, ondansetron (ZOFRAN) IV, sodium chloride flush   Vital Signs    Vitals:   08/01/18 1227 08/01/18 1931 08/02/18 0431 08/02/18 0833  BP: 114/73 120/69 126/77 111/69  Pulse: 66 65 68 75  Resp: 20 20 20    Temp: 97.9 F (36.6 C) 98.7 F (37.1 C) 98.9 F (37.2 C)   TempSrc: Oral Oral Oral   SpO2: 96% 96% 93%   Weight:   86.7 kg   Height:        Intake/Output Summary (Last 24 hours) at 08/02/2018 1030 Last data filed at 08/02/2018 0900 Gross per 24 hour  Intake 1526.91 ml  Output 2200 ml  Net -673.09 ml   Filed Weights   07/31/18 0437 08/01/18 0502 08/02/18 0431  Weight: 87.9 kg 87.3 kg 86.7 kg    Telemetry    Personally reviewed  sinus  ECG    8/28, sinus rhythm, heart rate 91, lateral ST depression improved  from 8/27- Personally Reviewed No new  Physical Exam  Well developed and nourished in no acute distress HENT normal Neck supple with JVP-flat Clear Regular rate and rhythm, no murmurs or gallops Abd-soft with active BS No Clubbing cyanosis edema Skin-warm and dry A & Oriented  Grossly normal sensory and motor function   Labs    Chemistry Recent Labs  Lab 07/28/18 2210 08/01/18 0523  NA 141 140  K 3.9 3.9  CL 102 103  CO2 31 28  GLUCOSE 124* 98  BUN 8 12  CREATININE 1.15 0.99  CALCIUM 9.4 9.3  PROT  --  6.4*  ALBUMIN  --  3.5  AST  --  22  ALT  --  13  ALKPHOS  --  73  BILITOT  --  0.9  GFRNONAA >60 >60  GFRAA >60 >60  ANIONGAP 8 9     Hematology Recent Labs  Lab 07/31/18 0509 08/01/18 0523 08/02/18 0609  WBC 5.6 5.5 7.2  RBC 4.62 4.75 4.84  HGB 13.3 13.9 13.7  HCT 40.5 41.7 42.6  MCV 87.7 87.8 88.0  MCH 28.8 29.3 28.3  MCHC 32.8 33.3 32.2  RDW 12.9 12.9 13.1  PLT 215 213 222  Cardiac Enzymes Recent Labs  Lab 07/29/18 0034 07/29/18 0511 07/29/18 1152 07/29/18 2208  TROPONINI 0.29* 1.28* 3.65* 2.75*   Lab Results  Component Value Date   CHOL 166 07/30/2018   HDL 37 (L) 07/30/2018   LDLCALC 109 (H) 07/30/2018   TRIG 100 07/30/2018   CHOLHDL 4.5 07/30/2018     Radiology    Dg Chest 2 View  Result Date: 07/31/2018 CLINICAL DATA:  Chest pain EXAM: CHEST - 2 VIEW COMPARISON:  07/28/2018 FINDINGS: Cardiac shadow is stable. The lungs are well aerated bilaterally. No focal infiltrate or sizable effusion is seen. Degenerative changes of the thoracic spine are noted. IMPRESSION: No acute abnormality noted.  No change from the prior study. Electronically Signed   By: Inez Catalina M.D.   On: 07/31/2018 12:17   Ct Angio Chest/abd/pel For Dissection W And/or W/wo  Result Date: 08/01/2018 CLINICAL DATA:  Thoracic aortic dilatation by recent echo EXAM: CT ANGIOGRAPHY CHEST, ABDOMEN AND PELVIS TECHNIQUE: Multidetector CT imaging through the chest,  abdomen and pelvis was performed using the standard protocol during bolus administration of intravenous contrast. Multiplanar reconstructed images and MIPs were obtained and reviewed to evaluate the vascular anatomy. CONTRAST:  179mL ISOVUE-370 IOPAMIDOL (ISOVUE-370) INJECTION 76% COMPARISON:  07/31/2018 chest x-ray FINDINGS: CTA CHEST FINDINGS Cardiovascular: Initial noncontrast imaging demonstrates no significant hyperdense intramural hematoma. Atherosclerosis noted of the major branch vessels and thoracic aorta. Native coronary atherosclerosis present. Normal heart size. No pericardial effusion. Postcontrast, major branch vessels appear patent. Three-vessel arch anatomy noted. No significant thoracic aortic aneurysm. Negative for dissection. Ascending thoracic aorta maximal diameter 3.4 cm. The visualized pulmonary arteries appear patent. No significant pulmonary embolus by CTA. Mediastinum/Nodes: No enlarged mediastinal, hilar, or axillary lymph nodes. Thyroid gland, trachea, and esophagus demonstrate no significant findings. Lungs/Pleura: Mild upper lobe emphysema pattern. No focal pneumonia, collapse or consolidation. Minor basilar atelectasis. Negative for edema or interstitial disease. No pleural abnormality, effusion or pneumothorax. Central airways and trachea appear patent. Musculoskeletal: Degenerative changes of the spine. No acute osseous finding or compression fracture. Sternum intact. CTA ABDOMEN AND PELVIS FINDINGS VASCULAR Aorta: Atherosclerotic changes noted. Mild narrowing of the infrarenal aorta and bifurcation without occlusion. Negative for aneurysm, dissection, or significant occlusive disease. No retroperitoneal hemorrhage or hematoma. Celiac: Widely patent origin including its branches SMA: Widely patent origin including its branches Renals: Atherosclerotic origins but remain patent small accessory renal artery noted to the right kidney upper pole. IMA: Calcified atherosclerotic origin with  ostial narrowing but remains patent including its branches Inflow: Common, internal and external iliac arteries are atherosclerotic and mildly tortuous but remain patent. No iliac occlusion, dissection or aneurysm. Veins: Venous phase imaging not performed. Review of the MIP images confirms the above findings. NON-VASCULAR Hepatobiliary: No focal liver abnormality is seen. No gallstones, gallbladder wall thickening, or biliary dilatation. Pancreas: Unremarkable. No pancreatic ductal dilatation or surrounding inflammatory changes. Spleen: Arterial phase enhancement pattern. No significant focal abnormality. Adrenals/Urinary Tract: Normal adrenal glands. Scattered numerous bilateral hypodense renal cyst. No renal obstruction or hydronephrosis. Punctate nonobstructing subcentimeter intrarenal calculi bilaterally. Ureters are symmetric and decompressed no obstructing ureteral calculus, hydroureter, or definite bladder abnormality. Bladder is collapsed. Stomach/Bowel: Negative for bowel obstruction, significant dilatation, ileus, or free air. Normal appendix. Scattered colonic diverticulosis without acute inflammatory process. No fluid collection or abscess. Negative for ascites. Lymphatic: No adenopathy. Reproductive: Mild prostate enlargement with calcification. Symmetric seminal vesicles. No significant finding by CT. Other: No abdominal wall hernia or abnormality. No abdominopelvic ascites. Musculoskeletal: Degenerative changes  noted spine. No acute osseous finding. Review of the MIP images confirms the above findings. IMPRESSION: No significant thoracic aortic aneurysm. Maximal ascending thoracic aortic diameter 3.4 cm. No significant acute pulmonary embolus or other acute intrathoracic finding. Thoracic aortic atherosclerosis noted. Native coronary atherosclerosis. Abdominal atherosclerosis evident without aneurysm, dissection, or acute vascular process. No aortoiliac occlusive disease. Patent mesenteric and renal  vasculature. No acute intra-abdominal or pelvic finding. Nonobstructing nephrolithiasis bilaterally and bilateral renal cysts Electronically Signed   By: Jerilynn Mages.  Shick M.D.   On: 08/01/2018 11:02    Cardiac Studies   CARDIAC CATH: 07/30/2018  Mid LM to Dist LM lesion is 50% stenosed. 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  Ost Ramus lesion is 95% stenosed.  Lat Ramus lesion is 99% stenosed.  Dist RCA lesion is 100% stenosed.  Mid Cx lesion is 70% stenosed.  Prox LAD lesion is 60% stenosed.  The left ventricular systolic function is normal.  LV end diastolic pressure is normal. LVEDP 6 mm Hg.  The left ventricular ejection fraction is 55-65% by visual estimate.  There is no aortic valve stenosis.   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   ECHO: 07/29/2018 - Left ventricle: The cavity size was normal. There was mild   concentric hypertrophy. Systolic function was normal. The   estimated ejection fraction was in the range of 60% to 65%. Wall   motion was normal; there were no regional wall motion   abnormalities. There was an increased relative contribution of   atrial contraction to ventricular filling. Doppler parameters are   consistent with abnormal left ventricular relaxation (grade 1   diastolic dysfunction). - Mitral valve: There was mild regurgitation. - Atrial septum: There was increased thickness of the septum,   consistent with lipomatous hypertrophy. - Tricuspid valve: There was mild regurgitation.  .   Assessment & Plan    1. NSTEMI:  -  2. HTN:  -SBP 110s-130s on current therapy, follow -Beta-blocker uptitrated with no significant bradycardia  3. HL:  - LDL 109, goal less than 70 -Continue high-dose statin, started this  admission       High-grade coronary disease.  Plan is for FFR on Tuesday to evaluate left main lesion.  If significant will have surgery on Wednesday.  If not anticipate staged PCI.  Continue current meds

## 2018-08-02 NOTE — Progress Notes (Signed)
ANTICOAGULATION CONSULT NOTE - Follow Up Consult  Pharmacy Consult for Heparin Indication: chest pain/ACS, s/p cath, likely CABG  No Known Allergies  Patient Measurements: Height: 6\' 1"  (185.4 cm) Weight: 191 lb 1.6 oz (86.7 kg)(scale a) IBW/kg (Calculated) : 79.9   Vital Signs: Temp: 98.9 F (37.2 C) (09/01 0431) Temp Source: Oral (09/01 0431) BP: 126/77 (09/01 0431) Pulse Rate: 68 (09/01 0431)  Labs: Recent Labs    07/31/18 0509  08/01/18 0523 08/01/18 1355 08/02/18 0609  HGB 13.3  --  13.9  --  13.7  HCT 40.5  --  41.7  --  42.6  PLT 215  --  213  --  222  APTT  --   --  113*  --   --   LABPROT  --   --  14.1  --   --   INR  --   --  1.10  --   --   HEPARINUNFRC 0.45   < > 0.70 0.71* 0.57  CREATININE  --   --  0.99  --   --    < > = values in this interval not displayed.    Estimated Creatinine Clearance: 79.6 mL/min (by C-G formula based on SCr of 0.99 mg/dL).  Assessment: 72 year old male continues on heparin for chest pain and cardiac workup. Trop peak at 3.65. No anticoag PTA. Underwent cath on 8/29 finding multivessel dx and L main aneurysmal area - now awaiting cardiac surgery.   Heparin level this morning came back therapeutic at 0.57 on 1300 units/hr. CBC stable. Confirmed with nurse: no s/sx of bleeding. No infusion issues.   Goal of Therapy:  Heparin level 0.3-0.7 units/ml Monitor platelets by anticoagulation protocol: Yes   Plan:  Continue heparin drip to 1300 units/hr Monitor daily heparin levels, CBC, and s/sx of bleeding. Likely CABG 9/3  Thank you for allowing pharmacy to be a part of this patient's care.  Tamela Gammon, PharmD 08/02/2018 8:19 AM PGY-1 Pharmacy Resident Please check [email or web address] for unit-specific pharmacist phone numbers

## 2018-08-03 ENCOUNTER — Inpatient Hospital Stay (HOSPITAL_COMMUNITY): Payer: Medicare HMO

## 2018-08-03 DIAGNOSIS — E782 Mixed hyperlipidemia: Secondary | ICD-10-CM

## 2018-08-03 DIAGNOSIS — Z01810 Encounter for preprocedural cardiovascular examination: Secondary | ICD-10-CM

## 2018-08-03 LAB — CBC
HEMATOCRIT: 41.7 % (ref 39.0–52.0)
HEMOGLOBIN: 13.7 g/dL (ref 13.0–17.0)
MCH: 28.9 pg (ref 26.0–34.0)
MCHC: 32.9 g/dL (ref 30.0–36.0)
MCV: 88 fL (ref 78.0–100.0)
Platelets: 226 10*3/uL (ref 150–400)
RBC: 4.74 MIL/uL (ref 4.22–5.81)
RDW: 13.2 % (ref 11.5–15.5)
WBC: 6.5 10*3/uL (ref 4.0–10.5)

## 2018-08-03 LAB — HEPARIN LEVEL (UNFRACTIONATED): Heparin Unfractionated: 0.4 IU/mL (ref 0.30–0.70)

## 2018-08-03 NOTE — Progress Notes (Signed)
Pre-op Cardiac Surgery  Carotid Findings:  60-79% right ICA stenosis. 1-39% left ICA stenosis.  Vertebral artery flow is antegrade.   Upper Extremity Right Left  Brachial Pressures 104T 105T  Radial Waveforms T T  Ulnar Waveforms T T  Palmar Arch (Allen's Test) WNL Doppler signal remains normal with radial compression and diminishes >50% with ulnar compression.   Findings:      Lower  Extremity Right Left  Dorsalis Pedis 79M 72  Anterior Tibial    Posterior Tibial 110M 71  Ankle/Brachial Indices 0.70 0.69  TBI 0.30 0.17    Findings:  ABIs indicate moderate reduction in arterial blood flow with abnormal waveforms and abnormal TBIs.

## 2018-08-03 NOTE — Progress Notes (Addendum)
ANTICOAGULATION CONSULT NOTE - Follow Up Consult  Pharmacy Consult for Heparin Indication: chest pain/ACS, s/p cath, likely CABG  No Known Allergies  Patient Measurements: Height: 6\' 1"  (185.4 cm) Weight: 189 lb 14.4 oz (86.1 kg) IBW/kg (Calculated) : 79.9   Vital Signs: Temp: 98 F (36.7 C) (09/02 0440) Temp Source: Oral (09/02 0440) BP: 99/57 (09/02 0440) Pulse Rate: 67 (09/02 0440)  Labs: Recent Labs    08/01/18 0523 08/01/18 1355 08/02/18 0609 08/03/18 0422  HGB 13.9  --  13.7 13.7  HCT 41.7  --  42.6 41.7  PLT 213  --  222 226  APTT 113*  --   --   --   LABPROT 14.1  --   --   --   INR 1.10  --   --   --   HEPARINUNFRC 0.70 0.71* 0.57 0.40  CREATININE 0.99  --   --   --     Estimated Creatinine Clearance: 79.6 mL/min (by C-G formula based on SCr of 0.99 mg/dL).  Assessment: 72 year old male continues on heparin for chest pain and cardiac workup. Trop peak at 3.65. No anticoag PTA.  Underwent cath on 8/29 finding multivessel dx and L main aneurysmal area - now awaiting cardiac surgery.  Heparin level this morning therapeutic at 0.40 on 1300 units/hr. CBC stable. No bleeding per patient.  Goal of Therapy:  Heparin level 0.3-0.7 units/ml Monitor platelets by anticoagulation protocol: Yes   Plan:  Continue heparin drip at 1300 units/hr Monitor daily heparin levels, CBC, and s/sx of bleeding. FFR planned for 9/3  Harrietta Guardian, PharmD PGY1 Pharmacy Resident 08/03/2018    7:30 AM Please check [email or web address] for unit-specific pharmacist phone numbers

## 2018-08-03 NOTE — Progress Notes (Signed)
Progress Note  Patient Name: Timothy Simpson Date of Encounter: 08/03/2018  Primary Cardiologist: Glenetta Hew, MD   Subjective   The patient is asymptomatic even with walking.  Inpatient Medications    Scheduled Meds: . allopurinol  100 mg Oral Daily  . aspirin EC  81 mg Oral Daily  . atorvastatin  80 mg Oral q1800  . carvedilol  6.25 mg Oral BID WC  . irbesartan  150 mg Oral Daily  . sodium chloride flush  3 mL Intravenous Q12H   Continuous Infusions: . sodium chloride    . heparin 1,300 Units/hr (08/01/18 1938)   PRN Meds: sodium chloride, acetaminophen, nitroGLYCERIN, ondansetron (ZOFRAN) IV, sodium chloride flush   Vital Signs    Vitals:   08/02/18 1800 08/02/18 1957 08/03/18 0438 08/03/18 0440  BP: 124/75 114/80  (!) 99/57  Pulse: 77 77  67  Resp:  18  19  Temp:  99 F (37.2 C)  98 F (36.7 C)  TempSrc:  Oral  Oral  SpO2:  98%  98%  Weight:   86.1 kg   Height:        Intake/Output Summary (Last 24 hours) at 08/03/2018 1120 Last data filed at 08/03/2018 1027 Gross per 24 hour  Intake 1361.2 ml  Output 950 ml  Net 411.2 ml   Filed Weights   08/01/18 0502 08/02/18 0431 08/03/18 0438  Weight: 87.3 kg 86.7 kg 86.1 kg   Telemetry    SR - Personally Reviewed  ECG    SR, iRBBB, nonspecific ST T wave abnormalities - Personally Reviewed  Physical Exam   GEN: No acute distress.   Neck: No JVD Cardiac: RRR, no murmurs, rubs, or gallops.  Respiratory: Clear to auscultation bilaterally. GI: Soft, nontender, non-distended  MS: No edema; No deformity. Neuro:  Nonfocal  Psych: Normal affect   Labs    Chemistry Recent Labs  Lab 07/28/18 2210 08/01/18 0523  NA 141 140  K 3.9 3.9  CL 102 103  CO2 31 28  GLUCOSE 124* 98  BUN 8 12  CREATININE 1.15 0.99  CALCIUM 9.4 9.3  PROT  --  6.4*  ALBUMIN  --  3.5  AST  --  22  ALT  --  13  ALKPHOS  --  73  BILITOT  --  0.9  GFRNONAA >60 >60  GFRAA >60 >60  ANIONGAP 8 9      Hematology Recent Labs  Lab 08/01/18 0523 08/02/18 0609 08/03/18 0422  WBC 5.5 7.2 6.5  RBC 4.75 4.84 4.74  HGB 13.9 13.7 13.7  HCT 41.7 42.6 41.7  MCV 87.8 88.0 88.0  MCH 29.3 28.3 28.9  MCHC 33.3 32.2 32.9  RDW 12.9 13.1 13.2  PLT 213 222 226   Cardiac Enzymes Recent Labs  Lab 07/29/18 0034 07/29/18 0511 07/29/18 1152 07/29/18 2208  TROPONINI 0.29* 1.28* 3.65* 2.75*   No results for input(s): TROPIPOC in the last 168 hours.   BNPNo results for input(s): BNP, PROBNP in the last 168 hours.   DDimer No results for input(s): DDIMER in the last 168 hours.   Radiology    No results found.  Cardiac Studies   ECHO: 07/29/2018 - Left ventricle: The cavity size was normal. There was mild concentric hypertrophy. Systolic function was normal. The estimated ejection fraction was in the range of 60% to 65%. Wall motion was normal; there were no regional wall motion abnormalities. There was an increased relative contribution of atrial contraction to ventricular filling.  Doppler parameters are consistent with abnormal left ventricular relaxation (grade 1 diastolic dysfunction). - Mitral valve: There was mild regurgitation. - Atrial septum: There was increased thickness of the septum, consistent with lipomatous hypertrophy. - Tricuspid valve: There was mild regurgitation.   Patient Profile     72 y.o. male admitted with NSTEMI  Assessment & Plan    1. NSTEMI - max troponin 3.65, now downtrending - High-grade coronary disease.  Plan is for FFR on Tuesday to evaluate left main lesion.  If significant will have surgery on Wednesday.  If not anticipate staged PCI. - preserved LVEF  2. Hypertension - controlled on irbesartan and carvedilol  3. Hyperlipidemia - on high dose atorvastatin  For questions or updates, please contact [place] Please consult [email or web address] for contact info under Cardiology/STEMI.     Signed, Ena Dawley, MD   08/03/2018, 11:20 AM

## 2018-08-04 ENCOUNTER — Other Ambulatory Visit: Payer: Self-pay

## 2018-08-04 ENCOUNTER — Encounter (HOSPITAL_COMMUNITY): Payer: Self-pay | Admitting: Thoracic Surgery (Cardiothoracic Vascular Surgery)

## 2018-08-04 ENCOUNTER — Encounter (HOSPITAL_COMMUNITY)
Admission: EM | Disposition: A | Discharge status: Home / Self Care | Payer: Self-pay | Source: Home / Self Care | Attending: Internal Medicine

## 2018-08-04 DIAGNOSIS — Z720 Tobacco use: Secondary | ICD-10-CM | POA: Insufficient documentation

## 2018-08-04 DIAGNOSIS — I10 Essential (primary) hypertension: Secondary | ICD-10-CM | POA: Diagnosis present

## 2018-08-04 DIAGNOSIS — E785 Hyperlipidemia, unspecified: Secondary | ICD-10-CM | POA: Diagnosis present

## 2018-08-04 DIAGNOSIS — M109 Gout, unspecified: Secondary | ICD-10-CM | POA: Diagnosis present

## 2018-08-04 DIAGNOSIS — I2510 Atherosclerotic heart disease of native coronary artery without angina pectoris: Secondary | ICD-10-CM | POA: Diagnosis present

## 2018-08-04 HISTORY — PX: CORONARY PRESSURE/FFR STUDY: CATH118243

## 2018-08-04 HISTORY — PX: INTRAVASCULAR PRESSURE WIRE/FFR STUDY: CATH118243

## 2018-08-04 LAB — CBC
HCT: 43 % (ref 39.0–52.0)
HEMOGLOBIN: 14 g/dL (ref 13.0–17.0)
MCH: 28.8 pg (ref 26.0–34.0)
MCHC: 32.6 g/dL (ref 30.0–36.0)
MCV: 88.5 fL (ref 78.0–100.0)
PLATELETS: 219 10*3/uL (ref 150–400)
RBC: 4.86 MIL/uL (ref 4.22–5.81)
RDW: 12.9 % (ref 11.5–15.5)
WBC: 5.5 10*3/uL (ref 4.0–10.5)

## 2018-08-04 LAB — ABO/RH: ABO/RH(D): O POS

## 2018-08-04 LAB — HEPARIN LEVEL (UNFRACTIONATED): Heparin Unfractionated: 0.64 IU/mL (ref 0.30–0.70)

## 2018-08-04 LAB — POCT ACTIVATED CLOTTING TIME: Activated Clotting Time: 439 seconds

## 2018-08-04 SURGERY — INTRAVASCULAR PRESSURE WIRE/FFR STUDY
Anesthesia: LOCAL

## 2018-08-04 MED ORDER — CHLORHEXIDINE GLUCONATE 4 % EX LIQD
60.0000 mL | Freq: Once | CUTANEOUS | Status: AC
  Administered 2018-08-05: 4 via TOPICAL
  Filled 2018-08-04: qty 60

## 2018-08-04 MED ORDER — SODIUM CHLORIDE 0.9% FLUSH
3.0000 mL | Freq: Two times a day (BID) | INTRAVENOUS | Status: DC
  Administered 2018-08-04: 3 mL via INTRAVENOUS

## 2018-08-04 MED ORDER — ADENOSINE 12 MG/4ML IV SOLN
INTRAVENOUS | Status: AC
  Filled 2018-08-04: qty 4

## 2018-08-04 MED ORDER — HYDRALAZINE HCL 20 MG/ML IJ SOLN: 5.0000 mg | INTRAMUSCULAR | Status: AC | PRN

## 2018-08-04 MED ORDER — SODIUM CHLORIDE 0.9 % IV SOLN
1.5000 g | INTRAVENOUS | Status: AC
  Administered 2018-08-05: 1.5 g via INTRAVENOUS
  Filled 2018-08-04: qty 1.5

## 2018-08-04 MED ORDER — SODIUM CHLORIDE 0.9% FLUSH: 3.0000 mL | INTRAVENOUS | Status: DC | PRN

## 2018-08-04 MED ORDER — LIDOCAINE HCL (PF) 1 % IJ SOLN
INTRAMUSCULAR | Status: DC | PRN
  Administered 2018-08-04: 2 mL

## 2018-08-04 MED ORDER — MAGNESIUM SULFATE 50 % IJ SOLN
40.0000 meq | INTRAMUSCULAR | Status: DC
  Filled 2018-08-04: qty 9.85

## 2018-08-04 MED ORDER — DOPAMINE-DEXTROSE 3.2-5 MG/ML-% IV SOLN
0.0000 ug/kg/min | INTRAVENOUS | Status: DC
  Filled 2018-08-04: qty 250

## 2018-08-04 MED ORDER — PLASMA-LYTE 148 IV SOLN
INTRAVENOUS | Status: DC
  Filled 2018-08-04: qty 2.5

## 2018-08-04 MED ORDER — POTASSIUM CHLORIDE 2 MEQ/ML IV SOLN
80.0000 meq | INTRAVENOUS | Status: DC
  Filled 2018-08-04: qty 40

## 2018-08-04 MED ORDER — BISACODYL 5 MG PO TBEC
5.0000 mg | DELAYED_RELEASE_TABLET | Freq: Once | ORAL | Status: AC
  Administered 2018-08-04: 5 mg via ORAL
  Filled 2018-08-04: qty 1

## 2018-08-04 MED ORDER — VERAPAMIL HCL 2.5 MG/ML IV SOLN
INTRAVENOUS | Status: AC
  Filled 2018-08-04: qty 2

## 2018-08-04 MED ORDER — VANCOMYCIN HCL 10 G IV SOLR
1500.0000 mg | INTRAVENOUS | Status: AC
  Administered 2018-08-05: 1500 mg via INTRAVENOUS
  Filled 2018-08-04: qty 1500

## 2018-08-04 MED ORDER — SODIUM CHLORIDE 0.9 % WEIGHT BASED INFUSION: 1.0000 mL/kg/h | INTRAVENOUS | Status: DC

## 2018-08-04 MED ORDER — VERAPAMIL HCL 2.5 MG/ML IV SOLN
INTRAVENOUS | Status: DC | PRN
  Administered 2018-08-04: 10 mL via INTRA_ARTERIAL

## 2018-08-04 MED ORDER — MIDAZOLAM HCL 2 MG/2ML IJ SOLN
INTRAMUSCULAR | Status: DC | PRN
  Administered 2018-08-04: 1 mg via INTRAVENOUS

## 2018-08-04 MED ORDER — SODIUM CHLORIDE 0.9 % IV SOLN
INTRAVENOUS | Status: DC
  Filled 2018-08-04 (×2): qty 2

## 2018-08-04 MED ORDER — HEPARIN (PORCINE) IN NACL 1000-0.9 UT/500ML-% IV SOLN
INTRAVENOUS | Status: AC
  Filled 2018-08-04: qty 500

## 2018-08-04 MED ORDER — SODIUM CHLORIDE 0.9 % IV SOLN
INTRAVENOUS | Status: DC
  Filled 2018-08-04: qty 30

## 2018-08-04 MED ORDER — EPINEPHRINE PF 1 MG/ML IJ SOLN
0.0000 ug/min | INTRAVENOUS | Status: DC
  Filled 2018-08-04: qty 4

## 2018-08-04 MED ORDER — MILRINONE LACTATE IN DEXTROSE 20-5 MG/100ML-% IV SOLN
0.1250 ug/kg/min | INTRAVENOUS | Status: DC
  Filled 2018-08-04: qty 100

## 2018-08-04 MED ORDER — TEMAZEPAM 15 MG PO CAPS: 15.0000 mg | ORAL_CAPSULE | Freq: Once | ORAL | Status: DC | PRN

## 2018-08-04 MED ORDER — MIDAZOLAM HCL 2 MG/2ML IJ SOLN
INTRAMUSCULAR | Status: AC
  Filled 2018-08-04: qty 2

## 2018-08-04 MED ORDER — DEXMEDETOMIDINE HCL IN NACL 400 MCG/100ML IV SOLN
0.1000 ug/kg/h | INTRAVENOUS | Status: DC
  Filled 2018-08-04: qty 100

## 2018-08-04 MED ORDER — HEPARIN (PORCINE) IN NACL 100-0.45 UNIT/ML-% IJ SOLN
1300.0000 [IU]/h | INTRAMUSCULAR | Status: AC
  Administered 2018-08-04: 1300 [IU]/h via INTRAVENOUS
  Filled 2018-08-04: qty 250

## 2018-08-04 MED ORDER — TRANEXAMIC ACID 1000 MG/10ML IV SOLN
1.5000 mg/kg/h | INTRAVENOUS | Status: AC
  Administered 2018-08-05: 1.5 mg/kg/h via INTRAVENOUS
  Filled 2018-08-04: qty 25

## 2018-08-04 MED ORDER — TRANEXAMIC ACID (OHS) PUMP PRIME SOLUTION
2.0000 mg/kg | INTRAVENOUS | Status: DC
  Filled 2018-08-04: qty 1.71

## 2018-08-04 MED ORDER — LIDOCAINE HCL (PF) 1 % IJ SOLN
INTRAMUSCULAR | Status: AC
  Filled 2018-08-04: qty 30

## 2018-08-04 MED ORDER — FENTANYL CITRATE (PF) 100 MCG/2ML IJ SOLN
INTRAMUSCULAR | Status: DC | PRN
  Administered 2018-08-04: 12.5 ug via INTRAVENOUS

## 2018-08-04 MED ORDER — IOHEXOL 350 MG/ML SOLN
INTRAVENOUS | Status: DC | PRN
  Administered 2018-08-04: 90 mL via INTRA_ARTERIAL

## 2018-08-04 MED ORDER — SODIUM CHLORIDE 0.9 % IV SOLN
750.0000 mg | INTRAVENOUS | Status: AC
  Administered 2018-08-05: 750 mg via INTRAVENOUS
  Filled 2018-08-04: qty 750

## 2018-08-04 MED ORDER — TRANEXAMIC ACID (OHS) BOLUS VIA INFUSION
15.0000 mg/kg | INTRAVENOUS | Status: AC
  Administered 2018-08-05: 1281 mg via INTRAVENOUS
  Filled 2018-08-04: qty 1281

## 2018-08-04 MED ORDER — HEPARIN SODIUM (PORCINE) 1000 UNIT/ML IJ SOLN
INTRAMUSCULAR | Status: DC | PRN
  Administered 2018-08-04: 10000 [IU] via INTRAVENOUS

## 2018-08-04 MED ORDER — NITROGLYCERIN IN D5W 200-5 MCG/ML-% IV SOLN
2.0000 ug/min | INTRAVENOUS | Status: AC
  Administered 2018-08-05: 5 ug/min via INTRAVENOUS
  Filled 2018-08-04: qty 250

## 2018-08-04 MED ORDER — SODIUM CHLORIDE 0.9 % IV SOLN: 250.0000 mL | INTRAVENOUS | Status: DC | PRN

## 2018-08-04 MED ORDER — METOPROLOL TARTRATE 12.5 MG HALF TABLET
12.5000 mg | ORAL_TABLET | Freq: Once | ORAL | Status: AC
  Administered 2018-08-05: 12.5 mg via ORAL
  Filled 2018-08-04: qty 1

## 2018-08-04 MED ORDER — SODIUM CHLORIDE 0.9 % IV SOLN
INTRAVENOUS | Status: DC
  Filled 2018-08-04: qty 1

## 2018-08-04 MED ORDER — FENTANYL CITRATE (PF) 100 MCG/2ML IJ SOLN
INTRAMUSCULAR | Status: AC
  Filled 2018-08-04: qty 2

## 2018-08-04 MED ORDER — VANCOMYCIN HCL 1000 MG IV SOLR
INTRAVENOUS | Status: DC
  Filled 2018-08-04: qty 1000

## 2018-08-04 MED ORDER — CHLORHEXIDINE GLUCONATE 4 % EX LIQD
60.0000 mL | Freq: Once | CUTANEOUS | Status: AC
  Administered 2018-08-04: 4 via TOPICAL
  Filled 2018-08-04: qty 60

## 2018-08-04 MED ORDER — CHLORHEXIDINE GLUCONATE 0.12 % MT SOLN
15.0000 mL | Freq: Once | OROMUCOSAL | Status: AC
  Administered 2018-08-05: 15 mL via OROMUCOSAL
  Filled 2018-08-04: qty 15

## 2018-08-04 MED ORDER — ADENOSINE (DIAGNOSTIC) 140MCG/KG/MIN
INTRAVENOUS | Status: DC | PRN
  Administered 2018-08-04: 140 ug/kg/min via INTRAVENOUS

## 2018-08-04 MED ORDER — HEPARIN (PORCINE) IN NACL 1000-0.9 UT/500ML-% IV SOLN
INTRAVENOUS | Status: DC | PRN
  Administered 2018-08-04 (×2): 500 mL

## 2018-08-04 MED ORDER — SODIUM CHLORIDE 0.9 % IV SOLN: INTRAVENOUS | Status: AC

## 2018-08-04 MED ORDER — LABETALOL HCL 5 MG/ML IV SOLN: 10.0000 mg | INTRAVENOUS | Status: AC | PRN

## 2018-08-04 MED ORDER — NITROGLYCERIN 1 MG/10 ML FOR IR/CATH LAB
INTRA_ARTERIAL | Status: AC
  Filled 2018-08-04: qty 10

## 2018-08-04 MED ORDER — SODIUM CHLORIDE 0.9% FLUSH: 3.0000 mL | Freq: Two times a day (BID) | INTRAVENOUS | Status: DC

## 2018-08-04 MED ORDER — HEPARIN SODIUM (PORCINE) 1000 UNIT/ML IJ SOLN
INTRAMUSCULAR | Status: AC
  Filled 2018-08-04: qty 1

## 2018-08-04 MED ORDER — SODIUM CHLORIDE 0.9 % WEIGHT BASED INFUSION
3.0000 mL/kg/h | INTRAVENOUS | Status: DC
  Administered 2018-08-04: 3 mL/kg/h via INTRAVENOUS

## 2018-08-04 SURGICAL SUPPLY — 14 items
CATH MICROCATH NAVVUS (MICROCATHETER) IMPLANT
CATH VISTA GUIDE 6FR XBLAD3.5 (CATHETERS) ×1 IMPLANT
DEVICE RAD COMP TR BAND LRG (VASCULAR PRODUCTS) ×1 IMPLANT
GLIDESHEATH SLEND SS 6F .021 (SHEATH) ×1 IMPLANT
GUIDEWIRE INQWIRE 1.5J.035X260 (WIRE) IMPLANT
INQWIRE 1.5J .035X260CM (WIRE) ×2
KIT ESSENTIALS PG (KITS) ×2 IMPLANT
KIT HEART LEFT (KITS) ×2 IMPLANT
MICROCATHETER NAVVUS (MICROCATHETER) ×2
PACK CARDIAC CATHETERIZATION (CUSTOM PROCEDURE TRAY) ×2 IMPLANT
SHEATH PROBE COVER 6X72 (BAG) ×1 IMPLANT
TRANSDUCER W/STOPCOCK (MISCELLANEOUS) ×2 IMPLANT
WIRE COUGAR XT STRL 190CM (WIRE) ×2 IMPLANT
WIRE HI TORQ WHISPER MS 190CM (WIRE) ×1 IMPLANT

## 2018-08-04 NOTE — H&P (View-Only) (Signed)
Progress Note  Patient Name: Timothy Simpson Date of Encounter: 08/04/2018  Primary Cardiologist: Glenetta Hew, MD   Subjective   No further chest pain or shortness of breath.  He remains on IV heparin.  Scheduled for cardiac catheterization/FFR of left main today.  Inpatient Medications    Scheduled Meds: . allopurinol  100 mg Oral Daily  . aspirin EC  81 mg Oral Daily  . atorvastatin  80 mg Oral q1800  . carvedilol  6.25 mg Oral BID WC  . irbesartan  150 mg Oral Daily  . sodium chloride flush  3 mL Intravenous Q12H  . sodium chloride flush  3 mL Intravenous Q12H   Continuous Infusions: . sodium chloride    . sodium chloride    . sodium chloride Stopped (08/04/18 1058)   Followed by  . sodium chloride    . heparin 1,300 Units/hr (08/04/18 0627)   PRN Meds: sodium chloride, sodium chloride, acetaminophen, nitroGLYCERIN, ondansetron (ZOFRAN) IV, sodium chloride flush, sodium chloride flush   Vital Signs    Vitals:   08/03/18 1131 08/03/18 1942 08/04/18 0520 08/04/18 0908  BP: 106/69 99/71 96/65  101/60  Pulse: 63 64 66 61  Resp: 18 18 18 20   Temp: 98.6 F (37 C) 98.4 F (36.9 C) 98.2 F (36.8 C) 98.6 F (37 C)  TempSrc: Oral Oral Oral Oral  SpO2: 99% 98% 96% 97%  Weight:   85.4 kg   Height:        Intake/Output Summary (Last 24 hours) at 08/04/2018 1116 Last data filed at 08/04/2018 0600 Gross per 24 hour  Intake 765.02 ml  Output 700 ml  Net 65.02 ml   Filed Weights   08/02/18 0431 08/03/18 0438 08/04/18 0520  Weight: 86.7 kg 86.1 kg 85.4 kg    Telemetry    Sinus rhythm- Personally Reviewed  ECG    None performed today- Personally Reviewed  Physical Exam   GEN: No acute distress.   Neck: No JVD Cardiac: RRR, no murmurs, rubs, or gallops.  Respiratory: Clear to auscultation bilaterally. GI: Soft, nontender, non-distended  MS: No edema; No deformity. Neuro:  Nonfocal  Psych: Normal affect   Labs    Chemistry Recent Labs  Lab  07/28/18 2210 08/01/18 0523  NA 141 140  K 3.9 3.9  CL 102 103  CO2 31 28  GLUCOSE 124* 98  BUN 8 12  CREATININE 1.15 0.99  CALCIUM 9.4 9.3  PROT  --  6.4*  ALBUMIN  --  3.5  AST  --  22  ALT  --  13  ALKPHOS  --  73  BILITOT  --  0.9  GFRNONAA >60 >60  GFRAA >60 >60  ANIONGAP 8 9     Hematology Recent Labs  Lab 08/02/18 0609 08/03/18 0422 08/04/18 0611  WBC 7.2 6.5 5.5  RBC 4.84 4.74 4.86  HGB 13.7 13.7 14.0  HCT 42.6 41.7 43.0  MCV 88.0 88.0 88.5  MCH 28.3 28.9 28.8  MCHC 32.2 32.9 32.6  RDW 13.1 13.2 12.9  PLT 222 226 219    Cardiac Enzymes Recent Labs  Lab 07/29/18 0034 07/29/18 0511 07/29/18 1152 07/29/18 2208  TROPONINI 0.29* 1.28* 3.65* 2.75*   No results for input(s): TROPIPOC in the last 168 hours.   BNPNo results for input(s): BNP, PROBNP in the last 168 hours.   DDimer No results for input(s): DDIMER in the last 168 hours.   Radiology    No results found.  Cardiac Studies  2D echocardiogram (07/21/2018)  Study Conclusions  - Left ventricle: The cavity size was normal. There was mild   concentric hypertrophy. Systolic function was normal. The   estimated ejection fraction was in the range of 60% to 65%. Wall   motion was normal; there were no regional wall motion   abnormalities. There was an increased relative contribution of   atrial contraction to ventricular filling. Doppler parameters are   consistent with abnormal left ventricular relaxation (grade 1   diastolic dysfunction). - Mitral valve: There was mild regurgitation. - Atrial septum: There was increased thickness of the septum,   consistent with lipomatous hypertrophy. - Tricuspid valve: There was mild regurgitation.  Cardiac catheterization (07/30/2018)   Mid LM to Dist LM lesion is 50% stenosed. 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  Ost Ramus lesion is 95% stenosed.  Lat Ramus lesion is 99% stenosed.  Dist RCA lesion is 100% stenosed.  Mid  Cx lesion is 70% stenosed.  Prox LAD lesion is 60% stenosed.  The left ventricular systolic function is normal.  LV end diastolic pressure is normal. LVEDP 6 mm Hg.  The left ventricular ejection fraction is 55-65% by visual estimate.  There is no aortic valve stenosis.   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    Patient Profile     72 y.o. male admitted with non-STEMI.  He has a history of hypertension and hyperlipidemia as well as tobacco abuse.  He underwent cardiac catheterization by Dr. Irish Lack on 07/30/2018 revealing moderately severe distal left main disease, moderate LAD disease and high-grade ramus branch disease at the origin with a total RCA and left right collaterals.  EF is preserved.  Plans were to perform FFR of his left main today to decide regarding bypass surgery versus staged PCI.  Assessment & Plan    1: Coronary artery disease- history of CAD by cath on 07/30/2018 in the setting of non-STEMI with moderate left main disease, high-grade ostial ramus branch disease and total RCA with left-to-right collaterals.  He said no recurrent chest pain on IV heparin.  Plans were for FFR of left main today to decide regarding CABG planned for tomorrow versus staged PCI.  2: Essential hypertension- controlled on carvedilol and irbesartan  3: Hyperlipidemia- on high-dose statin therapy.      For questions or updates, please contact [place] Please consult [email or web address] for contact info under Cardiology/STEMI.      Signed, Quay Burow, MD  08/04/2018, 11:16 AM

## 2018-08-04 NOTE — Progress Notes (Addendum)
Progress Note  Patient Name: Timothy Simpson Date of Encounter: 08/04/2018  Primary Cardiologist: Glenetta Hew, MD  Subjective   Feeling well this morning. No chest pain. Planned for cath today with FFR of the LM.   Inpatient Medications    Scheduled Meds: . allopurinol  100 mg Oral Daily  . aspirin EC  81 mg Oral Daily  . atorvastatin  80 mg Oral q1800  . carvedilol  6.25 mg Oral BID WC  . irbesartan  150 mg Oral Daily  . sodium chloride flush  3 mL Intravenous Q12H   Continuous Infusions: . sodium chloride    . heparin 1,300 Units/hr (08/04/18 0627)   PRN Meds: sodium chloride, acetaminophen, nitroGLYCERIN, ondansetron (ZOFRAN) IV, sodium chloride flush   Vital Signs    Vitals:   08/03/18 1131 08/03/18 1942 08/04/18 0520 08/04/18 0908  BP: 106/69 99/71 96/65  101/60  Pulse: 63 64 66 61  Resp: 18 18 18 20   Temp: 98.6 F (37 C) 98.4 F (36.9 C) 98.2 F (36.8 C) 98.6 F (37 C)  TempSrc: Oral Oral Oral Oral  SpO2: 99% 98% 96% 97%  Weight:   85.4 kg   Height:        Intake/Output Summary (Last 24 hours) at 08/04/2018 1043 Last data filed at 08/04/2018 0600 Gross per 24 hour  Intake 765.02 ml  Output 700 ml  Net 65.02 ml   Filed Weights   08/02/18 0431 08/03/18 0438 08/04/18 0520  Weight: 86.7 kg 86.1 kg 85.4 kg    Telemetry    SR - Personally Reviewed  Physical Exam   General: Well developed, well nourished, male appearing in no acute distress. Head: Normocephalic, atraumatic.  Neck: Supple without bruits, JVD. Lungs:  Resp regular and unlabored, CTA. Heart: RRR, S1, S2, no murmur; no rub. Abdomen: Soft, non-tender, non-distended with normoactive bowel sounds. Extremities: No clubbing, cyanosis, edema. Distal pedal pulses are 2+ bilaterally. Neuro: Alert and oriented X 3. Moves all extremities spontaneously. Psych: Normal affect.  Labs    Chemistry Recent Labs  Lab 07/28/18 2210 08/01/18 0523  NA 141 140  K 3.9 3.9  CL 102 103  CO2  31 28  GLUCOSE 124* 98  BUN 8 12  CREATININE 1.15 0.99  CALCIUM 9.4 9.3  PROT  --  6.4*  ALBUMIN  --  3.5  AST  --  22  ALT  --  13  ALKPHOS  --  73  BILITOT  --  0.9  GFRNONAA >60 >60  GFRAA >60 >60  ANIONGAP 8 9     Hematology Recent Labs  Lab 08/02/18 0609 08/03/18 0422 08/04/18 0611  WBC 7.2 6.5 5.5  RBC 4.84 4.74 4.86  HGB 13.7 13.7 14.0  HCT 42.6 41.7 43.0  MCV 88.0 88.0 88.5  MCH 28.3 28.9 28.8  MCHC 32.2 32.9 32.6  RDW 13.1 13.2 12.9  PLT 222 226 219    Cardiac Enzymes Recent Labs  Lab 07/29/18 0034 07/29/18 0511 07/29/18 1152 07/29/18 2208  TROPONINI 0.29* 1.28* 3.65* 2.75*   No results for input(s): TROPIPOC in the last 168 hours.   BNPNo results for input(s): BNP, PROBNP in the last 168 hours.   DDimer No results for input(s): DDIMER in the last 168 hours.    Radiology    No results found.  Cardiac Studies   Cath: 07/30/18   Mid LM to Dist LM lesion is 50% stenosed. 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  Ost Ramus lesion  is 95% stenosed.  Lat Ramus lesion is 99% stenosed.  Dist RCA lesion is 100% stenosed.  Mid Cx lesion is 70% stenosed.  Prox LAD lesion is 60% stenosed.  The left ventricular systolic function is normal.  LV end diastolic pressure is normal. LVEDP 6 mm Hg.  The left ventricular ejection fraction is 55-65% by visual estimate.  There is no aortic valve stenosis.   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   TTE: 07/29/18  Study Conclusions  - Left ventricle: The cavity size was normal. There was mild   concentric hypertrophy. Systolic function was normal. The   estimated ejection fraction was in the range of 60% to 65%. Wall   motion was normal; there were no regional wall  motion   abnormalities. There was an increased relative contribution of   atrial contraction to ventricular filling. Doppler parameters are   consistent with abnormal left ventricular relaxation (grade 1   diastolic dysfunction). - Mitral valve: There was mild regurgitation. - Atrial septum: There was increased thickness of the septum,   consistent with lipomatous hypertrophy. - Tricuspid valve: There was mild regurgitation.  Patient Profile     72 y.o. male admitted with NSTEMI. Underwent cath noted above with multivessel disease. Consulted TCTS, with planned for cath to evaluate LM lesion today.   Assessment & Plan    1. NSTEMI:Trop peaked at 3.65. Multivessel disease noted. Planned for FFR of the LM to determine significance. If abnormal then appropriate of CABG. Normal EF.   2: HTN: stable on coreg, and irbesartan  3. HL: LD 109, now on high dose statin.   4. Tobacco use: cessation advised.    Signed, Reino Bellis, NP  08/04/2018, 10:43 AM  Pager # [phone]    Agree with note by Reino Bellis NP-C  72 year old admitted with non-STEMI.  He underwent cardiac catheterization by Dr. Irish Lack revealing a 70% hypodense distal left main, high-grade ostial/proximal ramus branch which was probably his "culprit lesion and an occluded distal RCA filling by left-to-right collaterals with preserved LV function.  He was referred for CABG however it was decided to perform left main/LAD FFR to determine physiologic significance prior to proceeding with scheduled CABG tomorrow.   Lorretta Harp, M.D., [place], [facility], [place] [address] [place], [place]  [address]  [phone] 08/04/2018 1:18 PM  For questions or updates, please contact [place] Please consult [email or web address] for contact info under Cardiology/STEMI.

## 2018-08-04 NOTE — Interval H&P Note (Signed)
History and Physical Interval Note:  08/04/2018 12:40 PM  Timothy Simpson  has presented today for cardiac cath with the diagnosis of CAD with NSTEMI  The various methods of treatment have been discussed with the patient and family. After consideration of risks, benefits and other options for treatment, the patient has consented to  Procedure(s): INTRAVASCULAR PRESSURE WIRE/FFR STUDY (N/A) as a surgical intervention .  The patient's history has been reviewed, patient examined, no change in status, stable for surgery.  I have reviewed the patient's chart and labs.  Questions were answered to the patient's satisfaction.    Cath Lab Visit (complete for each Cath Lab visit)  Clinical Evaluation Leading to the Procedure:   ACS: Yes.    Non-ACS:    Anginal Classification: CCS III  Anti-ischemic medical therapy: No Therapy  Non-Invasive Test Results: No non-invasive testing performed  Prior CABG: No previous CABG         Lauree Chandler

## 2018-08-04 NOTE — Progress Notes (Signed)
Patient down to cath lab, heparin stopped.

## 2018-08-04 NOTE — Progress Notes (Signed)
Patient back from cath lab, TR band placed 1355, orders to remove air in 2hrs. Patient does not endorse pain, VSS. Patient and family updated on plan of care.

## 2018-08-04 NOTE — Plan of Care (Signed)
  Problem: Education: Goal: Knowledge of General Education information will improve Description: Including pain rating scale, medication(s)/side effects and non-pharmacologic comfort measures Outcome: Progressing   Problem: Clinical Measurements: Goal: Cardiovascular complication will be avoided Outcome: Progressing   Problem: Coping: Goal: Level of anxiety will decrease Outcome: Progressing   

## 2018-08-04 NOTE — Progress Notes (Signed)
ANTICOAGULATION CONSULT NOTE - Follow Up Consult  Pharmacy Consult for Heparin Indication: chest pain/ACS, s/p cath, likely CABG  No Known Allergies  Patient Measurements: Height: 6\' 1"  (185.4 cm) Weight: 188 lb 3.2 oz (85.4 kg) IBW/kg (Calculated) : 79.9   Vital Signs: Temp: 97.7 F (36.5 C) (09/03 1421) Temp Source: Oral (09/03 1421) BP: 107/67 (09/03 1421) Pulse Rate: 63 (09/03 1421)  Labs: Recent Labs    08/02/18 0609 08/03/18 0422 08/04/18 0611  HGB 13.7 13.7 14.0  HCT 42.6 41.7 43.0  PLT 222 226 219  HEPARINUNFRC 0.57 0.40 0.64    Estimated Creatinine Clearance: 79.6 mL/min (by C-G formula based on SCr of 0.99 mg/dL).  Assessment: 72 year old male s/p cath, with severe stenosis in the distal left main. Plan for CABG tomorrow. Pharmacy is consulted to resume heparin infusion 8 hrs after sheath removal (1400). Previously therapeutic on 1300 units/hr  Goal of Therapy:  Heparin level 0.3-0.7 units/ml Monitor platelets by anticoagulation protocol: Yes   Plan:  Restart heparin drip 1300 units/hr with no bolus at 2200 Daily heparin level and CBC Will follow plans post CABG  Maryanna Shape, PharmD, BCPS, BCPPS Clinical Pharmacist  Pager: [phone]

## 2018-08-04 NOTE — Progress Notes (Signed)
      [address]       [place] [address]             [phone]        CARDIOTHORACIC SURGERY PROGRESS NOTE   RDay of Surgery Procedure(s) (LRB): INTRAVASCULAR PRESSURE WIRE/FFR STUDY (N/A)  Subjective: No complaints.   No chest pain or SOB  Objective: Vital signs: BP Readings from Last 1 Encounters:  08/04/18 114/66   Pulse Readings from Last 1 Encounters:  08/04/18 65   Resp Readings from Last 1 Encounters:  08/04/18 20   Temp Readings from Last 1 Encounters:  08/04/18 97.7 F (36.5 C) (Oral)    Hemodynamics:    Physical Exam:  Rhythm:   sinus  Breath sounds: clear  Heart sounds:  RRR  Incisions:  n/a  Abdomen:  soft  Extremities:  warm   Intake/Output from previous day: 09/02 0701 - 09/03 0700 In: 1005 [P.O.:680; I.V.:325] Out: 800 [Urine:800] Intake/Output this shift: Total I/O In: 133.7 [P.O.:50; I.V.:83.7] Out: 300 [Urine:300]  Lab Results:  CBC: Recent Labs    08/03/18 0422 08/04/18 0611  WBC 6.5 5.5  HGB 13.7 14.0  HCT 41.7 43.0  PLT 226 219    BMET: No results for input(s): NA, K, CL, CO2, GLUCOSE, BUN, CREATININE, CALCIUM in the last 72 hours.   PT/INR:  No results for input(s): LABPROT, INR in the last 72 hours.  CBG (last 3)  No results for input(s): GLUCAP in the last 72 hours.  ABG    Component Value Date/Time   PHART 7.423 08/01/2018 0512   PCO2ART 43.2 08/01/2018 0512   PO2ART 69.9 (L) 08/01/2018 0512   HCO3 27.7 08/01/2018 0512   O2SAT 93.6 08/01/2018 0512    CXR: CHEST - 2 VIEW  COMPARISON:  07/28/2018  FINDINGS: Cardiac shadow is stable. The lungs are well aerated bilaterally. No focal infiltrate or sizable effusion is seen. Degenerative changes of the thoracic spine are noted.  IMPRESSION: No acute abnormality noted.  No change from the prior study.   Electronically Signed   By: Inez Catalina M.D.   On: 07/31/2018 12:17   Assessment/Plan: S/P Procedure(s)  (LRB): INTRAVASCULAR PRESSURE WIRE/FFR STUDY (N/A)  Results of FFR study noted.  We plan CABG tomorrow.  I have again discussed the indications, risks and potential benefits of CABG with Timothy Simpson.  Expectations for his postoperative recovery were discussed.  All questions answered.  I spent in excess of 15 minutes during the conduct of this hospital encounter and >50% of this time involved direct face-to-face encounter with the patient for counseling and/or coordination of their care.    Timothy Alberts, MD 08/04/2018 4:59 PM

## 2018-08-04 NOTE — Progress Notes (Signed)
Progress Note  Patient Name: Timothy Simpson [facility] Date of Encounter: 08/04/2018  Primary Cardiologist: Glenetta Hew, MD   Subjective   No further chest pain or shortness of breath.  He remains on IV heparin.  Scheduled for cardiac catheterization/FFR of left main today.  Inpatient Medications    Scheduled Meds: . allopurinol  100 mg Oral Daily  . aspirin EC  81 mg Oral Daily  . atorvastatin  80 mg Oral q1800  . carvedilol  6.25 mg Oral BID WC  . irbesartan  150 mg Oral Daily  . sodium chloride flush  3 mL Intravenous Q12H  . sodium chloride flush  3 mL Intravenous Q12H   Continuous Infusions: . sodium chloride    . sodium chloride    . sodium chloride Stopped (08/04/18 1058)   Followed by  . sodium chloride    . heparin 1,300 Units/hr (08/04/18 0627)   PRN Meds: sodium chloride, sodium chloride, acetaminophen, nitroGLYCERIN, ondansetron (ZOFRAN) IV, sodium chloride flush, sodium chloride flush   Vital Signs    Vitals:   08/03/18 1131 08/03/18 1942 08/04/18 0520 08/04/18 0908  BP: 106/69 99/71 96/65  101/60  Pulse: 63 64 66 61  Resp: 18 18 18 20   Temp: 98.6 F (37 C) 98.4 F (36.9 C) 98.2 F (36.8 C) 98.6 F (37 C)  TempSrc: Oral Oral Oral Oral  SpO2: 99% 98% 96% 97%  Weight:   85.4 kg   Height:        Intake/Output Summary (Last 24 hours) at 08/04/2018 1116 Last data filed at 08/04/2018 0600 Gross per 24 hour  Intake 765.02 ml  Output 700 ml  Net 65.02 ml   Filed Weights   08/02/18 0431 08/03/18 0438 08/04/18 0520  Weight: 86.7 kg 86.1 kg 85.4 kg    Telemetry    Sinus rhythm- Personally Reviewed  ECG    None performed today- Personally Reviewed  Physical Exam   GEN: No acute distress.   Neck: No JVD Cardiac: RRR, no murmurs, rubs, or gallops.  Respiratory: Clear to auscultation bilaterally. GI: Soft, nontender, non-distended  MS: No edema; No deformity. Neuro:  Nonfocal  Psych: Normal affect   Labs    Chemistry Recent Labs  Lab  07/28/18 2210 08/01/18 0523  NA 141 140  K 3.9 3.9  CL 102 103  CO2 31 28  GLUCOSE 124* 98  BUN 8 12  CREATININE 1.15 0.99  CALCIUM 9.4 9.3  PROT  --  6.4*  ALBUMIN  --  3.5  AST  --  22  ALT  --  13  ALKPHOS  --  73  BILITOT  --  0.9  GFRNONAA >60 >60  GFRAA >60 >60  ANIONGAP 8 9     Hematology Recent Labs  Lab 08/02/18 0609 08/03/18 0422 08/04/18 0611  WBC 7.2 6.5 5.5  RBC 4.84 4.74 4.86  HGB 13.7 13.7 14.0  HCT 42.6 41.7 43.0  MCV 88.0 88.0 88.5  MCH 28.3 28.9 28.8  MCHC 32.2 32.9 32.6  RDW 13.1 13.2 12.9  PLT 222 226 219    Cardiac Enzymes Recent Labs  Lab 07/29/18 0034 07/29/18 0511 07/29/18 1152 07/29/18 2208  TROPONINI 0.29* 1.28* 3.65* 2.75*   No results for input(s): TROPIPOC in the last 168 hours.   BNPNo results for input(s): BNP, PROBNP in the last 168 hours.   DDimer No results for input(s): DDIMER in the last 168 hours.   Radiology    No results found.  Cardiac Studies  2D echocardiogram (07/21/2018)  Study Conclusions  - Left ventricle: The cavity size was normal. There was mild   concentric hypertrophy. Systolic function was normal. The   estimated ejection fraction was in the range of 60% to 65%. Wall   motion was normal; there were no regional wall motion   abnormalities. There was an increased relative contribution of   atrial contraction to ventricular filling. Doppler parameters are   consistent with abnormal left ventricular relaxation (grade 1   diastolic dysfunction). - Mitral valve: There was mild regurgitation. - Atrial septum: There was increased thickness of the septum,   consistent with lipomatous hypertrophy. - Tricuspid valve: There was mild regurgitation.  Cardiac catheterization (07/30/2018)   Mid LM to Dist LM lesion is 50% stenosed. 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  Ost Ramus lesion is 95% stenosed.  Lat Ramus lesion is 99% stenosed.  Dist RCA lesion is 100% stenosed.  Mid  Cx lesion is 70% stenosed.  Prox LAD lesion is 60% stenosed.  The left ventricular systolic function is normal.  LV end diastolic pressure is normal. LVEDP 6 mm Hg.  The left ventricular ejection fraction is 55-65% by visual estimate.  There is no aortic valve stenosis.   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    Patient Profile     72 y.o. male admitted with non-STEMI.  He has a history of hypertension and hyperlipidemia as well as tobacco abuse.  He underwent cardiac catheterization by Dr. Irish Lack on 07/30/2018 revealing moderately severe distal left main disease, moderate LAD disease and high-grade ramus branch disease at the origin with a total RCA and left right collaterals.  EF is preserved.  Plans were to perform FFR of his left main today to decide regarding bypass surgery versus staged PCI.  Assessment & Plan    1: Coronary artery disease- history of CAD by cath on 07/30/2018 in the setting of non-STEMI with moderate left main disease, high-grade ostial ramus branch disease and total RCA with left-to-right collaterals.  He said no recurrent chest pain on IV heparin.  Plans were for FFR of left main today to decide regarding CABG planned for tomorrow versus staged PCI.  2: Essential hypertension- controlled on carvedilol and irbesartan  3: Hyperlipidemia- on high-dose statin therapy.      For questions or updates, please contact [place] Please consult [email or web address] for contact info under Cardiology/STEMI.      Signed, Quay Burow, MD  08/04/2018, 11:16 AM

## 2018-08-04 NOTE — Progress Notes (Signed)
ANTICOAGULATION CONSULT NOTE - Follow Up Consult  Pharmacy Consult for Heparin Indication: chest pain/ACS, s/p cath, likely CABG  No Known Allergies  Patient Measurements: Height: 6\' 1"  (185.4 cm) Weight: 188 lb 3.2 oz (85.4 kg) IBW/kg (Calculated) : 79.9   Vital Signs: Temp: 98.6 F (37 C) (09/03 0908) Temp Source: Oral (09/03 0908) BP: 101/60 (09/03 0908) Pulse Rate: 61 (09/03 0908)  Labs: Recent Labs    08/02/18 0609 08/03/18 0422 08/04/18 0611  HGB 13.7 13.7 14.0  HCT 42.6 41.7 43.0  PLT 222 226 219  HEPARINUNFRC 0.57 0.40 0.64    Estimated Creatinine Clearance: 79.6 mL/min (by C-G formula based on SCr of 0.99 mg/dL).  Assessment: 72 year old male continues on heparin for chest pain and cardiac workup. Trop peak at 3.65. No anticoag PTA.  Underwent cath on 8/29 finding multivessel dx and L main aneurysmal area. Plan is for FFR today to determine procedural plans.  -heparin level= 0.64  Goal of Therapy:  Heparin level 0.3-0.7 units/ml Monitor platelets by anticoagulation protocol: Yes   Plan:  Continue heparin drip at 1300 units/hr Daily heparin level and CBC Will follow plans post procedure  Hildred Laser, PharmD Clinical Pharmacist Please check Amion for pharmacy contact number

## 2018-08-05 ENCOUNTER — Inpatient Hospital Stay (HOSPITAL_COMMUNITY): Payer: Medicare HMO

## 2018-08-05 ENCOUNTER — Inpatient Hospital Stay (HOSPITAL_COMMUNITY): Payer: Medicare HMO | Admitting: Anesthesiology

## 2018-08-05 ENCOUNTER — Encounter (HOSPITAL_COMMUNITY): Payer: Self-pay | Admitting: Cardiovascular Disease

## 2018-08-05 ENCOUNTER — Encounter (HOSPITAL_COMMUNITY)
Admission: EM | Disposition: A | Discharge status: Home / Self Care | Payer: Self-pay | Source: Home / Self Care | Attending: Internal Medicine

## 2018-08-05 ENCOUNTER — Other Ambulatory Visit: Payer: Self-pay

## 2018-08-05 DIAGNOSIS — Z951 Presence of aortocoronary bypass graft: Secondary | ICD-10-CM

## 2018-08-05 DIAGNOSIS — I2510 Atherosclerotic heart disease of native coronary artery without angina pectoris: Secondary | ICD-10-CM

## 2018-08-05 HISTORY — PX: CORONARY ARTERY BYPASS GRAFT: SHX141

## 2018-08-05 HISTORY — PX: TEE WITHOUT CARDIOVERSION: SHX5443

## 2018-08-05 HISTORY — DX: Presence of aortocoronary bypass graft: Z95.1

## 2018-08-05 LAB — POCT I-STAT, CHEM 8
BUN: 17 mg/dL (ref 8–23)
BUN: 16 mg/dL (ref 8–23)
BUN: 14 mg/dL (ref 8–23)
BUN: 15 mg/dL (ref 8–23)
BUN: 14 mg/dL (ref 8–23)
BUN: 13 mg/dL (ref 8–23)
CALCIUM ION: 1.24 mmol/L (ref 1.15–1.40)
CALCIUM ION: 1.06 mmol/L — AB (ref 1.15–1.40)
CALCIUM ION: 1.08 mmol/L — AB (ref 1.15–1.40)
CALCIUM ION: 1.06 mmol/L — AB (ref 1.15–1.40)
CALCIUM ION: 1.18 mmol/L (ref 1.15–1.40)
CREATININE: 0.7 mg/dL (ref 0.61–1.24)
CREATININE: 0.8 mg/dL (ref 0.61–1.24)
Calcium, Ion: 1.25 mmol/L (ref 1.15–1.40)
Chloride: 103 mmol/L (ref 98–111)
Chloride: 103 mmol/L (ref 98–111)
Chloride: 101 mmol/L (ref 98–111)
Chloride: 102 mmol/L (ref 98–111)
Chloride: 102 mmol/L (ref 98–111)
Chloride: 107 mmol/L (ref 98–111)
Creatinine, Ser: 0.7 mg/dL (ref 0.61–1.24)
Creatinine, Ser: 0.7 mg/dL (ref 0.61–1.24)
Creatinine, Ser: 0.5 mg/dL — ABNORMAL LOW (ref 0.61–1.24)
Creatinine, Ser: 0.8 mg/dL (ref 0.61–1.24)
GLUCOSE: 114 mg/dL — AB (ref 70–99)
GLUCOSE: 107 mg/dL — AB (ref 70–99)
GLUCOSE: 114 mg/dL — AB (ref 70–99)
GLUCOSE: 102 mg/dL — AB (ref 70–99)
Glucose, Bld: 98 mg/dL (ref 70–99)
Glucose, Bld: 101 mg/dL — ABNORMAL HIGH (ref 70–99)
HCT: 37 % — ABNORMAL LOW (ref 39.0–52.0)
HCT: 26 % — ABNORMAL LOW (ref 39.0–52.0)
HCT: 26 % — ABNORMAL LOW (ref 39.0–52.0)
HCT: 21 % — ABNORMAL LOW (ref 39.0–52.0)
HEMATOCRIT: 34 % — AB (ref 39.0–52.0)
HEMATOCRIT: 26 % — AB (ref 39.0–52.0)
HEMOGLOBIN: 11.6 g/dL — AB (ref 13.0–17.0)
HEMOGLOBIN: 8.8 g/dL — AB (ref 13.0–17.0)
HEMOGLOBIN: 7.1 g/dL — AB (ref 13.0–17.0)
Hemoglobin: 12.6 g/dL — ABNORMAL LOW (ref 13.0–17.0)
Hemoglobin: 8.8 g/dL — ABNORMAL LOW (ref 13.0–17.0)
Hemoglobin: 8.8 g/dL — ABNORMAL LOW (ref 13.0–17.0)
POTASSIUM: 4.6 mmol/L (ref 3.5–5.1)
Potassium: 3.7 mmol/L (ref 3.5–5.1)
Potassium: 3.8 mmol/L (ref 3.5–5.1)
Potassium: 5.4 mmol/L — ABNORMAL HIGH (ref 3.5–5.1)
Potassium: 4.3 mmol/L (ref 3.5–5.1)
Potassium: 4.2 mmol/L (ref 3.5–5.1)
SODIUM: 138 mmol/L (ref 135–145)
Sodium: 137 mmol/L (ref 135–145)
Sodium: 137 mmol/L (ref 135–145)
Sodium: 138 mmol/L (ref 135–145)
Sodium: 138 mmol/L (ref 135–145)
Sodium: 140 mmol/L (ref 135–145)
TCO2: 26 mmol/L (ref 22–32)
TCO2: 24 mmol/L (ref 22–32)
TCO2: 23 mmol/L (ref 22–32)
TCO2: 25 mmol/L (ref 22–32)
TCO2: 25 mmol/L (ref 22–32)
TCO2: 21 mmol/L — ABNORMAL LOW (ref 22–32)

## 2018-08-05 LAB — GLUCOSE, CAPILLARY
GLUCOSE-CAPILLARY: 128 mg/dL — AB (ref 70–99)
GLUCOSE-CAPILLARY: 112 mg/dL — AB (ref 70–99)
GLUCOSE-CAPILLARY: 127 mg/dL — AB (ref 70–99)
GLUCOSE-CAPILLARY: 117 mg/dL — AB (ref 70–99)
Glucose-Capillary: 127 mg/dL — ABNORMAL HIGH (ref 70–99)
Glucose-Capillary: 124 mg/dL — ABNORMAL HIGH (ref 70–99)
Glucose-Capillary: 117 mg/dL — ABNORMAL HIGH (ref 70–99)
Glucose-Capillary: 118 mg/dL — ABNORMAL HIGH (ref 70–99)
Glucose-Capillary: 98 mg/dL (ref 70–99)
Glucose-Capillary: 103 mg/dL — ABNORMAL HIGH (ref 70–99)

## 2018-08-05 LAB — POCT I-STAT 3, ART BLOOD GAS (G3+)
Acid-base deficit: 1 mmol/L (ref 0.0–2.0)
Acid-base deficit: 3 mmol/L — ABNORMAL HIGH (ref 0.0–2.0)
Acid-base deficit: 4 mmol/L — ABNORMAL HIGH (ref 0.0–2.0)
Acid-base deficit: 5 mmol/L — ABNORMAL HIGH (ref 0.0–2.0)
Bicarbonate: 24.7 mmol/L (ref 20.0–28.0)
Bicarbonate: 22.2 mmol/L (ref 20.0–28.0)
Bicarbonate: 22 mmol/L (ref 20.0–28.0)
Bicarbonate: 21 mmol/L (ref 20.0–28.0)
O2 Saturation: 100 %
O2 Saturation: 100 %
O2 Saturation: 99 %
O2 Saturation: 99 %
PCO2 ART: 38.1 mmHg (ref 32.0–48.0)
PH ART: 7.365 (ref 7.350–7.450)
PH ART: 7.366 (ref 7.350–7.450)
Patient temperature: 35.3
Patient temperature: 37.3
Patient temperature: 37.2
TCO2: 26 mmol/L (ref 22–32)
TCO2: 23 mmol/L (ref 22–32)
TCO2: 23 mmol/L (ref 22–32)
TCO2: 22 mmol/L (ref 22–32)
pCO2 arterial: 43.2 mmHg (ref 32.0–48.0)
pCO2 arterial: 43.8 mmHg (ref 32.0–48.0)
pCO2 arterial: 42.4 mmHg (ref 32.0–48.0)
pH, Arterial: 7.31 — ABNORMAL LOW (ref 7.350–7.450)
pH, Arterial: 7.304 — ABNORMAL LOW (ref 7.350–7.450)
pO2, Arterial: 323 mmHg — ABNORMAL HIGH (ref 83.0–108.0)
pO2, Arterial: 180 mmHg — ABNORMAL HIGH (ref 83.0–108.0)
pO2, Arterial: 158 mmHg — ABNORMAL HIGH (ref 83.0–108.0)
pO2, Arterial: 168 mmHg — ABNORMAL HIGH (ref 83.0–108.0)

## 2018-08-05 LAB — CBC
HCT: 22.4 % — ABNORMAL LOW (ref 39.0–52.0)
HEMATOCRIT: 39.9 % (ref 39.0–52.0)
HEMATOCRIT: 27.5 % — AB (ref 39.0–52.0)
HEMOGLOBIN: 9.1 g/dL — AB (ref 13.0–17.0)
Hemoglobin: 13.3 g/dL (ref 13.0–17.0)
Hemoglobin: 7.3 g/dL — ABNORMAL LOW (ref 13.0–17.0)
MCH: 29.2 pg (ref 26.0–34.0)
MCH: 29.3 pg (ref 26.0–34.0)
MCH: 28.9 pg (ref 26.0–34.0)
MCHC: 33.3 g/dL (ref 30.0–36.0)
MCHC: 33.1 g/dL (ref 30.0–36.0)
MCHC: 32.6 g/dL (ref 30.0–36.0)
MCV: 87.7 fL (ref 78.0–100.0)
MCV: 88.4 fL (ref 78.0–100.0)
MCV: 88.5 fL (ref 78.0–100.0)
PLATELETS: 207 10*3/uL (ref 150–400)
Platelets: 145 10*3/uL — ABNORMAL LOW (ref 150–400)
Platelets: 152 10*3/uL (ref 150–400)
RBC: 4.55 MIL/uL (ref 4.22–5.81)
RBC: 3.11 MIL/uL — ABNORMAL LOW (ref 4.22–5.81)
RBC: 2.53 MIL/uL — ABNORMAL LOW (ref 4.22–5.81)
RDW: 13 % (ref 11.5–15.5)
RDW: 12.8 % (ref 11.5–15.5)
RDW: 12.6 % (ref 11.5–15.5)
WBC: 5.1 10*3/uL (ref 4.0–10.5)
WBC: 9.8 10*3/uL (ref 4.0–10.5)
WBC: 10.1 10*3/uL (ref 4.0–10.5)

## 2018-08-05 LAB — POCT I-STAT 4, (NA,K, GLUC, HGB,HCT)
GLUCOSE: 98 mg/dL (ref 70–99)
Glucose, Bld: 115 mg/dL — ABNORMAL HIGH (ref 70–99)
HCT: 19 % — ABNORMAL LOW (ref 39.0–52.0)
HEMATOCRIT: 26 % — AB (ref 39.0–52.0)
HEMOGLOBIN: 8.8 g/dL — AB (ref 13.0–17.0)
HEMOGLOBIN: 6.5 g/dL — AB (ref 13.0–17.0)
POTASSIUM: 3.9 mmol/L (ref 3.5–5.1)
Potassium: 4.2 mmol/L (ref 3.5–5.1)
SODIUM: 140 mmol/L (ref 135–145)
Sodium: 141 mmol/L (ref 135–145)

## 2018-08-05 LAB — PREPARE RBC (CROSSMATCH)

## 2018-08-05 LAB — APTT: aPTT: 37 seconds — ABNORMAL HIGH (ref 24–36)

## 2018-08-05 LAB — PLATELET COUNT: PLATELETS: 171 10*3/uL (ref 150–400)

## 2018-08-05 LAB — BASIC METABOLIC PANEL
ANION GAP: 9 (ref 5–15)
BUN: 18 mg/dL (ref 8–23)
CO2: 24 mmol/L (ref 22–32)
Calcium: 9.1 mg/dL (ref 8.9–10.3)
Chloride: 105 mmol/L (ref 98–111)
Creatinine, Ser: 1.05 mg/dL (ref 0.61–1.24)
Glucose, Bld: 90 mg/dL (ref 70–99)
POTASSIUM: 4 mmol/L (ref 3.5–5.1)
SODIUM: 138 mmol/L (ref 135–145)

## 2018-08-05 LAB — ECHO TEE: AVG: 8 mmHg

## 2018-08-05 LAB — PROTIME-INR
INR: 1.6
Prothrombin Time: 18.9 seconds — ABNORMAL HIGH (ref 11.4–15.2)

## 2018-08-05 LAB — CREATININE, SERUM
Creatinine, Ser: 0.92 mg/dL (ref 0.61–1.24)
GFR calc non Af Amer: >60 mL/min (ref >60)

## 2018-08-05 LAB — MAGNESIUM: MAGNESIUM: 2.2 mg/dL (ref 1.7–2.4)

## 2018-08-05 LAB — SURGICAL PCR SCREEN
MRSA, PCR: NEGATIVE
STAPHYLOCOCCUS AUREUS: NEGATIVE

## 2018-08-05 LAB — HEMOGLOBIN AND HEMATOCRIT, BLOOD
HCT: 27.1 % — ABNORMAL LOW (ref 39.0–52.0)
Hemoglobin: 9 g/dL — ABNORMAL LOW (ref 13.0–17.0)

## 2018-08-05 SURGERY — CORONARY ARTERY BYPASS GRAFTING (CABG)
Anesthesia: General | Site: Chest

## 2018-08-05 MED ORDER — HEMOSTATIC AGENTS (NO CHARGE) OPTIME
TOPICAL | Status: DC | PRN
  Administered 2018-08-05: 2 via TOPICAL

## 2018-08-05 MED ORDER — SODIUM CHLORIDE 0.9 % IV SOLN
1.5000 g | Freq: Two times a day (BID) | INTRAVENOUS | Status: AC
  Administered 2018-08-05 – 2018-08-07 (×4): 1.5 g via INTRAVENOUS
  Filled 2018-08-05 (×4): qty 1.5

## 2018-08-05 MED ORDER — ASPIRIN EC 325 MG PO TBEC
325.0000 mg | DELAYED_RELEASE_TABLET | Freq: Every day | ORAL | Status: DC
  Filled 2018-08-05: qty 1

## 2018-08-05 MED ORDER — SODIUM CHLORIDE 0.9% FLUSH
3.0000 mL | INTRAVENOUS | Status: DC | PRN
  Administered 2018-08-06: 3 mL via INTRAVENOUS
  Filled 2018-08-05: qty 3

## 2018-08-05 MED ORDER — METOPROLOL TARTRATE 25 MG/10 ML ORAL SUSPENSION: 12.5000 mg | Freq: Two times a day (BID) | ORAL | Status: DC

## 2018-08-05 MED ORDER — SODIUM CHLORIDE 0.9 % IV SOLN: 250.0000 mL | INTRAVENOUS | Status: DC

## 2018-08-05 MED ORDER — PROTAMINE SULFATE 10 MG/ML IV SOLN
INTRAVENOUS | Status: AC
  Filled 2018-08-05: qty 25

## 2018-08-05 MED ORDER — LACTATED RINGERS IV SOLN: INTRAVENOUS | Status: DC

## 2018-08-05 MED ORDER — NOREPINEPHRINE 4 MG/250ML-% IV SOLN
2.0000 ug/min | INTRAVENOUS | Status: DC
  Administered 2018-08-05 – 2018-08-06 (×2): 2 ug/min via INTRAVENOUS
  Filled 2018-08-05: qty 250

## 2018-08-05 MED ORDER — SODIUM CHLORIDE 0.9 % IV SOLN
INTRAVENOUS | Status: DC | PRN
  Administered 2018-08-05: 15 ug/min via INTRAVENOUS

## 2018-08-05 MED ORDER — FENTANYL CITRATE (PF) 250 MCG/5ML IJ SOLN
INTRAMUSCULAR | Status: AC
  Filled 2018-08-05: qty 25

## 2018-08-05 MED ORDER — PROPOFOL 10 MG/ML IV BOLUS
INTRAVENOUS | Status: AC
  Filled 2018-08-05: qty 20

## 2018-08-05 MED ORDER — LACTATED RINGERS IV SOLN
INTRAVENOUS | Status: DC | PRN
  Administered 2018-08-05: 08:00:00 via INTRAVENOUS

## 2018-08-05 MED ORDER — ROCURONIUM BROMIDE 50 MG/5ML IV SOSY
PREFILLED_SYRINGE | INTRAVENOUS | Status: AC
  Filled 2018-08-05: qty 5

## 2018-08-05 MED ORDER — ONDANSETRON HCL 4 MG/2ML IJ SOLN: 4.0000 mg | Freq: Four times a day (QID) | INTRAMUSCULAR | Status: DC | PRN

## 2018-08-05 MED ORDER — CALCIUM CHLORIDE 10 % IV SOLN: 1.0000 g | Freq: Once | INTRAVENOUS | Status: DC

## 2018-08-05 MED ORDER — ACETAMINOPHEN 500 MG PO TABS
1000.0000 mg | ORAL_TABLET | Freq: Four times a day (QID) | ORAL | Status: DC
  Administered 2018-08-05 – 2018-08-08 (×11): 1000 mg via ORAL
  Filled 2018-08-05 (×10): qty 2

## 2018-08-05 MED ORDER — POTASSIUM CHLORIDE 10 MEQ/50ML IV SOLN
10.0000 meq | INTRAVENOUS | Status: AC
  Administered 2018-08-05 (×3): 10 meq via INTRAVENOUS

## 2018-08-05 MED ORDER — VANCOMYCIN HCL 1000 MG IV SOLR
INTRAVENOUS | Status: DC | PRN
  Administered 2018-08-05: 1000 mL

## 2018-08-05 MED ORDER — BISACODYL 10 MG RE SUPP
10.0000 mg | Freq: Every day | RECTAL | Status: DC
  Filled 2018-08-05: qty 1

## 2018-08-05 MED ORDER — DEXMEDETOMIDINE HCL IN NACL 200 MCG/50ML IV SOLN
INTRAVENOUS | Status: AC
  Filled 2018-08-05: qty 50

## 2018-08-05 MED ORDER — MIDAZOLAM HCL 10 MG/2ML IJ SOLN
INTRAMUSCULAR | Status: AC
  Filled 2018-08-05: qty 2

## 2018-08-05 MED ORDER — PANTOPRAZOLE SODIUM 40 MG PO TBEC
40.0000 mg | DELAYED_RELEASE_TABLET | Freq: Every day | ORAL | Status: DC
  Administered 2018-08-07 – 2018-08-08 (×2): 40 mg via ORAL
  Filled 2018-08-05 (×2): qty 1

## 2018-08-05 MED ORDER — ROCURONIUM BROMIDE 10 MG/ML (PF) SYRINGE
PREFILLED_SYRINGE | INTRAVENOUS | Status: DC | PRN
  Administered 2018-08-05 (×3): 50 mg via INTRAVENOUS

## 2018-08-05 MED ORDER — SODIUM CHLORIDE 0.9% FLUSH: 10.0000 mL | INTRAVENOUS | Status: DC | PRN

## 2018-08-05 MED ORDER — ALBUMIN HUMAN 5 % IV SOLN
12.5000 g | Freq: Once | INTRAVENOUS | Status: DC
  Filled 2018-08-05: qty 250

## 2018-08-05 MED ORDER — ALBUMIN HUMAN 5 % IV SOLN
25.0000 g | Freq: Once | INTRAVENOUS | Status: AC
  Administered 2018-08-05: 25 g via INTRAVENOUS

## 2018-08-05 MED ORDER — FENTANYL CITRATE (PF) 100 MCG/2ML IJ SOLN
INTRAMUSCULAR | Status: DC | PRN
  Administered 2018-08-05: 150 ug via INTRAVENOUS
  Administered 2018-08-05: 100 ug via INTRAVENOUS
  Administered 2018-08-05: 50 ug via INTRAVENOUS
  Administered 2018-08-05: 100 ug via INTRAVENOUS
  Administered 2018-08-05: 300 ug via INTRAVENOUS
  Administered 2018-08-05 (×2): 150 ug via INTRAVENOUS
  Administered 2018-08-05: 50 ug via INTRAVENOUS
  Administered 2018-08-05 (×2): 100 ug via INTRAVENOUS

## 2018-08-05 MED ORDER — HEPARIN SODIUM (PORCINE) 1000 UNIT/ML IJ SOLN
INTRAMUSCULAR | Status: DC | PRN
  Administered 2018-08-05: 26000 [IU] via INTRAVENOUS

## 2018-08-05 MED ORDER — NITROGLYCERIN IN D5W 200-5 MCG/ML-% IV SOLN: 0.0000 ug/min | INTRAVENOUS | Status: DC

## 2018-08-05 MED ORDER — MORPHINE SULFATE (PF) 2 MG/ML IV SOLN: 1.0000 mg | INTRAVENOUS | Status: DC | PRN

## 2018-08-05 MED ORDER — METOCLOPRAMIDE HCL 5 MG/ML IJ SOLN
10.0000 mg | Freq: Four times a day (QID) | INTRAMUSCULAR | Status: DC
  Administered 2018-08-05 – 2018-08-08 (×11): 10 mg via INTRAVENOUS
  Filled 2018-08-05 (×11): qty 2

## 2018-08-05 MED ORDER — SODIUM CHLORIDE 0.9% FLUSH
10.0000 mL | Freq: Two times a day (BID) | INTRAVENOUS | Status: DC
  Administered 2018-08-05: 40 mL
  Administered 2018-08-06 – 2018-08-08 (×5): 10 mL

## 2018-08-05 MED ORDER — CLOPIDOGREL BISULFATE 75 MG PO TABS
75.0000 mg | ORAL_TABLET | Freq: Every day | ORAL | Status: DC
  Administered 2018-08-07 – 2018-08-11 (×5): 75 mg via ORAL
  Filled 2018-08-05 (×6): qty 1

## 2018-08-05 MED ORDER — ROCURONIUM BROMIDE 50 MG/5ML IV SOSY
PREFILLED_SYRINGE | INTRAVENOUS | Status: AC
  Filled 2018-08-05: qty 10

## 2018-08-05 MED ORDER — SODIUM CHLORIDE 0.9 % IV SOLN
INTRAVENOUS | Status: AC
  Administered 2018-08-05: 745 mL via INTRAVENOUS

## 2018-08-05 MED ORDER — DEXMEDETOMIDINE HCL IN NACL 200 MCG/50ML IV SOLN
0.0000 ug/kg/h | INTRAVENOUS | Status: DC
  Administered 2018-08-05: 0.4 ug/kg/h via INTRAVENOUS
  Filled 2018-08-05: qty 50

## 2018-08-05 MED ORDER — METOPROLOL TARTRATE 12.5 MG HALF TABLET: 12.5000 mg | ORAL_TABLET | Freq: Two times a day (BID) | ORAL | Status: DC

## 2018-08-05 MED ORDER — DEXMEDETOMIDINE HCL IN NACL 200 MCG/50ML IV SOLN
INTRAVENOUS | Status: DC | PRN
  Administered 2018-08-05: .3 ug/kg/h via INTRAVENOUS

## 2018-08-05 MED ORDER — FAMOTIDINE IN NACL 20-0.9 MG/50ML-% IV SOLN
20.0000 mg | Freq: Two times a day (BID) | INTRAVENOUS | Status: AC
  Administered 2018-08-05 (×2): 20 mg via INTRAVENOUS
  Filled 2018-08-05: qty 50

## 2018-08-05 MED ORDER — MIDAZOLAM HCL 5 MG/5ML IJ SOLN
INTRAMUSCULAR | Status: DC | PRN
  Administered 2018-08-05 (×2): 1 mg via INTRAVENOUS
  Administered 2018-08-05: 2 mg via INTRAVENOUS
  Administered 2018-08-05 (×5): 1 mg via INTRAVENOUS

## 2018-08-05 MED ORDER — EPHEDRINE SULFATE 50 MG/ML IJ SOLN
INTRAMUSCULAR | Status: DC | PRN
  Administered 2018-08-05 (×4): 5 mg via INTRAVENOUS

## 2018-08-05 MED ORDER — ALBUMIN HUMAN 5 % IV SOLN
INTRAVENOUS | Status: DC | PRN
  Administered 2018-08-05 (×2): via INTRAVENOUS

## 2018-08-05 MED ORDER — MORPHINE SULFATE (PF) 2 MG/ML IV SOLN
1.0000 mg | INTRAVENOUS | Status: DC | PRN
  Administered 2018-08-05: 2 mg via INTRAVENOUS
  Filled 2018-08-05: qty 1

## 2018-08-05 MED ORDER — PROTAMINE SULFATE 10 MG/ML IV SOLN
INTRAVENOUS | Status: DC | PRN
  Administered 2018-08-05: 10 mg via INTRAVENOUS
  Administered 2018-08-05: 230 mg via INTRAVENOUS

## 2018-08-05 MED ORDER — MAGNESIUM SULFATE 4 GM/100ML IV SOLN
4.0000 g | Freq: Once | INTRAVENOUS | Status: AC
  Administered 2018-08-05: 4 g via INTRAVENOUS
  Filled 2018-08-05: qty 100

## 2018-08-05 MED ORDER — SODIUM CHLORIDE 0.9 % IV SOLN
1.0000 g | Freq: Once | INTRAVENOUS | Status: DC
  Filled 2018-08-05: qty 10

## 2018-08-05 MED ORDER — PHENYLEPHRINE HCL-NACL 20-0.9 MG/250ML-% IV SOLN
0.0000 ug/min | INTRAVENOUS | Status: DC
  Administered 2018-08-05 (×2): 100 ug/min via INTRAVENOUS
  Administered 2018-08-05: 90 ug/min via INTRAVENOUS
  Administered 2018-08-05: 100 ug/min via INTRAVENOUS
  Administered 2018-08-06: 80 ug/min via INTRAVENOUS
  Filled 2018-08-05 (×4): qty 250

## 2018-08-05 MED ORDER — PHENYLEPHRINE 40 MCG/ML (10ML) SYRINGE FOR IV PUSH (FOR BLOOD PRESSURE SUPPORT)
PREFILLED_SYRINGE | INTRAVENOUS | Status: AC
  Filled 2018-08-05: qty 10

## 2018-08-05 MED ORDER — ASPIRIN EC 81 MG PO TBEC: 81.0000 mg | DELAYED_RELEASE_TABLET | Freq: Every day | ORAL | Status: DC

## 2018-08-05 MED ORDER — VANCOMYCIN HCL IN DEXTROSE 1-5 GM/200ML-% IV SOLN
1000.0000 mg | Freq: Once | INTRAVENOUS | Status: AC
  Administered 2018-08-05: 1000 mg via INTRAVENOUS
  Filled 2018-08-05: qty 200

## 2018-08-05 MED ORDER — CHLORHEXIDINE GLUCONATE 0.12 % MT SOLN
15.0000 mL | OROMUCOSAL | Status: AC
  Administered 2018-08-05: 15 mL via OROMUCOSAL

## 2018-08-05 MED ORDER — SODIUM CHLORIDE 0.9% FLUSH
3.0000 mL | Freq: Two times a day (BID) | INTRAVENOUS | Status: DC
  Administered 2018-08-06 – 2018-08-08 (×5): 3 mL via INTRAVENOUS

## 2018-08-05 MED ORDER — ALBUMIN HUMAN 5 % IV SOLN
250.0000 mL | INTRAVENOUS | Status: AC | PRN
  Administered 2018-08-05 (×4): 250 mL via INTRAVENOUS
  Filled 2018-08-05 (×4): qty 250

## 2018-08-05 MED ORDER — TRAMADOL HCL 50 MG PO TABS: 50.0000 mg | ORAL_TABLET | ORAL | Status: DC | PRN

## 2018-08-05 MED ORDER — SODIUM BICARBONATE 8.4 % IV SOLN
25.0000 meq | Freq: Once | INTRAVENOUS | Status: AC
  Administered 2018-08-05: 25 meq via INTRAVENOUS

## 2018-08-05 MED ORDER — EPHEDRINE 5 MG/ML INJ
INTRAVENOUS | Status: AC
  Filled 2018-08-05: qty 10

## 2018-08-05 MED ORDER — INSULIN REGULAR BOLUS VIA INFUSION
0.0000 [IU] | Freq: Three times a day (TID) | INTRAVENOUS | Status: DC
  Filled 2018-08-05: qty 10

## 2018-08-05 MED ORDER — SODIUM CHLORIDE 0.9 % IV SOLN
INTRAVENOUS | Status: DC
  Administered 2018-08-05 (×2): 250 mL via INTRAVENOUS

## 2018-08-05 MED ORDER — HEPARIN SODIUM (PORCINE) 1000 UNIT/ML IJ SOLN
INTRAMUSCULAR | Status: AC
  Filled 2018-08-05: qty 1

## 2018-08-05 MED ORDER — FENTANYL CITRATE (PF) 250 MCG/5ML IJ SOLN
INTRAMUSCULAR | Status: AC
  Filled 2018-08-05: qty 5

## 2018-08-05 MED ORDER — PROPOFOL 10 MG/ML IV BOLUS
INTRAVENOUS | Status: DC | PRN
  Administered 2018-08-05: 20 mg via INTRAVENOUS
  Administered 2018-08-05: 60 mg via INTRAVENOUS
  Administered 2018-08-05: 20 mg via INTRAVENOUS

## 2018-08-05 MED ORDER — ACETAMINOPHEN 160 MG/5ML PO SOLN: 650.0000 mg | Freq: Once | ORAL | Status: AC

## 2018-08-05 MED ORDER — PLASMA-LYTE 148 IV SOLN
INTRAVENOUS | Status: DC | PRN
  Administered 2018-08-05: 10:00:00 via INTRAVASCULAR

## 2018-08-05 MED ORDER — SODIUM CHLORIDE 0.9 % IV SOLN
INTRAVENOUS | Status: DC
  Filled 2018-08-05: qty 1

## 2018-08-05 MED ORDER — SODIUM CHLORIDE 0.9 % IV SOLN
INTRAVENOUS | Status: DC | PRN
  Administered 2018-08-05: .8 [IU]/h via INTRAVENOUS

## 2018-08-05 MED ORDER — MIDAZOLAM HCL 2 MG/2ML IJ SOLN: 2.0000 mg | INTRAMUSCULAR | Status: DC | PRN

## 2018-08-05 MED ORDER — ACETAMINOPHEN 160 MG/5ML PO SOLN: 1000.0000 mg | Freq: Four times a day (QID) | ORAL | Status: DC

## 2018-08-05 MED ORDER — 0.9 % SODIUM CHLORIDE (POUR BTL) OPTIME
TOPICAL | Status: DC | PRN
  Administered 2018-08-05: 5000 mL

## 2018-08-05 MED ORDER — PHENYLEPHRINE HCL 10 MG/ML IJ SOLN
INTRAMUSCULAR | Status: DC | PRN
  Administered 2018-08-05: 40 ug via INTRAVENOUS
  Administered 2018-08-05: 80 ug via INTRAVENOUS

## 2018-08-05 MED ORDER — DOCUSATE SODIUM 100 MG PO CAPS
200.0000 mg | ORAL_CAPSULE | Freq: Every day | ORAL | Status: DC
  Administered 2018-08-06 – 2018-08-08 (×3): 200 mg via ORAL
  Filled 2018-08-05 (×3): qty 2

## 2018-08-05 MED ORDER — SODIUM CHLORIDE 0.45 % IV SOLN
INTRAVENOUS | Status: DC | PRN
  Administered 2018-08-05: 500 mL via INTRAVENOUS

## 2018-08-05 MED ORDER — BISACODYL 5 MG PO TBEC
10.0000 mg | DELAYED_RELEASE_TABLET | Freq: Every day | ORAL | Status: DC
  Administered 2018-08-06 – 2018-08-08 (×3): 10 mg via ORAL
  Filled 2018-08-05 (×4): qty 2

## 2018-08-05 MED ORDER — LACTATED RINGERS IV SOLN
500.0000 mL | Freq: Once | INTRAVENOUS | Status: AC | PRN
  Administered 2018-08-05: 14:00:00 via INTRAVENOUS

## 2018-08-05 MED ORDER — METOPROLOL TARTRATE 5 MG/5ML IV SOLN: 2.5000 mg | INTRAVENOUS | Status: DC | PRN

## 2018-08-05 MED ORDER — SODIUM CHLORIDE 0.9% IV SOLUTION
Freq: Once | INTRAVENOUS | Status: AC
  Administered 2018-08-05: 1 mL via INTRAVENOUS

## 2018-08-05 MED ORDER — ASPIRIN 81 MG PO CHEW: 324.0000 mg | CHEWABLE_TABLET | Freq: Every day | ORAL | Status: DC

## 2018-08-05 MED ORDER — OXYCODONE HCL 5 MG PO TABS
5.0000 mg | ORAL_TABLET | ORAL | Status: DC | PRN
  Administered 2018-08-06 – 2018-08-07 (×4): 10 mg via ORAL
  Filled 2018-08-05 (×4): qty 2

## 2018-08-05 MED ORDER — CHLORHEXIDINE GLUCONATE CLOTH 2 % EX PADS
6.0000 | MEDICATED_PAD | Freq: Every day | CUTANEOUS | Status: DC
  Administered 2018-08-06 – 2018-08-07 (×2): 6 via TOPICAL

## 2018-08-05 MED ORDER — ACETAMINOPHEN 650 MG RE SUPP
650.0000 mg | Freq: Once | RECTAL | Status: AC
  Administered 2018-08-05: 650 mg via RECTAL

## 2018-08-05 SURGICAL SUPPLY — 90 items
BAG DECANTER FOR FLEXI CONT (MISCELLANEOUS) ×6 IMPLANT
BANDAGE ACE 4X5 VEL STRL LF (GAUZE/BANDAGES/DRESSINGS) ×3 IMPLANT
BANDAGE ACE 6X5 VEL STRL LF (GAUZE/BANDAGES/DRESSINGS) ×3 IMPLANT
BASKET HEART (ORDER IN 25'S) (MISCELLANEOUS) ×1
BASKET HEART (ORDER IN 25S) (MISCELLANEOUS) ×2 IMPLANT
BLADE CLIPPER SURG (BLADE) IMPLANT
BLADE STERNUM SYSTEM 6 (BLADE) ×3 IMPLANT
BNDG GAUZE ELAST 4 BULKY (GAUZE/BANDAGES/DRESSINGS) ×3 IMPLANT
CANISTER SUCT 3000ML PPV (MISCELLANEOUS) ×3 IMPLANT
CANNULA EZ GLIDE AORTIC 21FR (CANNULA) ×6 IMPLANT
CATH CPB KIT OWEN (MISCELLANEOUS) ×3 IMPLANT
CATH THORACIC 36FR (CATHETERS) ×3 IMPLANT
CLIP VESOCCLUDE MED 24/CT (CLIP) IMPLANT
CLIP VESOCCLUDE SM WIDE 24/CT (CLIP) IMPLANT
CRADLE DONUT ADULT HEAD (MISCELLANEOUS) ×3 IMPLANT
DRAIN CHANNEL 32F RND 10.7 FF (WOUND CARE) ×6 IMPLANT
DRAPE CARDIOVASCULAR INCISE (DRAPES) ×3
DRAPE INCISE IOBAN 66X45 STRL (DRAPES) ×3 IMPLANT
DRAPE SLUSH/WARMER DISC (DRAPES) ×3 IMPLANT
DRAPE SRG 135X102X78XABS (DRAPES) ×2 IMPLANT
DRSG AQUACEL AG ADV 3.5X14 (GAUZE/BANDAGES/DRESSINGS) ×1 IMPLANT
DRSG COVADERM 4X14 (GAUZE/BANDAGES/DRESSINGS) ×3 IMPLANT
ELECT BLADE 4.0 EZ CLEAN MEGAD (MISCELLANEOUS) ×3
ELECT REM PT RETURN 9FT ADLT (ELECTROSURGICAL) ×6
ELECTRODE BLDE 4.0 EZ CLN MEGD (MISCELLANEOUS) ×2 IMPLANT
ELECTRODE REM PT RTRN 9FT ADLT (ELECTROSURGICAL) ×4 IMPLANT
FELT TEFLON 1X6 (MISCELLANEOUS) ×6 IMPLANT
GAUZE SPONGE 4X4 12PLY STRL (GAUZE/BANDAGES/DRESSINGS) ×6 IMPLANT
GLOVE ORTHO TXT STRL SZ7.5 (GLOVE) ×6 IMPLANT
GOWN STRL REUS W/ TWL LRG LVL3 (GOWN DISPOSABLE) ×8 IMPLANT
GOWN STRL REUS W/TWL LRG LVL3 (GOWN DISPOSABLE) ×12
HEMOSTAT POWDER SURGIFOAM 1G (HEMOSTASIS) ×9 IMPLANT
INSERT FOGARTY XLG (MISCELLANEOUS) ×3 IMPLANT
KIT BASIN OR (CUSTOM PROCEDURE TRAY) ×3 IMPLANT
KIT SUCTION CATH 14FR (SUCTIONS) ×9 IMPLANT
KIT TURNOVER KIT B (KITS) ×3 IMPLANT
KIT VASOVIEW HEMOPRO VH 3000 (KITS) ×3 IMPLANT
LEAD PACING MYOCARDI (MISCELLANEOUS) ×3 IMPLANT
MARKER GRAFT CORONARY BYPASS (MISCELLANEOUS) ×9 IMPLANT
NS IRRIG 1000ML POUR BTL (IV SOLUTION) ×15 IMPLANT
PACK E OPEN HEART (SUTURE) ×3 IMPLANT
PACK OPEN HEART (CUSTOM PROCEDURE TRAY) ×3 IMPLANT
PAD ARMBOARD 7.5X6 YLW CONV (MISCELLANEOUS) ×6 IMPLANT
PAD ELECT DEFIB RADIOL ZOLL (MISCELLANEOUS) ×3 IMPLANT
PENCIL BUTTON HOLSTER BLD 10FT (ELECTRODE) ×3 IMPLANT
PUNCH AORTIC ROTATE 4.0MM (MISCELLANEOUS) IMPLANT
PUNCH AORTIC ROTATE 4.5MM 8IN (MISCELLANEOUS) IMPLANT
PUNCH AORTIC ROTATE 5MM 8IN (MISCELLANEOUS) IMPLANT
SET CARDIOPLEGIA MPS 5001102 (MISCELLANEOUS) ×1 IMPLANT
SOLUTION ANTI FOG 6CC (MISCELLANEOUS) IMPLANT
SPONGE LAP 18X18 X RAY DECT (DISPOSABLE) IMPLANT
SPONGE LAP 4X18 RFD (DISPOSABLE) IMPLANT
SUT BONE WAX W31G (SUTURE) ×3 IMPLANT
SUT ETHIBOND X763 2 0 SH 1 (SUTURE) ×6 IMPLANT
SUT MNCRL AB 3-0 PS2 18 (SUTURE) ×6 IMPLANT
SUT MNCRL AB 4-0 PS2 18 (SUTURE) IMPLANT
SUT PDS AB 1 CTX 36 (SUTURE) ×6 IMPLANT
SUT PROLENE 2 0 SH DA (SUTURE) IMPLANT
SUT PROLENE 3 0 SH DA (SUTURE) ×3 IMPLANT
SUT PROLENE 3 0 SH1 36 (SUTURE) IMPLANT
SUT PROLENE 4 0 RB 1 (SUTURE)
SUT PROLENE 4 0 SH DA (SUTURE) IMPLANT
SUT PROLENE 4-0 RB1 .5 CRCL 36 (SUTURE) IMPLANT
SUT PROLENE 5 0 C 1 36 (SUTURE) IMPLANT
SUT PROLENE 6 0 C 1 30 (SUTURE) IMPLANT
SUT PROLENE 7.0 RB 3 (SUTURE) ×9 IMPLANT
SUT PROLENE 8 0 BV175 6 (SUTURE) IMPLANT
SUT PROLENE BLUE 7 0 (SUTURE) ×3 IMPLANT
SUT PROLENE POLY MONO (SUTURE) IMPLANT
SUT SILK  1 MH (SUTURE) ×1
SUT SILK 1 MH (SUTURE) ×2 IMPLANT
SUT STEEL 6MS V (SUTURE) IMPLANT
SUT STEEL STERNAL CCS#1 18IN (SUTURE) IMPLANT
SUT STEEL SZ 6 DBL 3X14 BALL (SUTURE) IMPLANT
SUT VIC AB 1 CTX 36 (SUTURE)
SUT VIC AB 1 CTX36XBRD ANBCTR (SUTURE) IMPLANT
SUT VIC AB 2-0 CT1 27 (SUTURE)
SUT VIC AB 2-0 CT1 TAPERPNT 27 (SUTURE) IMPLANT
SUT VIC AB 2-0 CTX 27 (SUTURE) IMPLANT
SUT VIC AB 3-0 SH 27 (SUTURE)
SUT VIC AB 3-0 SH 27X BRD (SUTURE) IMPLANT
SUT VIC AB 3-0 X1 27 (SUTURE) IMPLANT
SUT VICRYL 4-0 PS2 18IN ABS (SUTURE) IMPLANT
SYSTEM SAHARA CHEST DRAIN ATS (WOUND CARE) ×3 IMPLANT
TOWEL GREEN STERILE (TOWEL DISPOSABLE) ×3 IMPLANT
TOWEL GREEN STERILE FF (TOWEL DISPOSABLE) ×3 IMPLANT
TRAY FOLEY SLVR 16FR TEMP STAT (SET/KITS/TRAYS/PACK) ×3 IMPLANT
TUBING INSUFFLATION (TUBING) ×3 IMPLANT
UNDERPAD 30X30 (UNDERPADS AND DIAPERS) ×3 IMPLANT
WATER STERILE IRR 1000ML POUR (IV SOLUTION) ×6 IMPLANT

## 2018-08-05 NOTE — Progress Notes (Signed)
CT surgery p.m. Rounds   Patient extubated after CABG x3 Significant volume requirements with spontaneous post pump diuresis Postop 6-hour labs show significant anemia hemoglobin 7.3-1 unit packed cells ordered.

## 2018-08-05 NOTE — Transfer of Care (Signed)
Immediate Anesthesia Transfer of Care Note  Patient: Timothy Simpson  Procedure(s) Performed: CORONARY ARTERY BYPASS GRAFTING (CABG) x3. Endoscopic Saphenous vein harvest. (N/A Chest) TRANSESOPHAGEAL ECHOCARDIOGRAM (TEE) (N/A )  Patient Location: ICU  Anesthesia Type:General  Level of Consciousness: Patient remains intubated per anesthesia plan  Airway & Oxygen Therapy: Patient remains intubated per anesthesia plan and Patient placed on Ventilator (see vital sign flow sheet for setting)  Post-op Assessment: Report given to RN and Post -op Vital signs reviewed and stable  Post vital signs: Reviewed and stable  Last Vitals:  Vitals Value Taken Time  BP    Temp    Pulse 88 08/05/2018 12:54 PM  Resp 12 08/05/2018 12:54 PM  SpO2 100 % 08/05/2018 12:54 PM  Vitals shown include unvalidated device data.  Last Pain:  Vitals:   08/05/18 0543  TempSrc: Oral  PainSc:       Patients Stated Pain Goal: 0 (97/67/34 1937)  Complications: No apparent anesthesia complications

## 2018-08-05 NOTE — Anesthesia Preprocedure Evaluation (Signed)
Anesthesia Evaluation  Patient identified by MRN, date of birth, ID band Patient awake    Reviewed: Allergy & Precautions, NPO status , Patient's Chart, lab work & pertinent test results  History of Anesthesia Complications Negative for: history of anesthetic complications  Airway Mallampati: III  TM Distance: <3 FB Neck ROM: Full    Dental  (+) Chipped, Poor Dentition   Pulmonary Current Smoker,    breath sounds clear to auscultation       Cardiovascular hypertension, Pt. on medications + CAD and + Past MI   Rhythm:Regular     Neuro/Psych negative neurological ROS  negative psych ROS   GI/Hepatic negative GI ROS, Neg liver ROS,   Endo/Other  negative endocrine ROS  Renal/GU negative Renal ROS     Musculoskeletal negative musculoskeletal ROS (+)   Abdominal   Peds  Hematology negative hematology ROS (+)   Anesthesia Other Findings Cardiac catheterization was performed today which showed complex anatomy at the distal left main with disease up to 50% followed by aneurysmal section.  95% ostial ramus stenosis, distal RCA stenosis is 100%, mid circumflex stenosis is 70%, and proximal LAD stenosis 60%.  Estimated left ventricular ejection fraction on cath is 55 to 65%.   Reproductive/Obstetrics                            Anesthesia Physical Anesthesia Plan  ASA: IV  Anesthesia Plan: General   Post-op Pain Management:    Induction: Intravenous  PONV Risk Score and Plan: 1 and Treatment may vary due to age or medical condition  Airway Management Planned: Oral ETT  Additional Equipment: Arterial line, CVP, PA Cath and TEE  Intra-op Plan:   Post-operative Plan: Post-operative intubation/ventilation  Informed Consent: I have reviewed the patients History and Physical, chart, labs and discussed the procedure including the risks, benefits and alternatives for the proposed anesthesia with  the patient or authorized representative who has indicated his/her understanding and acceptance.   Dental advisory given  Plan Discussed with: CRNA and Surgeon  Anesthesia Plan Comments:         Anesthesia Quick Evaluation

## 2018-08-05 NOTE — Progress Notes (Signed)
      [address]       [place] [address]             [phone]     CARDIOTHORACIC SURGERY PROGRESS NOTE  Subjective: Timothy Simpson has been scheduled for Procedure(s): CORONARY ARTERY BYPASS GRAFTING (CABG) (N/A) TRANSESOPHAGEAL ECHOCARDIOGRAM (TEE) (N/A) today.   Objective: Vital signs in last 24 hours: Temp:  [97.7 F (36.5 C)-98.6 F (37 C)] 98.4 F (36.9 C) (09/04 0543) Pulse Rate:  [58-96] 62 (09/04 0543) Cardiac Rhythm: Normal sinus rhythm (09/04 0700) Resp:  [12-20] 16 (09/04 0543) BP: (91-126)/(48-115) 107/74 (09/04 0543) SpO2:  [90 %-100 %] 95 % (09/04 0543) Weight:  [83.9 kg] 83.9 kg (09/04 0307)  Physical Exam: Unchanged from previously   Intake/Output from previous day: 09/03 0701 - 09/04 0700 In: 388.5 [P.O.:290; I.V.:98.5] Out: 650 [Urine:650] Intake/Output this shift: No intake/output data recorded.  Lab Results: Recent Labs    08/04/18 0611 08/05/18 0430  WBC 5.5 5.1  HGB 14.0 13.3  HCT 43.0 39.9  PLT 219 207   BMET:  Recent Labs    08/05/18 0430  NA 138  K 4.0  CL 105  CO2 24  GLUCOSE 90  BUN 18  CREATININE 1.05  CALCIUM 9.1    CBG (last 3)  No results for input(s): GLUCAP in the last 72 hours. PT/INR:  No results for input(s): LABPROT, INR in the last 72 hours.  Assessment/Plan:   The various methods of treatment have been discussed with the patient. After consideration of the risks, benefits and treatment options the patient has consented to the planned procedure.   The patient has been seen and labs reviewed. There are no changes in the patient's condition to prevent proceeding with the planned procedure today.   Rexene Alberts, MD 08/05/2018 7:29 AM

## 2018-08-05 NOTE — Op Note (Addendum)
CARDIOTHORACIC SURGERY OPERATIVE NOTE  Date of Procedure: 08/05/2018  Preoperative Diagnosis:   Severe Left Main Coronary Artery Disease  S/P Acute Non-ST Segment Elevation Myocardial Infarction  Postoperative Diagnosis: Same  Procedure:    Coronary Artery Bypass Grafting x 3   Left Internal Mammary Artery to Distal Left Anterior Descending Coronary Artery  Saphenous Vein Graft to Medial Sub-branch of Ramus Intermediate Branch Coronary Artery  Sequential Saphenous Vein Graft to Lateral Sub-branch of Ramus Intermediate Branch Coronary Artery  Endoscopic Vein Harvest from Right Thigh   Surgeon: [name], MD  Assistant: Ellwood Handler, PA-C  Anesthesia: Laurie Panda, MD  Operative Findings:  Normal left ventricular systolic function  Good quality left internal mammary artery conduit  Good quality saphenous vein conduit  Good quality target vessels for grafting    BRIEF CLINICAL NOTE AND INDICATIONS FOR SURGERY  Patient is a Timothy Simpson with no previous history of coronary artery disease but risk factors notable for history of hypertension, hyperlipidemia, and long-standing tobacco abuse.  He was in his usual state of health until July 28, 2018 when he presented with findings consistent with an acute non-ST segment elevation myocardial infarction.  Peak troponin level was 3.65.  Diagnostic cardiac catheterization was performed demonstrating 50% stenosis of the left main coronary artery and long segment 99% stenosis of a large bifurcating ramus intermediate branch.  Cardiothoracic surgical consultation was requested.  The patient subsequently underwent repeat catheterization with flow wire evaluation of the left main coronary artery and left anterior descending coronary artery.  These lesions were found to be hemodynamically significant.  Coronary artery bypass grafting was recommended.  The patient was counseled at length regarding the indications,  risks and potential benefits of surgery.  All questions have been answered, and the patient provides full informed consent for the operation as described.   DETAILS OF THE OPERATIVE PROCEDURE  Preparation:  The patient is brought to the operating room on the above mentioned date and central monitoring was established by the anesthesia team including placement of Swan-Ganz catheter and radial arterial line. The patient is placed in the supine position on the operating table.  Intravenous antibiotics are administered. General endotracheal anesthesia is induced uneventfully. A Foley catheter is placed.  Baseline transesophageal echocardiogram was performed.  Findings were notable for normal LV systolic function.  The patient's chest, abdomen, both groins, and both lower extremities are prepared and draped in a sterile manner. A time out procedure is performed.   Surgical Approach and Conduit Harvest:  A median sternotomy incision was performed and the left internal mammary artery is dissected from the chest wall and prepared for bypass grafting. The left internal mammary artery is notably good quality conduit. Simultaneously, the greater saphenous vein is obtained from the patient's right thigh using endoscopic vein harvest technique. The saphenous vein is notably good quality conduit. After removal of the saphenous vein, the small surgical incisions in the lower extremity are closed with absorbable suture. Following systemic heparinization, the left internal mammary artery was transected distally noted to have excellent flow.   Extracorporeal Cardiopulmonary Bypass and Myocardial Protection:  The pericardium is opened. The ascending aorta is normal in appearance. The ascending aorta and the right atrium are cannulated for cardiopulmonary bypass.  Adequate heparinization is verified.    The entire pre-bypass portion of the operation was notable for stable hemodynamics.  Cardiopulmonary bypass  was begun and the surface of the heart is inspected. Distal target vessels are selected for coronary artery bypass  grafting. A cardioplegia cannula is placed in the ascending aorta.  A temperature probe was placed in the interventricular septum.  The patient is allowed to cool passively to [facility] systemic temperature.  The aortic cross clamp is applied and cold blood cardioplegia is delivered initially in an antegrade fashion through the aortic root.   Iced saline slush is applied for topical hypothermia.  The initial cardioplegic arrest is rapid with early diastolic arrest.  Repeat doses of cardioplegia are administered intermittently throughout the entire cross clamp portion of the operation through the aortic root and through subsequently placed vein grafts in order to maintain completely flat electrocardiogram and septal myocardial temperature below 15C.  Myocardial protection was felt to be excellent.  Coronary Artery Bypass Grafting:   The medial sub-branch of the ramus intermediate branch coronary artery was grafted using a reversed saphenous vein graft in an side-to-side fashion.  At the site of distal anastomosis the target vessel was good quality and measured approximately 2.0 mm in diameter.  The lateral sub-branch branch of the ramus intermediate branch coronary artery was grafted in and end-to-side fashion using a sequential saphenous vein graft off of the distal segment of the vein graft placed to the medial sub-branch.  At the site of distal anastomosis the target vessel was good quality and measured approximately 2.0 mm in diameter.  The distal left anterior coronary artery was grafted with the left internal mammary artery in an end-to-side fashion.  At the site of distal anastomosis the target vessel was good quality and measured approximately 2.2 mm in diameter.  All proximal vein graft anastomoses were placed directly to the ascending aorta prior to removal of the aortic cross clamp.   The septal myocardial temperature rose rapidly after reperfusion of the left internal mammary artery graft.  The aortic cross clamp was removed after a total cross clamp time of 42 minutes.   Procedure Completion:  The single proximal and both distal coronary anastomoses were inspected for hemostasis and appropriate graft orientation. Epicardial pacing wires are fixed to the right ventricular outflow tract and to the right atrial appendage. The patient is rewarmed to 37C temperature. The patient is weaned and disconnected from cardiopulmonary bypass.  The patient's rhythm at separation from bypass was sinus.  The patient was weaned from cardiopulmonary bypass without any inotropic support. Total cardiopulmonary bypass time for the operation was Timothy minutes.  Followup transesophageal echocardiogram performed after separation from bypass revealed no changes from the preoperative exam.  The aortic and venous cannula were removed uneventfully. Protamine was administered to reverse the anticoagulation. The mediastinum and pleural space were inspected for hemostasis and irrigated with saline solution. The mediastinum and the left pleural space were drained using 3 chest tubes placed through separate stab incisions inferiorly.  The soft tissues anterior to the aorta were reapproximated loosely. The sternum is closed with double strength sternal wire. The soft tissues anterior to the sternum were closed in multiple layers and the skin is closed with a running subcuticular skin closure.  The post-bypass portion of the operation was notable for stable rhythm and hemodynamics.  No blood products were administered during the operation.   Disposition:  The patient tolerated the procedure well and is transported to the surgical intensive care in stable condition. There are no intraoperative complications. All sponge instrument and needle counts are verified correct at completion of the operation.    [name] MD 08/05/2018 12:10 PM

## 2018-08-05 NOTE — Brief Op Note (Signed)
07/28/2018 - 08/05/2018  12:10 PM  PATIENT:  Timothy Simpson  72 y.o. male  PRE-OPERATIVE DIAGNOSIS:  CAD  POST-OPERATIVE DIAGNOSIS:  * No post-op diagnosis entered *  PROCEDURE:  Procedure(s):  CORONARY ARTERY BYPASS GRAFTING x 3 -Left Internal Mammary Artery to Distal Left Anterior Descending -Sequential Saphenous Vein Graft to Medial and Lateral Brach of Ramus Intermediate  ENDOSCOPIC HARVEST GREATER SAPHENOUS VEIN -Right Thigh  TRANSESOPHAGEAL ECHOCARDIOGRAM (TEE) (N/A)  SURGEON:  Surgeon(s) and Role:    Rexene Alberts, MD - Primary  PHYSICIAN ASSISTANT: Ellwood Handler PA-C  ANESTHESIA:   general  EBL:  375 mL   BLOOD ADMINISTERED: CELLSAVER  DRAINS: Mediastinal Chest Drains   LOCAL MEDICATIONS USED:  NONE  SPECIMEN:  No Specimen  DISPOSITION OF SPECIMEN:  N/A  COUNTS:  YES  TOURNIQUET:  * No tourniquets in log *  DICTATION: .Dragon Dictation  PLAN OF CARE: Admit to inpatient   PATIENT DISPOSITION:  ICU - intubated and hemodynamically stable.   Delay start of Pharmacological VTE agent (>24hrs) due to surgical blood loss or risk of bleeding: yes

## 2018-08-05 NOTE — Anesthesia Procedure Notes (Signed)
Procedure Name: Intubation Date/Time: 08/05/2018 7:48 AM Performed by: Glynda Jaeger, CRNA Pre-anesthesia Checklist: Patient identified, Patient being monitored, Timeout performed, Emergency Drugs available and Suction available Patient Re-evaluated:Patient Re-evaluated prior to induction Oxygen Delivery Method: Circle System Utilized Preoxygenation: Pre-oxygenation with 100% oxygen Induction Type: IV induction Ventilation: Mask ventilation without difficulty and Oral airway inserted - appropriate to patient size Laryngoscope Size: Mac Grade View: Grade III Tube type: Oral Tube size: 8.0 mm Number of attempts: 1 Airway Equipment and Method: Stylet Placement Confirmation: ETT inserted through vocal cords under direct vision,  positive ETCO2 and breath sounds checked- equal and bilateral Secured at: 22 cm Tube secured with: Tape Dental Injury: Teeth and Oropharynx as per pre-operative assessment

## 2018-08-05 NOTE — Anesthesia Procedure Notes (Signed)
Central Venous Catheter Insertion Performed by: Audry Pili, MD, anesthesiologist Start/End9/03/2018 7:56 AM, 08/05/2018 7:59 AM Patient location: Pre-op. Preanesthetic checklist: patient identified, IV checked, risks and benefits discussed, surgical consent, monitors and equipment checked, pre-op evaluation, timeout performed and anesthesia consent Position: Trendelenburg Hand hygiene performed  and maximum sterile barriers used  Total catheter length 10. PA cath was placed.Swan type:thermodilution PA Cath depth:50 Procedure performed without using ultrasound guided technique. Attempts: 1 Patient tolerated the procedure well with no immediate complications.

## 2018-08-05 NOTE — Progress Notes (Signed)
Patient able to confirm name, DOB, procedure, allergies,npo status, no metal in body and no pain. Patient able to move over to OR table with minimal assistance.     Lattie Corns, RN

## 2018-08-05 NOTE — Anesthesia Procedure Notes (Addendum)
Central Venous Catheter Insertion Performed by: Audry Pili, MD, anesthesiologist Start/End9/03/2018 7:47 AM, 08/05/2018 7:59 AM Patient location: Pre-op. Preanesthetic checklist: patient identified, IV checked, risks and benefits discussed, surgical consent, monitors and equipment checked, pre-op evaluation, timeout performed and anesthesia consent Position: Trendelenburg Lidocaine 1% used for infiltration and patient sedated Hand hygiene performed , maximum sterile barriers used  and Seldinger technique used Catheter size: 8.5 Fr Central line was placed.MAC introducer Procedure performed using ultrasound guided technique. Ultrasound Notes:anatomy identified, needle tip was noted to be adjacent to the nerve/plexus identified, no ultrasound evidence of intravascular and/or intraneural injection and image(s) printed for medical record Attempts: 1 Following insertion, line sutured, dressing applied and Biopatch. Post procedure assessment: blood return through all ports, no air and free fluid flow  Patient tolerated the procedure well with no immediate complications.

## 2018-08-05 NOTE — Anesthesia Postprocedure Evaluation (Signed)
Anesthesia Post Note  Patient: Timothy Simpson  Procedure(s) Performed: CORONARY ARTERY BYPASS GRAFTING (CABG) x3. Endoscopic Saphenous vein harvest. (N/A Chest) TRANSESOPHAGEAL ECHOCARDIOGRAM (TEE) (N/A )     Patient location during evaluation: SICU Anesthesia Type: General Level of consciousness: sedated Pain management: pain level controlled Vital Signs Assessment: post-procedure vital signs reviewed and stable Respiratory status: patient remains intubated per anesthesia plan Cardiovascular status: stable Postop Assessment: no apparent nausea or vomiting Anesthetic complications: no    Last Vitals:  Vitals:   08/05/18 1515 08/05/18 1530  BP: 117/81 111/77  Pulse: 88 88  Resp: 12 12  Temp: (!) 36 C   SpO2: 100% 100%    Last Pain:  Vitals:   08/05/18 0543  TempSrc: Oral  PainSc:                  Antonisha Waskey

## 2018-08-05 NOTE — Procedures (Signed)
Extubation Procedure Note  Patient Details:   Name: Timothy Simpson DOB: 08/31/1946 MRN: [id number]   Airway Documentation:    Vent end date: 08/05/18 Vent end time: 1750   Evaluation  O2 sats: stable throughout Complications: No apparent complications Patient did tolerate procedure well. Bilateral Breath Sounds: Clear, Diminished   No   Patient had a VC of 1.1L and a NIF of -30. Cuff leak was heard prior to extubation and no stridor was noted. RN was at the bedside with RT during extubation. Patient was placed on a 4L [place].  Tiburcio Bash 08/05/2018, 5:58 PM

## 2018-08-05 NOTE — Anesthesia Procedure Notes (Signed)
Arterial Line Insertion Start/End9/03/2018 7:45 AM, 08/05/2018 7:51 AM Performed by: Gaylene Brooks, CRNA, CRNA  Patient location: Pre-op. Preanesthetic checklist: patient identified, IV checked, site marked, risks and benefits discussed, surgical consent, monitors and equipment checked, pre-op evaluation, timeout performed and anesthesia consent Patient sedated Right, radial was placed Catheter size: 20 G Hand hygiene performed  and maximum sterile barriers used  Allen's test indicative of satisfactory collateral circulation Attempts: 2 Procedure performed without using ultrasound guided technique. Following insertion, dressing applied and Biopatch. Post procedure assessment: normal and unchanged

## 2018-08-05 NOTE — Plan of Care (Signed)
Patient alert and oriented, monitoring as per protocol.  Patient feeling better after receiving unit of PRBCs.  BP better.

## 2018-08-05 NOTE — Progress Notes (Signed)
Patient was Extubated at 1750. He was drowsy, so we went ahead and bathed him to stimulate him a little more. With any slight movement his blood pressure would plummet (MAPs in the 50s). Just lowering his head slightly made him unstable and he could feel the difference. We did not dangle him, and we spoke with Dr. Prescott Gum on evening rounds about his status. He had already been given 6 albumins throughout the day. He ordered a half amp of bicarb, another albumin to be given with 1gm of Calcium chloride and to start Levophed at a low dose if needed. We called him back with the repeat CBC from 6hr post surgery labs. He ordered 1 unit of PRBCs for a HGB of 7.3.   Charlsie Quest

## 2018-08-06 ENCOUNTER — Inpatient Hospital Stay (HOSPITAL_COMMUNITY): Payer: Medicare HMO

## 2018-08-06 ENCOUNTER — Encounter (HOSPITAL_COMMUNITY): Payer: Self-pay | Admitting: Thoracic Surgery (Cardiothoracic Vascular Surgery)

## 2018-08-06 LAB — POCT I-STAT 4, (NA,K, GLUC, HGB,HCT)
GLUCOSE: 98 mg/dL (ref 70–99)
HEMATOCRIT: 26 % — AB (ref 39.0–52.0)
Hemoglobin: 8.8 g/dL — ABNORMAL LOW (ref 13.0–17.0)
POTASSIUM: 4.1 mmol/L (ref 3.5–5.1)
Sodium: 140 mmol/L (ref 135–145)

## 2018-08-06 LAB — MAGNESIUM
MAGNESIUM: 2.2 mg/dL (ref 1.7–2.4)
Magnesium: 2.2 mg/dL (ref 1.7–2.4)

## 2018-08-06 LAB — POCT I-STAT, CHEM 8
BUN: 12 mg/dL (ref 8–23)
CHLORIDE: 104 mmol/L (ref 98–111)
Calcium, Ion: 1.22 mmol/L (ref 1.15–1.40)
Creatinine, Ser: 0.9 mg/dL (ref 0.61–1.24)
Glucose, Bld: 179 mg/dL — ABNORMAL HIGH (ref 70–99)
HCT: 25 % — ABNORMAL LOW (ref 39.0–52.0)
Hemoglobin: 8.5 g/dL — ABNORMAL LOW (ref 13.0–17.0)
Potassium: 4.1 mmol/L (ref 3.5–5.1)
SODIUM: 137 mmol/L (ref 135–145)
TCO2: 22 mmol/L (ref 22–32)

## 2018-08-06 LAB — TYPE AND SCREEN
ABO/RH(D): O POS
Antibody Screen: NEGATIVE
Unit division: 0

## 2018-08-06 LAB — GLUCOSE, CAPILLARY
GLUCOSE-CAPILLARY: 100 mg/dL — AB (ref 70–99)
Glucose-Capillary: 133 mg/dL — ABNORMAL HIGH (ref 70–99)
Glucose-Capillary: 146 mg/dL — ABNORMAL HIGH (ref 70–99)
Glucose-Capillary: 87 mg/dL (ref 70–99)
Glucose-Capillary: 99 mg/dL (ref 70–99)
Glucose-Capillary: 99 mg/dL (ref 70–99)

## 2018-08-06 LAB — POCT I-STAT 3, ART BLOOD GAS (G3+)
Acid-base deficit: 6 mmol/L — ABNORMAL HIGH (ref 0.0–2.0)
Bicarbonate: 20.1 mmol/L (ref 20.0–28.0)
O2 Saturation: 99 %
PCO2 ART: 41.2 mmHg (ref 32.0–48.0)
PH ART: 7.3 — AB (ref 7.350–7.450)
Patient temperature: 37.6
TCO2: 21 mmol/L — ABNORMAL LOW (ref 22–32)
pO2, Arterial: 136 mmHg — ABNORMAL HIGH (ref 83.0–108.0)

## 2018-08-06 LAB — BASIC METABOLIC PANEL
ANION GAP: 9 (ref 5–15)
BUN: 12 mg/dL (ref 8–23)
CO2: 19 mmol/L — ABNORMAL LOW (ref 22–32)
CREATININE: 0.97 mg/dL (ref 0.61–1.24)
Calcium: 8.1 mg/dL — ABNORMAL LOW (ref 8.9–10.3)
Chloride: 110 mmol/L (ref 98–111)
GFR calc Af Amer: >60 mL/min (ref >60)
Glucose, Bld: 102 mg/dL — ABNORMAL HIGH (ref 70–99)
Potassium: 4.2 mmol/L (ref 3.5–5.1)
SODIUM: 138 mmol/L (ref 135–145)

## 2018-08-06 LAB — CBC
HCT: 27 % — ABNORMAL LOW (ref 39.0–52.0)
HEMATOCRIT: 25.1 % — AB (ref 39.0–52.0)
Hemoglobin: 9.1 g/dL — ABNORMAL LOW (ref 13.0–17.0)
Hemoglobin: 8.5 g/dL — ABNORMAL LOW (ref 13.0–17.0)
MCH: 29.4 pg (ref 26.0–34.0)
MCH: 29.4 pg (ref 26.0–34.0)
MCHC: 33.7 g/dL (ref 30.0–36.0)
MCHC: 33.9 g/dL (ref 30.0–36.0)
MCV: 87.4 fL (ref 78.0–100.0)
MCV: 86.9 fL (ref 78.0–100.0)
PLATELETS: 142 10*3/uL — AB (ref 150–400)
Platelets: 113 10*3/uL — ABNORMAL LOW (ref 150–400)
RBC: 3.09 MIL/uL — ABNORMAL LOW (ref 4.22–5.81)
RBC: 2.89 MIL/uL — ABNORMAL LOW (ref 4.22–5.81)
RDW: 13.3 % (ref 11.5–15.5)
RDW: 13.5 % (ref 11.5–15.5)
WBC: 10.3 10*3/uL (ref 4.0–10.5)
WBC: 9.6 10*3/uL (ref 4.0–10.5)

## 2018-08-06 LAB — BPAM RBC
Blood Product Expiration Date: 201910022359
ISSUE DATE / TIME: 201909042048
Unit Type and Rh: 5100

## 2018-08-06 LAB — HEMOGLOBIN AND HEMATOCRIT, BLOOD
HCT: 27.2 % — ABNORMAL LOW (ref 39.0–52.0)
Hemoglobin: 9.3 g/dL — ABNORMAL LOW (ref 13.0–17.0)

## 2018-08-06 LAB — CREATININE, SERUM
Creatinine, Ser: 1.01 mg/dL (ref 0.61–1.24)
GFR calc Af Amer: >60 mL/min (ref >60)
GFR calc non Af Amer: >60 mL/min (ref >60)

## 2018-08-06 MED ORDER — ASPIRIN EC 81 MG PO TBEC
81.0000 mg | DELAYED_RELEASE_TABLET | Freq: Every day | ORAL | Status: DC
  Administered 2018-08-06 – 2018-08-08 (×3): 81 mg via ORAL
  Filled 2018-08-06 (×3): qty 1

## 2018-08-06 MED ORDER — INSULIN ASPART 100 UNIT/ML ~~LOC~~ SOLN: 0.0000 [IU] | Freq: Three times a day (TID) | SUBCUTANEOUS | Status: DC

## 2018-08-06 MED ORDER — INSULIN ASPART 100 UNIT/ML ~~LOC~~ SOLN: 0.0000 [IU] | SUBCUTANEOUS | Status: DC

## 2018-08-06 MED ORDER — ENOXAPARIN SODIUM 40 MG/0.4ML ~~LOC~~ SOLN: 40.0000 mg | Freq: Every day | SUBCUTANEOUS | Status: DC

## 2018-08-06 MED ORDER — ASPIRIN 81 MG PO CHEW
CHEWABLE_TABLET | ORAL | Status: AC
  Filled 2018-08-06: qty 1

## 2018-08-06 MED ORDER — INSULIN ASPART 100 UNIT/ML ~~LOC~~ SOLN
0.0000 [IU] | SUBCUTANEOUS | Status: DC
  Administered 2018-08-06 – 2018-08-07 (×4): 2 [IU] via SUBCUTANEOUS

## 2018-08-06 MED ORDER — PHENOL 1.4 % MT LIQD
1.0000 | OROMUCOSAL | Status: DC | PRN
  Administered 2018-08-07: 1 via OROMUCOSAL
  Filled 2018-08-06: qty 177

## 2018-08-06 MED ORDER — ENOXAPARIN SODIUM 30 MG/0.3ML ~~LOC~~ SOLN
30.0000 mg | Freq: Every day | SUBCUTANEOUS | Status: DC
  Administered 2018-08-06 – 2018-08-07 (×2): 30 mg via SUBCUTANEOUS
  Filled 2018-08-06 (×2): qty 0.3

## 2018-08-06 NOTE — Discharge Instructions (Signed)

## 2018-08-06 NOTE — Progress Notes (Signed)
1 Day Post-Op Procedure(s) (LRB): CORONARY ARTERY BYPASS GRAFTING (CABG) x3. Endoscopic Saphenous vein harvest. (N/A) TRANSESOPHAGEAL ECHOCARDIOGRAM (TEE) (N/A) Subjective: No complaints  Objective: Vital signs in last 24 hours: Temp:  [95.4 F (35.2 C)-100.2 F (37.9 C)] 99 F (37.2 C) (09/05 0700) Pulse Rate:  [59-98] 85 (09/05 0700) Cardiac Rhythm: Normal sinus rhythm (09/04 2346) Resp:  [12-26] 19 (09/05 0700) BP: (77-139)/(50-87) 116/64 (09/05 0700) SpO2:  [94 %-100 %] 100 % (09/05 0700) Arterial Line BP: (74-137)/(44-72) 134/56 (09/05 0700) FiO2 (%):  [40 %-50 %] 40 % (09/04 1645) Weight:  [95.8 kg] 95.8 kg (09/05 0500)  Hemodynamic parameters for last 24 hours: PAP: (10-41)/(-2-24) 32/15 CO:  [2.8 L/min-4.6 L/min] 4.6 L/min CI:  [1.4 L/min/m2-2.2 L/min/m2] 2.2 L/min/m2  Intake/Output from previous day: 09/04 0701 - 09/05 0700 In: 7884.3 [I.V.:6143.6; Blood:636.7; IV Piggyback:1104] Out: 3399 [Urine:2270; Blood:375; Chest Tube:754] Intake/Output this shift: No intake/output data recorded.  General appearance: alert and cooperative Neurologic: intact Heart: regular rate and rhythm, S1, S2 normal, no murmur, click, rub or gallop Lungs: clear to auscultation bilaterally Extremities: extremities normal, atraumatic, no cyanosis or edema Wound: dressings dry  Lab Results: Recent Labs    08/05/18 1900  08/06/18 0346 08/06/18 0400  WBC 10.1  --  10.3  --   HGB 7.3*   < > 9.1* 8.8*  HCT 22.4*   < > 27.0* 26.0*  PLT 152  --  142*  --    < > = values in this interval not displayed.   BMET:  Recent Labs    08/05/18 0430  08/05/18 1859 08/05/18 1900 08/06/18 0346 08/06/18 0400  NA 138   < > 140  --  138 140  K 4.0   < > 4.2  --  4.2 4.1  CL 105   < > 107  --  110  --   CO2 24  --   --   --  19*  --   GLUCOSE 90   < > 101*  --  102* 98  BUN 18   < > 13  --  12  --   CREATININE 1.05   < > 0.80 0.92 0.97  --   CALCIUM 9.1  --   --   --  8.1*  --    < > =  values in this interval not displayed.    PT/INR:  Recent Labs    08/05/18 1300  LABPROT 18.9*  INR 1.60   ABG    Component Value Date/Time   PHART 7.300 (L) 08/06/2018 0355   HCO3 20.1 08/06/2018 0355   TCO2 21 (L) 08/06/2018 0355   ACIDBASEDEF 6.0 (H) 08/06/2018 0355   O2SAT 99.0 08/06/2018 0355   CBG (last 3)  Recent Labs    08/05/18 2318 08/06/18 0008 08/06/18 0341  GLUCAP 103* 99 87   CXR: mild bibasilar atelectasis  ECG: sinus, early repol, no ischemic changes.  Assessment/Plan: S/P Procedure(s) (LRB): CORONARY ARTERY BYPASS GRAFTING (CABG) x3. Endoscopic Saphenous vein harvest. (N/A) TRANSESOPHAGEAL ECHOCARDIOGRAM (TEE) (N/A)   POD 1 Normal EF preop Hemodynamics labile overnight and levophed had to be started but BP is fine this am on levophed 4 and some neo. Will wean as tolerated. CI is good. Hold beta blocker until off pressors.  Mobilize Diuresis once off pressors. Diabetes control: preop Hgb A1c was 6.1. Continue SSI. d/c tubes/lines Continue foley due to patient in ICU and urinary output monitoring See progression orders   LOS: 8 days  Timothy Simpson 08/06/2018

## 2018-08-06 NOTE — Progress Notes (Signed)
      [address]       [place] [address]             [phone]      POD # 1 CABG x 3  Up in chair BP (!) 98/56   Pulse (!) 102   Temp 98.1 F (36.7 C) (Oral)   Resp (!) 23   Ht 6\' 1"  (1.854 m)   Wt 95.8 kg   SpO2 100%   BMI 27.86 kg/m  On norepi at 2 mcg/min  Intake/Output Summary (Last 24 hours) at 08/06/2018 1818 Last data filed at 08/06/2018 1800 Gross per 24 hour  Intake 3401.08 ml  Output 2730 ml  Net 671.08 ml   K= 4.1, creatinine 0.90, Hct= 25  Krysta Bloomfield C. Roxan Hockey, MD [facility] [phone]'

## 2018-08-06 NOTE — Discharge Summary (Addendum)
Physician Discharge Summary  Patient ID: Timothy Simpson MRN: [id number] DOB/AGE: May 24, 1946 72 y.o.  Admit date: 07/28/2018 Discharge date: 08/11/2018  Admission Diagnoses:  Patient Active Problem List   Diagnosis Date Noted  . Coronary artery disease   . Essential hypertension   . Hyperlipidemia   . Gout   . Tobacco abuse   . NSTEMI (non-ST elevated myocardial infarction) ([place]) 07/29/2018   Discharge Diagnoses:   Patient Active Problem List   Diagnosis Date Noted  . 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 Coronary artery disease   . Essential hypertension   . Hyperlipidemia   . Gout   . Tobacco abuse   . NSTEMI (non-ST elevated myocardial infarction) ([place]) 07/29/2018   Discharged Condition: good  History of Present Illness:  Timothy Simpson is a 72 year old African-American male with no previous history of coronary artery disease but risk factors notable for history of hypertension, hyperlipidemia, and long-standing tobacco abuse.  He was in his usual state of health until 2 days ago when he developed new onset substernal chest discomfort shortly after supper.  Symptoms improved by the time the patient was evaluated in the emergency department and baseline EKG revealed sinus rhythm without acute EKG changes.  However, troponins were positive and the patient subsequently ruled in for an acute non-ST segment elevation myocardial infarction with peak troponin level 3.65.  The patient has been pain-free since admission.  I have personally reviewed the patient's transthoracic echocardiogram and diagnostic cardiac catheterization.  Echocardiogram demonstrates normal left ventricular systolic function.  Ejection fraction was estimated 60 to 65%.  There is some left ventricular hypertrophy.  The ascending aorta looks mildly dilated.  There is no significant valvular disease.  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  Cardiothoracic surgery consult was  requested.     Hospital Course:   The patient remained chest pain free prior to surgery.  He was evaluated by Dr. Roxy Simpson who felt after review of his catheterization that there would not be a suitable target for the right coronary territory.  There was also question of whether the left main stenosis was significant.  It was felt patient should undergo repeat catheterization with FFR to assess the disease of the left main.  This was done and showed the stenosis to be clinically significant, therefore bypass surgery was indicated.  The risks and benefits of the procedure were explained to the patient and he was agreeable to proceed.  He was taken to the operating room on 08/05/2018.  He underwent CABG x 3 utilizing LIMA to LAD and sequential SVG to Medial and Lateral branches of the Ramus Intermediate.  He also underwent endoscopic harvest of greater saphenous vein from his right thigh.  He tolerated the procedure without difficulty and was taken to the SICU in stable condition.  He was extubated the evening of surgery.  During his stay in the SICU, the patient was weaned off Neo-synephrine and Levophed drips as tolerated.  His chest tubes and arterial lines were removed without difficulty.  His blood pressure was labile so Lopressor was stopped.  He was ambulating independently.  He was felt medically stable for transfer to the telemetry unit on 08/08/2018. He did have sinus tachycardia the evening of 09/07 and the morning of being seen on 09/08.  His hemoglobin level was low at 6.7.  He was given a unit of blood with repeat levels remaining stable at 7.8.  His stool was checked for blood and was negative.  Echocardiogram  was obtained and showed no evidence of pericardial effusion.  On exam he is noted to have a hematoma in his Right upper extremity.  This is likely the source of bleeding from his [facility] procedure.  He remains slightly tachycardic and was started on Lopressor 25 mg BID.  He was edematous on exam and was  started on a short course of Lasix and potassium.  He was ambulating on room air. He has been tolerating a diet and had a bowel movement. His wounds are clean, dry, and there are no signs of infection. He is felt surgically stable for discharge today.  Significant Diagnostic Studies: angiography:    Mid LM to Dist LM lesion is 50% stenosed. 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  Ost Ramus lesion is 95% stenosed.  Lat Ramus lesion is 99% stenosed.  Dist RCA lesion is 100% stenosed.  Mid Cx lesion is 70% stenosed.  Prox LAD lesion is 60% stenosed.  The left ventricular systolic function is normal.  LV end diastolic pressure is normal. LVEDP 6 mm Hg.  The left ventricular ejection fraction is 55-65% by visual estimate.  There is no aortic valve stenosis.  Treatments: surgery:    Coronary Artery Bypass Grafting x 3              Left Internal Mammary Artery to Distal Left Anterior Descending Coronary Artery             Saphenous Vein Graft to Medial Sub-branch of Ramus Intermediate Branch Coronary Artery             Sequential Saphenous Vein Graft to Lateral Sub-branch of Ramus Intermediate Branch Coronary Artery             Endoscopic Vein Harvest from Right Thigh on 08/05/2018 by Dr. Roxy Simpson.  Discharge Exam: Blood pressure 106/84, pulse 88, temperature 98.3 F (36.8 C), temperature source Oral, resp. rate 18, height 6\' 1"  (1.854 m), weight 92.6 kg, SpO2 98 %.   Cardiovascular: Tachycardic Pulmonary: Slightly diminished at bases bilaterally Abdomen: Soft, non tender, bowel sounds present. Extremities: Mild bilateral lower extremity edema. Wounds: Clean and dry.  No erythema or signs of infection.  Disposition:  01-Home or Self Care    Discharge Instructions    Amb Referral to Cardiac Rehabilitation   Complete by:  As directed    Diagnosis:   CABG NSTEMI     CABG X ___:  3    Discharge Medications:  Allergies as of 08/11/2018   No Known Allergies      Medication List    STOP taking these medications   CRESTOR 20 MG tablet Generic drug:  rosuvastatin   irbesartan 150 MG tablet Commonly known as:  AVAPRO   predniSONE 20 MG tablet Commonly known as:  DELTASONE     TAKE these medications   allopurinol 100 MG tablet Commonly known as:  ZYLOPRIM Take 100 mg by mouth daily.   aspirin 81 MG EC tablet Take 1 tablet (81 mg total) by mouth daily.   atorvastatin 80 MG tablet Commonly known as:  LIPITOR Take 1 tablet (80 mg total) by mouth daily at 6 PM.   clopidogrel 75 MG tablet Commonly known as:  PLAVIX Take 1 tablet (75 mg total) by mouth daily.   ferrous sulfate 325 (65 FE) MG EC tablet Take 1 tablet (325 mg total) by mouth 2 (two) times daily.   furosemide 40 MG tablet Commonly known as:  LASIX Take 1 tablet (40 mg  total) by mouth daily for 5 days. Start taking on:  08/12/2018   metoprolol tartrate 25 MG tablet Commonly known as:  LOPRESSOR Take 1 tablet (25 mg total) by mouth 2 (two) times daily.   Potassium Chloride ER 20 MEQ Tbcr Take 10 mEq by mouth once for 1 dose. Start taking on:  08/12/2018   traMADol 50 MG tablet Commonly known as:  ULTRAM Take 1 tablet (50 mg total) by mouth every 6 (six) hours as needed for severe pain. What changed:  reasons to take this     The patient has been discharged on:   1.Beta Blocker:  Yes [ x  ]                              No   [   ]                              If No, reason:  2.Ace Inhibitor/ARB: Yes [   ]                                     No  [  x  ]                                     If No, reason:Labile BP  3.Statin:   Yes [ x  ]                  No  [   ]                  If No, reason:  4.Ecasa:  Yes  [ x  ]                  No   [   ]                  If No, reason:    Follow-up Information    Rexene Alberts, MD Follow up on 09/14/2018.   Specialty:  Cardiothoracic Surgery Why:  Appointment is at 2:00, please get CXR at 1:30 at [place] located on first floor of our office building Contact information: [place] [address] [phone]        [facility], Angela Nicole, [place]. Go on 08/24/2018.   Specialties:  Physician Assistant, Cardiology, Radiology Why:  APpointment time is at 2:00 pm Contact information: [place] [address] [phone]        Jani Gravel, MD Follow up.   Specialty:  Internal Medicine Why:  Please set up appointment for 1 week for repeat labs Contact information: [place] [address] [phone]        Leonie Man, MD .   Specialty:  Cardiology Contact information: [address] [place] [address] [phone]           Signed: Ellwood Handler 08/11/2018, 8:06 AM

## 2018-08-07 ENCOUNTER — Inpatient Hospital Stay (HOSPITAL_COMMUNITY): Payer: Medicare HMO

## 2018-08-07 DIAGNOSIS — Z951 Presence of aortocoronary bypass graft: Secondary | ICD-10-CM

## 2018-08-07 LAB — CBC
HCT: 26.3 % — ABNORMAL LOW (ref 39.0–52.0)
HEMOGLOBIN: 8.7 g/dL — AB (ref 13.0–17.0)
MCH: 28.8 pg (ref 26.0–34.0)
MCHC: 33.1 g/dL (ref 30.0–36.0)
MCV: 87.1 fL (ref 78.0–100.0)
Platelets: DECREASED 10*3/uL (ref 150–400)
RBC: 3.02 MIL/uL — AB (ref 4.22–5.81)
RDW: 13.5 % (ref 11.5–15.5)
WBC: 12.5 10*3/uL — ABNORMAL HIGH (ref 4.0–10.5)

## 2018-08-07 LAB — GLUCOSE, CAPILLARY
GLUCOSE-CAPILLARY: 112 mg/dL — AB (ref 70–99)
GLUCOSE-CAPILLARY: 123 mg/dL — AB (ref 70–99)
GLUCOSE-CAPILLARY: 126 mg/dL — AB (ref 70–99)
Glucose-Capillary: 145 mg/dL — ABNORMAL HIGH (ref 70–99)
Glucose-Capillary: 126 mg/dL — ABNORMAL HIGH (ref 70–99)
Glucose-Capillary: 117 mg/dL — ABNORMAL HIGH (ref 70–99)
Glucose-Capillary: 148 mg/dL — ABNORMAL HIGH (ref 70–99)

## 2018-08-07 LAB — BASIC METABOLIC PANEL
ANION GAP: 8 (ref 5–15)
BUN: 13 mg/dL (ref 8–23)
CALCIUM: 8.5 mg/dL — AB (ref 8.9–10.3)
CHLORIDE: 105 mmol/L (ref 98–111)
CO2: 24 mmol/L (ref 22–32)
Creatinine, Ser: 0.98 mg/dL (ref 0.61–1.24)
GFR calc non Af Amer: >60 mL/min (ref >60)
Glucose, Bld: 127 mg/dL — ABNORMAL HIGH (ref 70–99)
Potassium: 3.7 mmol/L (ref 3.5–5.1)
Sodium: 137 mmol/L (ref 135–145)

## 2018-08-07 MED ORDER — METOPROLOL TARTRATE 12.5 MG HALF TABLET
12.5000 mg | ORAL_TABLET | Freq: Two times a day (BID) | ORAL | Status: DC
  Administered 2018-08-07 – 2018-08-08 (×3): 12.5 mg via ORAL
  Filled 2018-08-07 (×3): qty 1

## 2018-08-07 MED ORDER — POTASSIUM CHLORIDE 10 MEQ/50ML IV SOLN
10.0000 meq | INTRAVENOUS | Status: AC
  Administered 2018-08-07 (×3): 10 meq via INTRAVENOUS
  Filled 2018-08-07 (×2): qty 50

## 2018-08-07 MED ORDER — INSULIN ASPART 100 UNIT/ML ~~LOC~~ SOLN
0.0000 [IU] | Freq: Three times a day (TID) | SUBCUTANEOUS | Status: DC
  Administered 2018-08-07: 2 [IU] via SUBCUTANEOUS

## 2018-08-07 MED ORDER — POTASSIUM CHLORIDE CRYS ER 20 MEQ PO TBCR
20.0000 meq | EXTENDED_RELEASE_TABLET | Freq: Every day | ORAL | Status: DC
  Administered 2018-08-07 – 2018-08-08 (×2): 20 meq via ORAL
  Filled 2018-08-07 (×2): qty 1

## 2018-08-07 MED FILL — Potassium Chloride Inj 2 mEq/ML: INTRAVENOUS | Qty: 40 | Status: AC

## 2018-08-07 MED FILL — Heparin Sodium (Porcine) Inj 1000 Unit/ML: INTRAMUSCULAR | Qty: 2500 | Status: AC

## 2018-08-07 MED FILL — Vancomycin HCl For IV Soln 1 GM (Base Equivalent): INTRAVENOUS | Qty: 1000 | Status: AC

## 2018-08-07 MED FILL — Mannitol IV Soln 20%: INTRAVENOUS | Qty: 500 | Status: AC

## 2018-08-07 MED FILL — Sodium Bicarbonate IV Soln 8.4%: INTRAVENOUS | Qty: 50 | Status: AC

## 2018-08-07 MED FILL — Heparin Sodium (Porcine) Inj 1000 Unit/ML: INTRAMUSCULAR | Qty: 10 | Status: AC

## 2018-08-07 MED FILL — Lidocaine HCl(Cardiac) IV PF Soln Pref Syr 100 MG/5ML (2%): INTRAVENOUS | Qty: 5 | Status: AC

## 2018-08-07 MED FILL — Electrolyte-R (PH 7.4) Solution: INTRAVENOUS | Qty: 4000 | Status: AC

## 2018-08-07 MED FILL — Heparin Sodium (Porcine) Inj 1000 Unit/ML: INTRAMUSCULAR | Qty: 30 | Status: AC

## 2018-08-07 MED FILL — Sodium Chloride IV Soln 0.9%: INTRAVENOUS | Qty: 2000 | Status: AC

## 2018-08-07 MED FILL — Dexmedetomidine HCl in NaCl 0.9% IV Soln 400 MCG/100ML: INTRAVENOUS | Qty: 100 | Status: AC

## 2018-08-07 MED FILL — Magnesium Sulfate Inj 50%: INTRAMUSCULAR | Qty: 10 | Status: AC

## 2018-08-07 NOTE — Progress Notes (Signed)
Patient ID: Timothy Simpson, male   DOB: [birth year], 72 y.o.   MRN: [id number] TCTS Evening Rounds:  Hemodynamically stable with low normal BP 97/61 off pressors. sats 96% Urine output good.

## 2018-08-07 NOTE — Plan of Care (Signed)
  Problem: Education: Goal: Knowledge of General Education information will improve Description Including pain rating scale, medication(s)/side effects and non-pharmacologic comfort measures Outcome: Progressing   Problem: Health Behavior/Discharge Planning: Goal: Ability to manage health-related needs will improve Outcome: Progressing   Problem: Activity: Goal: Risk for activity intolerance will decrease Outcome: Progressing   Problem: Nutrition: Goal: Adequate nutrition will be maintained Outcome: Progressing   Problem: Coping: Goal: Level of anxiety will decrease Outcome: Progressing   Problem: Education: Goal: Understanding of CV disease, CV risk reduction, and recovery process will improve Outcome: Progressing

## 2018-08-07 NOTE — Progress Notes (Addendum)
[address]       [place] [address]             [phone]    2 Days Post-Op Procedure(s) (LRB): CORONARY ARTERY BYPASS GRAFTING (CABG) x3. Endoscopic Saphenous vein harvest. (N/A) TRANSESOPHAGEAL ECHOCARDIOGRAM (TEE) (N/A)   Subjective:  Up in chair, getting cleaned up.  States he feels good and is ready to go.  He has already ambulated around the unit this morning.  No BM  Objective: Vital signs in last 24 hours: Temp:  [97.7 F (36.5 C)-98.6 F (37 C)] 98.2 F (36.8 C) (09/06 0731) Pulse Rate:  [38-147] 103 (09/06 0900) Cardiac Rhythm: Sinus tachycardia;Normal sinus rhythm (09/06 0741) Resp:  [16-33] 18 (09/06 0900) BP: (81-127)/(51-114) 87/54 (09/06 0900) SpO2:  [87 %-100 %] 98 % (09/06 0900) Weight:  [102.6 kg] 102.6 kg (09/06 0600)  Intake/Output from previous day: 09/05 0701 - 09/06 0700 In: 1139.6 [P.O.:600; I.V.:339.6; IV Piggyback:200] Out: 2235 [Urine:1685; Chest Tube:550] Intake/Output this shift: Total I/O In: 63.6 [IV Piggyback:63.6] Out: 75 [Urine:75]  General appearance: alert and cooperative Heart: regular rate and rhythm and tachy Lungs: diminished breath sounds bibasilar Abdomen: soft, non-tender; bowel sounds normal; no masses,  no organomegaly Extremities: edema trace Wound: clean and dry EVH site, aquacel remains in place on sternum  Lab Results: Recent Labs    08/06/18 1633 08/06/18 1639 08/07/18 0520  WBC 9.6  --  12.5*  HGB 8.5* 8.5* 8.7*  HCT 25.1* 25.0* 26.3*  PLT 113*  --  PLATELET CLUMPS NOTED ON SMEAR, COUNT APPEARS DECREASED   BMET:  Recent Labs    08/06/18 0346  08/06/18 1639 08/07/18 0520  NA 138   < > 137 137  K 4.2   < > 4.1 3.7  CL 110  --  104 105  CO2 19*  --   --  24  GLUCOSE 102*   < > 179* 127*  BUN 12  --  12 13  CREATININE 0.97   < > 0.90 0.98  CALCIUM 8.1*  --   --  8.5*   < > = values in this interval not displayed.    PT/INR:  Recent Labs    08/05/18 1300  LABPROT 18.9*    INR 1.60   ABG    Component Value Date/Time   PHART 7.300 (L) 08/06/2018 0355   HCO3 20.1 08/06/2018 0355   TCO2 22 08/06/2018 1639   ACIDBASEDEF 6.0 (H) 08/06/2018 0355   O2SAT 99.0 08/06/2018 0355   CBG (last 3)  Recent Labs    08/07/18 0039 08/07/18 0528 08/07/18 0733  GLUCAP 112* 126* 123*    Assessment/Plan: S/P Procedure(s) (LRB): CORONARY ARTERY BYPASS GRAFTING (CABG) x3. Endoscopic Saphenous vein harvest. (N/A) TRANSESOPHAGEAL ECHOCARDIOGRAM (TEE) (N/A)  1. CV- Sinus Tachycardia, off all drips for BP, it remains a little with SBP around 100, would benefit from BB for better HR control, will start low dose Lopressor as BP allows, will place parameters for adminstration 2. Pulm- wean oxygen as tolerated, CXR free from pneumothorax, + atelectasis bilaterally, no significant effusions, will d/c chest tubes today 3. Renal- creatinine remains stable, K at 3.7 being replaced per ICU protocol, minimal edema, will hold off on diuretics or now with labile BP, will start low dose potassium supplement in AM and can possibly start Lasix if BP remains stable 4. Expected post operative blood loss anemia, mild Hgb stable at 8.7 5. CBGs remain controlled, patient is not a  diabetic, will continue SSIP as only POD #2, can likely d/c tomorrow 6. Dispo- patient with sinus tachycardia, will start low dose BB as BP allows, d/c chest tubes, foley catheters, will leave central line in place today in case he would develop Atrial Fibrillation, can remove once HR is stable, possibly transfer to step down unit later today   LOS: 9 days    Erin Barrett 08/07/2018 Patient seen and examined, agree with above Looks better, BP still relatively low Probably transfer to [facility] C. Roxan Hockey, MD [facility] [phone]

## 2018-08-08 ENCOUNTER — Inpatient Hospital Stay (HOSPITAL_COMMUNITY): Payer: Medicare HMO

## 2018-08-08 LAB — GLUCOSE, CAPILLARY
GLUCOSE-CAPILLARY: 87 mg/dL (ref 70–99)
Glucose-Capillary: 100 mg/dL — ABNORMAL HIGH (ref 70–99)

## 2018-08-08 LAB — BASIC METABOLIC PANEL
Anion gap: 8 (ref 5–15)
BUN: 11 mg/dL (ref 8–23)
CHLORIDE: 107 mmol/L (ref 98–111)
CO2: 25 mmol/L (ref 22–32)
CREATININE: 0.95 mg/dL (ref 0.61–1.24)
Calcium: 8.5 mg/dL — ABNORMAL LOW (ref 8.9–10.3)
GFR calc non Af Amer: >60 mL/min (ref >60)
Glucose, Bld: 113 mg/dL — ABNORMAL HIGH (ref 70–99)
Potassium: 3.8 mmol/L (ref 3.5–5.1)
SODIUM: 140 mmol/L (ref 135–145)

## 2018-08-08 LAB — CBC
HCT: 20.9 % — ABNORMAL LOW (ref 39.0–52.0)
Hemoglobin: 7 g/dL — ABNORMAL LOW (ref 13.0–17.0)
MCH: 29.5 pg (ref 26.0–34.0)
MCHC: 33.5 g/dL (ref 30.0–36.0)
MCV: 88.2 fL (ref 78.0–100.0)
PLATELETS: 115 10*3/uL — AB (ref 150–400)
RBC: 2.37 MIL/uL — AB (ref 4.22–5.81)
RDW: 13.6 % (ref 11.5–15.5)
WBC: 13.3 10*3/uL — AB (ref 4.0–10.5)

## 2018-08-08 MED ORDER — SODIUM CHLORIDE 0.9% FLUSH
3.0000 mL | Freq: Two times a day (BID) | INTRAVENOUS | Status: DC
  Administered 2018-08-08 – 2018-08-10 (×5): 3 mL via INTRAVENOUS

## 2018-08-08 MED ORDER — SODIUM CHLORIDE 0.9% FLUSH: 3.0000 mL | INTRAVENOUS | Status: DC | PRN

## 2018-08-08 MED ORDER — ASPIRIN EC 81 MG PO TBEC
81.0000 mg | DELAYED_RELEASE_TABLET | Freq: Every day | ORAL | Status: DC
  Administered 2018-08-09 – 2018-08-11 (×3): 81 mg via ORAL
  Filled 2018-08-08 (×3): qty 1

## 2018-08-08 MED ORDER — MOVING RIGHT ALONG BOOK
Freq: Once | Status: DC
  Filled 2018-08-08: qty 1

## 2018-08-08 MED ORDER — SODIUM CHLORIDE 0.9 % IV SOLN: 250.0000 mL | INTRAVENOUS | Status: DC | PRN

## 2018-08-08 NOTE — Progress Notes (Signed)
Patient transferred via wheelchair to 4East room 27 on cardiac monitor.  NAD noted. Denies chest pain or SHOB.  All personal belongings accompanied patient.  Patient's son, Timothy Simpson called and updated of transfer to 4E-27 per patient request who verbalized understanding.

## 2018-08-08 NOTE — Progress Notes (Signed)
CARDIAC REHAB PHASE I   PRE:  Rate/Rhythm: 106 sinus tachycardia  BP:  Supine:   Sitting: 101/59  Standing:    SaO2: 96% ra   MODE:  Ambulation: 350  ft   POST:  Rate/Rhythem: 145 sinus tachycardia with ambulation, pace slowed down  BP:  Supine:   Sitting: 108/73  Standing:    SaO2: 94% ra   1325-1410 Pt ambulated in hallway x1 assist using rolling walker.  Pt with quick, steady gait, asymptomatic. Pt exceeded THR with ambulation, gait slowed to lower heart rate.  Pt returned to chair, call light in reach.  Education completed including risk factor modification, sternal precautions utilizing In the Tube technique,  exercise, and medication compliance.  Pt oriented to outpatient cardiac rehab.  At pt request, referral will be sent to [facility].  Understanding verbalized Andi Hence, RN, BSN Cardiac Pulmonary Rehab   [facility]

## 2018-08-08 NOTE — Progress Notes (Signed)
3 Days Post-Op Procedure(s) (LRB): CORONARY ARTERY BYPASS GRAFTING (CABG) x3. Endoscopic Saphenous vein harvest. (N/A) TRANSESOPHAGEAL ECHOCARDIOGRAM (TEE) (N/A) Subjective: No complaints. Wants to go home. Ambulating well without dizziness, chest pain or SOB.  Objective: Vital signs in last 24 hours: Temp:  [98.1 F (36.7 C)-99.7 F (37.6 C)] 98.5 F (36.9 C) (09/07 1100) Pulse Rate:  [65-143] 84 (09/07 0915) Cardiac Rhythm: Sinus tachycardia (09/07 0815) Resp:  [18-32] 22 (09/07 1000) BP: (82-111)/(50-75) 88/60 (09/07 1000) SpO2:  [94 %-100 %] 97 % (09/07 0915) Weight:  [94.7 kg] 94.7 kg (09/07 0600)  Hemodynamic parameters for last 24 hours:    Intake/Output from previous day: 09/06 0701 - 09/07 0700 In: 903.6 [P.O.:840; IV Piggyback:63.6] Out: 1060 [Urine:1060] Intake/Output this shift: Total I/O In: 300 [P.O.:300] Out: -   General appearance: alert and cooperative Neurologic: intact Heart: regular rate and rhythm, S1, S2 normal, no murmur, click, rub or gallop Lungs: clear Incisions healing well.  Lab Results: Recent Labs    08/07/18 0520 08/08/18 0437  WBC 12.5* 13.3*  HGB 8.7* 7.0*  HCT 26.3* 20.9*  PLT PLATELET CLUMPS NOTED ON SMEAR, COUNT APPEARS DECREASED 115*   BMET:  Recent Labs    08/07/18 0520 08/08/18 0437  NA 137 140  K 3.7 3.8  CL 105 107  CO2 24 25  GLUCOSE 127* 113*  BUN 13 11  CREATININE 0.98 0.95  CALCIUM 8.5* 8.5*    PT/INR:  Recent Labs    08/05/18 1300  LABPROT 18.9*  INR 1.60   ABG    Component Value Date/Time   PHART 7.300 (L) 08/06/2018 0355   HCO3 20.1 08/06/2018 0355   TCO2 22 08/06/2018 1639   ACIDBASEDEF 6.0 (H) 08/06/2018 0355   O2SAT 99.0 08/06/2018 0355   CBG (last 3)  Recent Labs    08/07/18 1604 08/07/18 1932 08/08/18 0744  GLUCAP 148* 126* 100*    Assessment/Plan: S/P Procedure(s) (LRB): CORONARY ARTERY BYPASS GRAFTING (CABG) x3. Endoscopic Saphenous vein harvest. (N/A) TRANSESOPHAGEAL  ECHOCARDIOGRAM (TEE) (N/A)  He has low normal BP and I think it is probably best to hold off on Lopressor for now and consider resuming it as outpt when he comes back. He was on Avapro preop but would not resume with his low normal BP.  DC sleeve, pacing wires. Transfer to 4E and plan home tomorrow.   LOS: 10 days    Gaye Pollack 08/08/2018

## 2018-08-08 NOTE — Progress Notes (Signed)
Spoke with Jerilee Hoh re d/c CVC order.  RN aware.

## 2018-08-08 NOTE — Progress Notes (Signed)
08/08/2018 1535 Pt placed in room 4E-27 after transfer from [place] via wheelchair with staff. Pt oriented to room, call bell, and bed. Call bell within reach. CHG bath given. Tele applied CCMD notified. Pt in no discomfort.   Amanda Cockayne, RN

## 2018-08-09 DIAGNOSIS — E7800 Pure hypercholesterolemia, unspecified: Secondary | ICD-10-CM

## 2018-08-09 LAB — PREPARE RBC (CROSSMATCH)

## 2018-08-09 LAB — CBC
HCT: 20.5 % — ABNORMAL LOW (ref 39.0–52.0)
Hemoglobin: 6.7 g/dL — CL (ref 13.0–17.0)
MCH: 29.3 pg (ref 26.0–34.0)
MCHC: 32.7 g/dL (ref 30.0–36.0)
MCV: 89.5 fL (ref 78.0–100.0)
Platelets: 127 10*3/uL — ABNORMAL LOW (ref 150–400)
RBC: 2.29 MIL/uL — ABNORMAL LOW (ref 4.22–5.81)
RDW: 14.2 % (ref 11.5–15.5)
WBC: 12.4 10*3/uL — ABNORMAL HIGH (ref 4.0–10.5)

## 2018-08-09 MED ORDER — METOPROLOL TARTRATE 25 MG PO TABS: 12.5000 mg | ORAL_TABLET | Freq: Two times a day (BID) | ORAL | 1 refills | Status: DC

## 2018-08-09 MED ORDER — CLOPIDOGREL BISULFATE 75 MG PO TABS: 75.0000 mg | ORAL_TABLET | Freq: Every day | ORAL | 1 refills | Status: DC

## 2018-08-09 MED ORDER — ATORVASTATIN CALCIUM 80 MG PO TABS: 80.0000 mg | ORAL_TABLET | Freq: Every day | ORAL | 1 refills | Status: DC

## 2018-08-09 MED ORDER — ASPIRIN 81 MG PO TBEC: 81.0000 mg | DELAYED_RELEASE_TABLET | Freq: Every day | ORAL | Status: AC

## 2018-08-09 MED ORDER — METOPROLOL TARTRATE 12.5 MG HALF TABLET
12.5000 mg | ORAL_TABLET | Freq: Two times a day (BID) | ORAL | Status: DC
  Administered 2018-08-09 – 2018-08-10 (×3): 12.5 mg via ORAL
  Filled 2018-08-09 (×3): qty 1

## 2018-08-09 MED ORDER — SODIUM CHLORIDE 0.9% IV SOLUTION: Freq: Once | INTRAVENOUS | Status: DC

## 2018-08-09 MED ORDER — FE FUMARATE-B12-VIT C-FA-IFC PO CAPS
1.0000 | ORAL_CAPSULE | Freq: Three times a day (TID) | ORAL | Status: DC
  Administered 2018-08-09 – 2018-08-11 (×5): 1 via ORAL
  Filled 2018-08-09 (×5): qty 1

## 2018-08-09 MED ORDER — TRAMADOL HCL 50 MG PO TABS: 50.0000 mg | ORAL_TABLET | Freq: Four times a day (QID) | ORAL | 0 refills | Status: DC | PRN

## 2018-08-09 NOTE — Progress Notes (Signed)
Started 1 unit PRBC([id number]) transfusion at 1800.Unit verified with Lydia,AD. Attempted to start transfusion at 1700 but loss IV access. RN accidentally completed transfusion in Epic; unable to re-scan bag.   0745: Oncoming nurse aware. Blood transfusing with no issues.   Lilla Shook, BSN

## 2018-08-09 NOTE — Progress Notes (Signed)
CRITICAL VALUE ALERT  Critical Value:  Hgb 6.7  Date & Time Notified:  08/09/18 1300  Provider Notified: Lars Pinks  Orders Received/Actions taken: Transfuse 1 unit PRBC, obtain CBC in am, hemoccult, DC discharge orders.  Arnell Sieving, RN, BSN

## 2018-08-09 NOTE — Progress Notes (Signed)
Blood transfusion completed @2038  unit number [id number]. Pt Ao x4, no SOB. Total volume infused 372.05. Post transfusion vitals BP 105/68, HR 96, 02 sat 100% RA. RR 20, Temp 98.2 oral. Unable to scan bag post transfusion. please see previous RN's note for blood transfusion. Pt resting in room with family. Call light within reach, will continue to monitor.

## 2018-08-09 NOTE — Progress Notes (Signed)
[facility] will sign off.   Medication Recommendations:  On asa, clopidogrel, atorvastatin and metoprolol.  No changes.  Other recommendations (labs, testing, etc):  Repeat h/h.  hgb dropped from 8.7 to 7.0 yesterday.  Follow up as an outpatient:  Scheduled with cardiology 08/24/18.  Asahel Risden C. Oval Linsey, MD, [facility] 08/09/2018 10:47 AM

## 2018-08-09 NOTE — Progress Notes (Signed)
Pt's heart rate has been up to 140's during the night and this am when he gets up or urinates. Hr goes back down as pt rests. NO s/s of SOB or chest ain with HR. Pt has been ready to go home since last night. Call light within reach. Will continue to monitor.

## 2018-08-09 NOTE — Progress Notes (Addendum)
      [address]       [place] [address]             [phone]        4 Days Post-Op Procedure(s) (LRB): CORONARY ARTERY BYPASS GRAFTING (CABG) x3. Endoscopic Saphenous vein harvest. (N/A) TRANSESOPHAGEAL ECHOCARDIOGRAM (TEE) (N/A)  Subjective: Patient sitting on edge of bed about to eat breakfast. He has no complaints and states he wants to go home.  Objective: Vital signs in last 24 hours: Temp:  [97.8 F (36.6 C)-98.5 F (36.9 C)] 98.4 F (36.9 C) (09/08 0436) Pulse Rate:  [101-148] 103 (09/08 0436) Cardiac Rhythm: Sinus tachycardia (09/08 0701) Resp:  [18-26] 20 (09/08 0436) BP: (87-129)/(52-73) 98/52 (09/08 0436) SpO2:  [92 %-100 %] 98 % (09/08 0436) Weight:  [93.7 kg-94.7 kg] 93.7 kg (09/08 0436)  Pre op weight 83.9 kg Current Weight  08/09/18 93.7 kg       Intake/Output from previous day: 09/07 0701 - 09/08 0700 In: 640 [P.O.:640] Out: 350 [Urine:350]   Physical Exam:  Cardiovascular: Tachycardic Pulmonary: Slightly diminished at bases bilaterally Abdomen: Soft, non tender, bowel sounds present. Extremities: Mild bilateral lower extremity edema. Wounds: Clean and dry.  No erythema or signs of infection.  Lab Results: CBC: Recent Labs    08/07/18 0520 08/08/18 0437  WBC 12.5* 13.3*  HGB 8.7* 7.0*  HCT 26.3* 20.9*  PLT PLATELET CLUMPS NOTED ON SMEAR, COUNT APPEARS DECREASED 115*   BMET:  Recent Labs    08/07/18 0520 08/08/18 0437  NA 137 140  K 3.7 3.8  CL 105 107  CO2 24 25  GLUCOSE 127* 113*  BUN 13 11  CREATININE 0.98 0.95  CALCIUM 8.5* 8.5*    PT/INR:  Lab Results  Component Value Date   INR 1.60 08/05/2018   INR 1.10 08/01/2018   INR 0.97 07/29/2018   ABG:  INR: Will add last result for INR, ABG once components are confirmed Will add last 4 CBG results once components are confirmed  Assessment/Plan:  1. CV - S/p NSTEMI. HR increased briefly into the 140's last evening with ambulation. Tachycardic  with HR in the 110's this am. Will restart low dose Lopressor, as discussed with Dr. Cyndia Bent. 2.  Pulmonary - On room air. Encourage incentive spirometer 3.  Acute blood loss anemia - H and H yesterday 7 and 20.9 4. Mild thrombocytopenia-platelets yesterday 115,000 5. Chest tube sutures will be removed in office after discharge 6. Discharge later today  Sharalyn Ink [facility] 08/09/2018,9:41 AM [phone]   Chart reviewed, patient examined, agree with above. I think he should stay on Lopressor 12.5 bid. BP is low normal but he tolerates it well and is ambulating without difficulty.

## 2018-08-10 ENCOUNTER — Inpatient Hospital Stay (HOSPITAL_COMMUNITY): Payer: Medicare HMO

## 2018-08-10 DIAGNOSIS — I340 Nonrheumatic mitral (valve) insufficiency: Secondary | ICD-10-CM

## 2018-08-10 LAB — CBC
HCT: 23 % — ABNORMAL LOW (ref 39.0–52.0)
HEMOGLOBIN: 7.7 g/dL — AB (ref 13.0–17.0)
MCH: 30 pg (ref 26.0–34.0)
MCHC: 33.5 g/dL (ref 30.0–36.0)
MCV: 89.5 fL (ref 78.0–100.0)
Platelets: 170 10*3/uL (ref 150–400)
RBC: 2.57 MIL/uL — AB (ref 4.22–5.81)
RDW: 14 % (ref 11.5–15.5)
WBC: 9.8 10*3/uL (ref 4.0–10.5)

## 2018-08-10 LAB — TYPE AND SCREEN
ABO/RH(D): O POS
ANTIBODY SCREEN: NEGATIVE
UNIT DIVISION: 0

## 2018-08-10 LAB — GLUCOSE, CAPILLARY
GLUCOSE-CAPILLARY: 124 mg/dL — AB (ref 70–99)
GLUCOSE-CAPILLARY: 99 mg/dL (ref 70–99)

## 2018-08-10 LAB — BPAM RBC
Blood Product Expiration Date: 201910062359
ISSUE DATE / TIME: 201909081642
Unit Type and Rh: 5100

## 2018-08-10 LAB — ECHOCARDIOGRAM LIMITED
HEIGHTINCHES: 73 in
Weight: 3288 oz

## 2018-08-10 LAB — OCCULT BLOOD X 1 CARD TO LAB, STOOL: Fecal Occult Bld: NEGATIVE

## 2018-08-10 MED ORDER — LACTULOSE 10 GM/15ML PO SOLN
20.0000 g | Freq: Once | ORAL | Status: AC
  Administered 2018-08-10: 20 g via ORAL
  Filled 2018-08-10: qty 30

## 2018-08-10 MED ORDER — METOPROLOL TARTRATE 25 MG PO TABS
25.0000 mg | ORAL_TABLET | Freq: Two times a day (BID) | ORAL | Status: DC
  Administered 2018-08-10: 25 mg via ORAL
  Filled 2018-08-10 (×2): qty 1

## 2018-08-10 NOTE — Progress Notes (Signed)
CARDIAC REHAB PHASE I   Pt up in room walking independently. Pt denies pain or SOB. Reinforced d/c ed with pt. Pt knows sternal precautions, IS use, incision monitoring, and exercise guidelines. Pt hopeful for d/c today.  [id number] Rufina Falco, RN BSN 08/10/2018 8:46 AM

## 2018-08-10 NOTE — Progress Notes (Addendum)
      [address]       [place] [address]             [phone]      5 Days Post-Op Procedure(s) (LRB): CORONARY ARTERY BYPASS GRAFTING (CABG) x3. Endoscopic Saphenous vein harvest. (N/A) TRANSESOPHAGEAL ECHOCARDIOGRAM (TEE) (N/A)   Subjective:  No new complaints.  Wants to go home.  Denies chest pain, shortness of breath.  + ambulation  Objective: Vital signs in last 24 hours: Temp:  [97.8 F (36.6 C)-99.1 F (37.3 C)] 97.8 F (36.6 C) (09/09 0354) Pulse Rate:  [94-111] 101 (09/09 0354) Cardiac Rhythm: Sinus tachycardia;Normal sinus rhythm (09/09 0425) Resp:  [20-25] 20 (09/09 0354) BP: (92-110)/(54-67) 110/64 (09/09 0354) SpO2:  [95 %-100 %] 97 % (09/09 0354) Weight:  [93.2 kg] 93.2 kg (09/09 0354)  Intake/Output from previous day: 09/08 0701 - 09/09 0700 In: 130 [P.O.:100; Blood:30] Out: 500 [Urine:500]  General appearance: alert, cooperative and no distress Heart: regular rate and rhythm and tachy Lungs: diminished breath sounds bibasilar Abdomen: soft, non-tender; bowel sounds normal; no masses,  no organomegaly Extremities: edema trace Wound: clean and dry, hematoma in right groin  Lab Results: Recent Labs    08/09/18 1234 08/10/18 0550  WBC 12.4* 9.8  HGB 6.7* 7.7*  HCT 20.5* 23.0*  PLT 127* 170   BMET:  Recent Labs    08/08/18 0437  NA 140  K 3.8  CL 107  CO2 25  GLUCOSE 113*  BUN 11  CREATININE 0.95  CALCIUM 8.5*    PT/INR: No results for input(s): LABPROT, INR in the last 72 hours. ABG    Component Value Date/Time   PHART 7.300 (L) 08/06/2018 0355   HCO3 20.1 08/06/2018 0355   TCO2 22 08/06/2018 1639   ACIDBASEDEF 6.0 (H) 08/06/2018 0355   O2SAT 99.0 08/06/2018 0355   CBG (last 3)  Recent Labs    08/07/18 1932 08/08/18 0744 08/08/18 1127  GLUCAP 126* 100* 87    Assessment/Plan: S/P Procedure(s) (LRB): CORONARY ARTERY BYPASS GRAFTING (CABG) x3. Endoscopic Saphenous vein harvest. (N/A) TRANSESOPHAGEAL  ECHOCARDIOGRAM (TEE) (N/A)  1. CV- Sinus Tach, BP remains labile- continue to hold BB due to blood pressure, continue ASA, Plavix 2. Pulm- no acute issues, off oxygen, continue IS 3. Renal- creatinine has been stable, no Lasix at this time 4. Expected post operative blood loss anemia, transfused 1 unit packed cells yesterday, hgb up to 7.7 after 1 unit, hemocult hasn't been checked yet, he does have a hematoma in his right groin, which seems localized without spread down his thigh or into his abdomen 5. Dispo- patient stable, Hgb improved some after transfusion, wants to go home today, will discuss with staff, continue iron supplementation, plan for follow up CBC with PCP in the next week   LOS: 12 days    Erin Barrett 08/10/2018  I have seen and examined the patient and agree with the assessment and plan as outlined.  Mr Coury looks good and wants to go home.  No vitals recorded since 8am this morning and he is not on telemetry.  Stool hemoccult was collected and sent to lab, results still pending.  Will keep patient overnight and recheck Hgb/Hct in the morning.  Rexene Alberts, MD 08/10/2018 5:21 PM

## 2018-08-10 NOTE — Progress Notes (Signed)
  Echocardiogram 2D Echocardiogram has been performed.  Timothy Simpson 08/10/2018, 11:25 AM

## 2018-08-11 LAB — CBC
HEMATOCRIT: 24.1 % — AB (ref 39.0–52.0)
HEMOGLOBIN: 7.8 g/dL — AB (ref 13.0–17.0)
MCH: 29.8 pg (ref 26.0–34.0)
MCHC: 32.4 g/dL (ref 30.0–36.0)
MCV: 92 fL (ref 78.0–100.0)
Platelets: 190 10*3/uL (ref 150–400)
RBC: 2.62 MIL/uL — ABNORMAL LOW (ref 4.22–5.81)
RDW: 14.6 % (ref 11.5–15.5)
WBC: 9.9 10*3/uL (ref 4.0–10.5)

## 2018-08-11 MED ORDER — POTASSIUM CHLORIDE ER 20 MEQ PO TBCR: 10.0000 meq | EXTENDED_RELEASE_TABLET | Freq: Once | ORAL | 0 refills | Status: DC

## 2018-08-11 MED ORDER — FUROSEMIDE 40 MG PO TABS: 40.0000 mg | ORAL_TABLET | Freq: Every day | ORAL | 0 refills | Status: DC

## 2018-08-11 MED ORDER — METOPROLOL TARTRATE 25 MG PO TABS: 25.0000 mg | ORAL_TABLET | Freq: Two times a day (BID) | ORAL | 3 refills | Status: DC

## 2018-08-11 MED ORDER — FERROUS SULFATE 325 (65 FE) MG PO TBEC: 325.0000 mg | DELAYED_RELEASE_TABLET | Freq: Two times a day (BID) | ORAL | 3 refills | Status: AC

## 2018-08-11 MED FILL — Nitroglycerin IV Soln 100 MCG/ML in D5W: INTRA_ARTERIAL | Qty: 10 | Status: AC
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
## 2018-08-11 NOTE — Progress Notes (Signed)
Chest tube sutures removed per order. Benzoin and steri-strips applied. Sites clean and dry. Pt waiting for ride to arrive for discharge.    Ara Kussmaul BSN, RN

## 2018-08-11 NOTE — Care Management Note (Signed)
Case Management Note Marvetta Gibbons RN, BSN Unit 4E- RN Care Coordinator  [phone]  Patient Details  Name: Timothy Simpson MRN: [id number] Date of Birth: 06/30/1946  Subjective/Objective:   Pt admitted with CAD/NSTEMI  s/p CABGx3                 Action/Plan: PTA Pt lived at home, no CM needs noted for transition home. PCP- Jani Gravel  Expected Discharge Date:  08/11/18               Expected Discharge Plan:  Home/Self Care  In-House Referral:  NA  Discharge planning Services  CM Consult  Post Acute Care Choice:  NA Choice offered to:  NA  DME Arranged:    DME Agency:     HH Arranged:    HH Agency:     Status of Service:  Completed, signed off  If discussed at [place] of Stay Meetings, dates discussed:    Discharge Disposition: home/self care   Additional Comments:  Dawayne Patricia, RN 08/11/2018, 11:49 AM

## 2018-08-11 NOTE — Progress Notes (Signed)
[id number] Brief review of ed. Encouraged walking for ex, encouraged pt to eat low carb foods, encouraged smoking cessation. Offered fake cigarette but pt declined. Plans to quit cold [place]. No other questions re ed. Graylon Good RN BSN 08/11/2018 10:34 AM

## 2018-08-23 NOTE — Progress Notes (Signed)
Cardiology Office Note:    Date:  08/24/2018   ID:  ZHAIRE LOCKER, DOB August 14, 1946, MRN [id number]  PCP:  Jani Gravel, MD  Cardiologist:  Glenetta Hew, MD , but wants to follow in [place]  Referring MD: Jani Gravel, MD   Chief Complaint  Patient presents with  . Hospitalization Follow-up    CABG    History of Present Illness:    Timothy Simpson is a 72 y.o. male with a hx of hypertension, hyperlipidemia, tobacco use, and gout.  He presented with chest pain to [facility], ED found to have elevated troponins and was transferred to [facility] for heart cath.  Heart catheterization on 07/30/2018 revealed distal left main disease with 50% stenosis followed by aneurysmal section.  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  Given his multivessel disease and left main aneurysmal area, 2 CTS was consulted.  CT surgery recommended FFR across left main and CTA of aorta.  FFR study on 08/04/2018 revealed severe stenosis of the distal left main artery with FFR of 0.78.  He ultimately underwent CABG x 3 with LIMA to dLAD and sequential SVG to medial and lateral branch of ramus intermedius.  Postoperative complications included blood loss anemia for which she received 1 unit PRBC.  He was ultimately discharged on 08/11/2018.  He presents today for hospital follow-up.  He states that he occasionally has black tarry stools.  However, he does take and iron supplement. He is doing well on aspirin and Plavix.  He continues on Lopressor 25 mg.  He was discharged on 80 mg of Lipitor.    Past Medical History:  Diagnosis Date  . Coronary artery disease   . Gout   . High cholesterol   . Hypertension   . NSTEMI (non-ST elevated myocardial infarction) ([place]) 07/29/2018  . 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 Tobacco abuse     Past Surgical History:  Procedure Laterality Date  . BACK  SURGERY    . CORONARY ARTERY BYPASS GRAFT N/A 08/05/2018   Procedure: CORONARY ARTERY BYPASS GRAFTING (CABG) x3. Endoscopic Saphenous vein harvest.;  Surgeon: Rexene Alberts, MD;  Location: [place];  Service: Open Heart Surgery;  Laterality: N/A;  . INTRAVASCULAR PRESSURE WIRE/FFR STUDY N/A 08/04/2018   Procedure: INTRAVASCULAR PRESSURE WIRE/FFR STUDY;  Surgeon: Burnell Blanks, MD;  Location: [place] [facility];  Service: Cardiovascular;  Laterality: N/A;  . LEFT HEART CATH AND CORONARY ANGIOGRAPHY N/A 07/30/2018   Procedure: LEFT HEART CATH AND CORONARY ANGIOGRAPHY;  Surgeon: Jettie Booze, MD;  Location: [place] [facility];  Service: Cardiovascular;  Laterality: N/A;  . TEE WITHOUT CARDIOVERSION N/A 08/05/2018   Procedure: TRANSESOPHAGEAL ECHOCARDIOGRAM (TEE);  Surgeon: Rexene Alberts, MD;  Location: [facility];  Service: Open Heart Surgery;  Laterality: N/A;    Current Medications: Current Meds  Medication Sig  . allopurinol (ZYLOPRIM) 100 MG tablet Take 100 mg by mouth daily.  Marland Kitchen aspirin EC 81 MG EC tablet Take 1 tablet (81 mg total) by mouth daily.  Marland Kitchen atorvastatin (LIPITOR) 80 MG tablet Take 1 tablet (80 mg total) by mouth daily at 6 PM.  . clopidogrel (PLAVIX) 75 MG tablet Take 1 tablet (75 mg total) by mouth daily.  . ferrous sulfate 325 (65 FE) MG EC tablet Take 1 tablet (325 mg total) by mouth 2 (two) times  daily.  . metoprolol tartrate (LOPRESSOR) 25 MG tablet Take 1 tablet (25 mg total) by mouth 2 (two) times daily.  . traMADol (ULTRAM) 50 MG tablet Take 1 tablet (50 mg total) by mouth every 6 (six) hours as needed for severe pain.     Allergies:   Patient has no known allergies.   Social History   Socioeconomic History  . Marital status: Married    Spouse name: Not on file  . Number of children: Not on file  . Years of education: Not on file  . Highest education level: Not on file  Occupational History  . Not on file  Social Needs  . Financial resource strain: Not  on file  . Food insecurity:    Worry: Not on file    Inability: Not on file  . Transportation needs:    Medical: Not on file    Non-medical: Not on file  Tobacco Use  . Smoking status: Current Every Day Smoker    Packs/day: 1.00    Types: Cigarettes  . Smokeless tobacco: Never Used  Substance and Sexual Activity  . Alcohol use: No  . Drug use: No  . Sexual activity: Not on file  Lifestyle  . Physical activity:    Days per week: Not on file    Minutes per session: Not on file  . Stress: Not on file  Relationships  . Social connections:    Talks on phone: Not on file    Gets together: Not on file    Attends religious service: Not on file    Active member of club or organization: Not on file    Attends meetings of clubs or organizations: Not on file    Relationship status: Not on file  Other Topics Concern  . Not on file  Social History Narrative  . Not on file     Family History: The patient's family history includes Hypertension in his father.  ROS:   Please see the history of present illness.     All other systems reviewed and are negative.  EKGs/Labs/Other Studies Reviewed:    The following studies were reviewed today:  Echo 07/29/18: Study Conclusions - Left ventricle: The cavity size was normal. There was mild   concentric hypertrophy. Systolic function was normal. The   estimated ejection fraction was in the range of 60% to 65%. Wall   motion was normal; there were no regional wall motion   abnormalities. There was an increased relative contribution of   atrial contraction to ventricular filling. Doppler parameters are   consistent with abnormal left ventricular relaxation (grade 1   diastolic dysfunction). - Mitral valve: There was mild regurgitation. - Atrial septum: There was increased thickness of the septum,   consistent with lipomatous hypertrophy. - Tricuspid valve: There was mild regurgitation.   Left heart cath 07/30/18:  Mid LM to Dist LM  lesion is 50% stenosed. 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  Ost Ramus lesion is 95% stenosed.  Lat Ramus lesion is 99% stenosed.  Dist RCA lesion is 100% stenosed.  Mid Cx lesion is 70% stenosed.  Prox LAD lesion is 60% stenosed.  The left ventricular systolic function is normal.  LV end diastolic pressure is normal. LVEDP 6 mm Hg.  The left ventricular ejection fraction is 55-65% by visual estimate.  There is no aortic valve stenosis.   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  Culprit  lesion is likely the branch of the OM with TIMI 2 flow.  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    FFR 08/04/18: 1. Severe stenosis in the distal left main artery involving an aneurysmal segment.  2. Fractional flow reserve measurement of the left main artery suggest flow limitation (FFR 0.78 with infusion of adenosine).  3. As noted in prior cath, there is also severe stenosis in the intermediate branch.   Recommendations: Would proceed with CABG given significance of stenosis in the distal left main artery which is complicated by an aneurysmal segment.    EKG:  EKG is ordered today.  The ekg ordered today demonstrates sinus with ectopy  Recent Labs: 08/01/2018: ALT 13 08/06/2018: Magnesium 2.2 08/08/2018: BUN 11; Creatinine, Ser 0.95; Potassium 3.8; Sodium 140 08/11/2018: Hemoglobin 7.8; Platelets 190  Recent Lipid Panel    Component Value Date/Time   CHOL 166 07/30/2018 0355   TRIG 100 07/30/2018 0355   HDL 37 (L) 07/30/2018 0355   CHOLHDL 4.5 07/30/2018 0355   VLDL 20 07/30/2018 0355   LDLCALC 109 (H) 07/30/2018 0355    Physical Exam:    VS:  BP 109/65   Pulse 88   Ht 6\' 1"  (1.854 m)   Wt 193 lb (87.5 kg)   BMI 25.46 kg/m     Wt Readings from Last 3 Encounters:  08/24/18 193 lb (87.5 kg)  08/11/18 204 lb 2.3 oz (92.6 kg)  06/26/18 210 lb (95.3 kg)      GEN: Well nourished, well developed in no acute distress HEENT: Normal NECK: No JVD; + right carotid bruit LYMPHATICS: No lymphadenopathy CARDIAC: RRR, no murmurs, rubs, gallops RESPIRATORY:  Clear to auscultation without rales, wheezing or rhonchi  ABDOMEN: Soft, non-tender, non-distended MUSCULOSKELETAL:  No edema; No deformity  SKIN: Warm and dry NEUROLOGIC:  Alert and oriented x 3 PSYCHIATRIC:  Normal affect   ASSESSMENT:    1. NSTEMI (non-ST elevated myocardial infarction) ([place])   2. Essential hypertension   3. Pure hypercholesterolemia   4. Bruit of right carotid artery   5. Chronic diastolic heart failure (HCC)    PLAN:    In order of problems listed above:  NSTEMI (non-ST elevated myocardial infarction) ([place]) - Plan: EKG 12-Lead, CBC CAD Patient is doing well status post CABG.  He is maintained on 81 mg of aspirin and Plavix.  He does report intermittent black tarry stools, but he is also taking an iron supplement.  I will collect a CBC today.   Essential hypertension - Plan: EKG 12-Lead, CBC Pressure is well controlled today.  No medication changes.   Pure hypercholesterolemia - Plan: Lipid panel Continue 80 mg of Lipitor.  We will check fasting lipids in 6 weeks.   Bruit of right carotid artery - Plan: US Carotid Duplex Bilateral Carotid bruit heard on exam. Will check a duplex.    Chronic diastolic heart failure ([place]) He is euvolemic on exam today.  I encouraged daily weights.   He would like to follow up in [place] in 3 months.    Medication Adjustments/Labs and Tests Ordered: Current medicines are reviewed at length with the patient today.  Concerns regarding medicines are outlined above.  Orders Placed This Encounter  Procedures  . US Carotid Duplex Bilateral  . CBC  . Lipid panel  . EKG 12-Lead   No orders of the defined types were placed in this encounter.   Signed, Ledora Bottcher, [place]  08/24/2018  3:44 PM    [facility]

## 2018-08-24 ENCOUNTER — Ambulatory Visit: Payer: Medicare HMO | Admitting: Physician Assistant

## 2018-08-24 ENCOUNTER — Encounter: Payer: Self-pay | Admitting: Physician Assistant

## 2018-08-24 VITALS — BP 109/65 | HR 88 | Ht 73.0 in | Wt 193.0 lb

## 2018-08-24 DIAGNOSIS — I10 Essential (primary) hypertension: Secondary | ICD-10-CM

## 2018-08-24 DIAGNOSIS — I214 Non-ST elevation (NSTEMI) myocardial infarction: Secondary | ICD-10-CM

## 2018-08-24 DIAGNOSIS — E7800 Pure hypercholesterolemia, unspecified: Secondary | ICD-10-CM

## 2018-08-24 DIAGNOSIS — I5032 Chronic diastolic (congestive) heart failure: Secondary | ICD-10-CM

## 2018-08-24 DIAGNOSIS — R0989 Other specified symptoms and signs involving the circulatory and respiratory systems: Secondary | ICD-10-CM

## 2018-08-24 NOTE — Patient Instructions (Signed)
Medication Instructions:  Continue current medications  If you need a refill on your cardiac medications before your next appointment, please call your pharmacy.  Labwork: CBC Today and Fasting Lipids in 6 weeks HERE IN OUR OFFICE AT [facility]  Take the provided lab slips with you to the lab for your blood draw.   You will need to fast. DO NOT EAT OR DRINK PAST MIDNIGHT.   You will NOT need to fast   Testing/Procedures: Your physician has requested that you have a carotid duplex in [place]. This test is an ultrasound of the carotid arteries in your neck. It looks at blood flow through these arteries that supply the brain with blood. Allow one hour for this exam. There are no restrictions or special instructions.   Follow-Up: Your physician wants you to follow-up in: 3 Months in [place].       Thank you for choosing [facility] at [facility]!!

## 2018-08-25 LAB — CBC
HEMATOCRIT: 32.4 % — AB (ref 37.5–51.0)
HEMOGLOBIN: 10.2 g/dL — AB (ref 13.0–17.7)
MCH: 27.9 pg (ref 26.6–33.0)
MCHC: 31.5 g/dL (ref 31.5–35.7)
MCV: 89 fL (ref 79–97)
Platelets: 376 10*3/uL (ref 150–450)
RBC: 3.66 x10E6/uL — ABNORMAL LOW (ref 4.14–5.80)
RDW: 14.3 % (ref 12.3–15.4)
WBC: 6.1 10*3/uL (ref 3.4–10.8)

## 2018-08-26 ENCOUNTER — Telehealth: Payer: Self-pay

## 2018-08-26 NOTE — Telephone Encounter (Signed)
Called patient but he was unavailable. Unablwe to speak with wife due to her not being on DPR. Advised her to have patient contact office. She voiced understanding.

## 2018-08-26 NOTE — Telephone Encounter (Signed)
-----   Message from Ledora Bottcher, [place] sent at 08/25/2018  4:03 PM EDT ----- Regarding: RE: patient question I told him that his driving restriction had to be lifted by CT surgery. He can call them.    ----- Message ----- From: Harold Hedge, CMA Sent: 08/25/2018   2:09 PM EDT To: Tami Lin Duke, PA Subject: patient question                               Patient wants to know if he can drive.  Thanks,  [employer]

## 2018-08-31 ENCOUNTER — Ambulatory Visit (HOSPITAL_COMMUNITY)
Admission: RE | Admit: 2018-08-31 | Discharge: 2018-08-31 | Disposition: A | Discharge status: Home / Self Care | Payer: Medicare HMO | Source: Ambulatory Visit | Attending: Physician Assistant | Admitting: Physician Assistant

## 2018-08-31 DIAGNOSIS — R0989 Other specified symptoms and signs involving the circulatory and respiratory systems: Secondary | ICD-10-CM

## 2018-08-31 DIAGNOSIS — I6523 Occlusion and stenosis of bilateral carotid arteries: Secondary | ICD-10-CM | POA: Insufficient documentation

## 2018-08-31 NOTE — Telephone Encounter (Signed)
Patient notified directly and voiced understanding.  

## 2018-09-07 ENCOUNTER — Other Ambulatory Visit: Payer: Self-pay | Admitting: Physician Assistant

## 2018-09-11 ENCOUNTER — Other Ambulatory Visit: Payer: Self-pay | Admitting: Thoracic Surgery (Cardiothoracic Vascular Surgery)

## 2018-09-11 DIAGNOSIS — Z951 Presence of aortocoronary bypass graft: Secondary | ICD-10-CM

## 2018-09-14 ENCOUNTER — Ambulatory Visit (INDEPENDENT_AMBULATORY_CARE_PROVIDER_SITE_OTHER): Payer: Self-pay | Admitting: Thoracic Surgery (Cardiothoracic Vascular Surgery)

## 2018-09-14 ENCOUNTER — Encounter: Payer: Self-pay | Admitting: Thoracic Surgery (Cardiothoracic Vascular Surgery)

## 2018-09-14 ENCOUNTER — Ambulatory Visit
Admission: RE | Admit: 2018-09-14 | Discharge: 2018-09-14 | Disposition: A | Discharge status: Home / Self Care | Payer: Medicare HMO | Source: Ambulatory Visit | Attending: Thoracic Surgery (Cardiothoracic Vascular Surgery) | Admitting: Thoracic Surgery (Cardiothoracic Vascular Surgery)

## 2018-09-14 VITALS — BP 117/80 | HR 85 | Resp 20 | Ht 73.0 in | Wt 199.0 lb

## 2018-09-14 DIAGNOSIS — Z951 Presence of aortocoronary bypass graft: Secondary | ICD-10-CM

## 2018-09-14 DIAGNOSIS — I2510 Atherosclerotic heart disease of native coronary artery without angina pectoris: Secondary | ICD-10-CM

## 2018-09-14 NOTE — Addendum Note (Signed)
Addended by: Charlena Cross F on: 09/14/2018 03:23 PM   Modules accepted: Orders

## 2018-09-14 NOTE — Patient Instructions (Signed)
Continue all previous medications without any changes at this time  Stop smoking immediately and permanently.  You are encouraged to enroll and participate in the outpatient cardiac rehab program beginning as soon as practical.  You may return to driving an automobile as long as you are no longer requiring oral narcotic pain relievers during the daytime.  It would be wise to start driving only short distances during the daylight and gradually increase from there as you feel comfortable.  Continue to avoid any heavy lifting or strenuous use of your arms or shoulders for at least a total of three months from the time of surgery.  After three months you may gradually increase how much you lift or otherwise use your arms or chest as tolerated, with limits based upon whether or not activities lead to the return of significant discomfort.

## 2018-09-14 NOTE — Progress Notes (Addendum)
[address]       [place] [address]             [phone]     CARDIOTHORACIC SURGERY OFFICE NOTE  Referring Provider is Jettie Booze, MD  Primary Cardiologist is Glenetta Hew, MD (new) PCP is Jani Gravel, MD   HPI:  Patient is a 72 year old African-American male with history of hypertension, hyperlipidemia, and long-standing tobacco abuse who returns to the office today for routine follow-up status post coronary artery bypass grafting x3 on August 05, 2018 for severe left main coronary artery disease status post acute non-ST segment elevation myocardial infarction.  The patient's early postoperative recovery was uneventful and he was discharged home on the fifth postoperative day.  He was seen in follow-up by Fabian Sharp at [facility] on August 24, 2018 and returns to our office today for routine follow-up.  He is accompanied by his son and reports that he is doing very well.  He no longer has any significant pain in his chest and he has not been using any sort of pain relievers.  His son reports that he has gone back to smoking cigarettes.  He denies any shortness of breath.  Appetite is good.  He is sleeping at night.  He hopes to enroll in the cardiac rehab program at [facility].  He is anxious to return to driving an automobile.  Overall he reports no problems or complaints.  Current Outpatient Medications  Medication Sig Dispense Refill  . allopurinol (ZYLOPRIM) 100 MG tablet Take 100 mg by mouth daily.    Marland Kitchen aspirin EC 81 MG EC tablet Take 1 tablet (81 mg total) by mouth daily.    Marland Kitchen atorvastatin (LIPITOR) 80 MG tablet Take 1 tablet (80 mg total) by mouth daily at 6 PM. 30 tablet 1  . clopidogrel (PLAVIX) 75 MG tablet Take 1 tablet (75 mg total) by mouth daily. 30 tablet 1  . ferrous sulfate 325 (65 FE) MG EC tablet Take 1 tablet (325 mg total) by mouth 2 (two) times daily. 60 tablet 3  . metoprolol tartrate (LOPRESSOR) 25 MG tablet  Take 1 tablet (25 mg total) by mouth 2 (two) times daily. 60 tablet 3  . traMADol (ULTRAM) 50 MG tablet Take 1 tablet (50 mg total) by mouth every 6 (six) hours as needed for severe pain. 15 tablet 0   No current facility-administered medications for this visit.       Physical Exam:   BP 117/80   Pulse 85   Resp 20   Ht 6\' 1"  (1.854 m)   Wt 199 lb (90.3 kg)   SpO2 95% Comment: RA  BMI 26.25 kg/m   General:  Well-appearing  Chest:   Clear to auscultation  CV:   Regular rate and rhythm without murmur  Incisions:  Healing nicely, small suture is removed from the midsternal incision  Abdomen:  Soft nontender  Extremities:  Warm and well-perfused, no edema  Diagnostic Tests:  CHEST - 2 VIEW  COMPARISON:  Prior radiograph from 08/08/2018.  FINDINGS: Median sternotomy wires underlying CABG markers, stable. Mild cardiomegaly is unchanged. Mediastinal silhouette within normal limits.  Lungs are hypoinflated. Small layering left pleural effusion with associated left basilar atelectasis, increased from previous. No other consolidative airspace opacity. No pulmonary edema. Negative for pneumothorax.  Osseous structures within normal limits.  IMPRESSION: 1. Small left pleural effusion with associated left basilar atelectasis, increased from previous. No pulmonary edema.  2. Sequelae of prior CABG.   Electronically Signed   By: Jeannine Boga M.D.   On: 09/14/2018 13:40    Impression:  Patient is doing very well approximately 1 month status post coronary artery bypass grafting  Plan:  We have not recommended any change the patient's current medications.  I have encouraged the patient to continue to gradually increase his physical activity but I have reminded him to refrain from any sort of heavy lifting or strenuous use of his arms or shoulders for at least another 2 months.  I think he may resume driving an automobile and I have encouraged him to enroll in  the cardiac rehab program.  Patient will return to our office for routine follow-up next September, approximately 1 year following his surgery.  He will call and return sooner should specific problems or questions arise.    [name], MD 09/14/2018 2:02 PM

## 2018-09-27 ENCOUNTER — Emergency Department (HOSPITAL_COMMUNITY)
Admission: EM | Admit: 2018-09-27 | Discharge: 2018-09-27 | Disposition: A | Discharge status: Home Health | Payer: Medicare HMO | Attending: Emergency Medicine | Admitting: Emergency Medicine

## 2018-09-27 ENCOUNTER — Emergency Department (HOSPITAL_COMMUNITY): Payer: Medicare HMO

## 2018-09-27 ENCOUNTER — Encounter (HOSPITAL_COMMUNITY): Payer: Self-pay | Admitting: Emergency Medicine

## 2018-09-27 ENCOUNTER — Other Ambulatory Visit: Payer: Self-pay

## 2018-09-27 DIAGNOSIS — F17210 Nicotine dependence, cigarettes, uncomplicated: Secondary | ICD-10-CM | POA: Diagnosis not present

## 2018-09-27 DIAGNOSIS — M25511 Pain in right shoulder: Secondary | ICD-10-CM | POA: Diagnosis present

## 2018-09-27 DIAGNOSIS — I10 Essential (primary) hypertension: Secondary | ICD-10-CM | POA: Insufficient documentation

## 2018-09-27 DIAGNOSIS — Z951 Presence of aortocoronary bypass graft: Secondary | ICD-10-CM | POA: Diagnosis not present

## 2018-09-27 DIAGNOSIS — Z79899 Other long term (current) drug therapy: Secondary | ICD-10-CM | POA: Diagnosis not present

## 2018-09-27 DIAGNOSIS — I2510 Atherosclerotic heart disease of native coronary artery without angina pectoris: Secondary | ICD-10-CM | POA: Diagnosis not present

## 2018-09-27 DIAGNOSIS — Z7982 Long term (current) use of aspirin: Secondary | ICD-10-CM | POA: Insufficient documentation

## 2018-09-27 MED ORDER — NAPROXEN 375 MG PO TABS: 375.0000 mg | ORAL_TABLET | Freq: Two times a day (BID) | ORAL | 0 refills | Status: DC

## 2018-09-27 MED ORDER — KETOROLAC TROMETHAMINE 30 MG/ML IJ SOLN
30.0000 mg | Freq: Once | INTRAMUSCULAR | Status: AC
  Administered 2018-09-27: 30 mg via INTRAMUSCULAR
  Filled 2018-09-27: qty 1

## 2018-09-27 MED ORDER — TRAMADOL HCL 50 MG PO TABS: 50.0000 mg | ORAL_TABLET | Freq: Four times a day (QID) | ORAL | 0 refills | Status: AC | PRN

## 2018-09-27 NOTE — ED Provider Notes (Signed)
Medical screening examination/treatment/procedure(s) were conducted as a shared visit with non-physician practitioner(s) and myself.  I personally evaluated the patient during the encounter.  None   Results for orders placed or performed in visit on 08/24/18  CBC  Result Value Ref Range   WBC 6.1 3.4 - 10.8 x10E3/uL   RBC 3.66 (L) 4.14 - 5.80 x10E6/uL   Hemoglobin 10.2 (L) 13.0 - 17.7 g/dL   Hematocrit 32.4 (L) 37.5 - 51.0 %   MCV 89 79 - 97 fL   MCH 27.9 26.6 - 33.0 pg   MCHC 31.5 31.5 - 35.7 g/dL   RDW 14.3 12.3 - 15.4 %   Platelets 376 150 - 450 x10E3/uL   Dg Chest 2 View  Result Date: 09/14/2018 CLINICAL DATA:  Follow-up examination status post CABG. EXAM: CHEST - 2 VIEW COMPARISON:  Prior radiograph from 08/08/2018. FINDINGS: Median sternotomy wires underlying CABG markers, stable. Mild cardiomegaly is unchanged. Mediastinal silhouette within normal limits. Lungs are hypoinflated. Small layering left pleural effusion with associated left basilar atelectasis, increased from previous. No other consolidative airspace opacity. No pulmonary edema. Negative for pneumothorax. Osseous structures within normal limits. IMPRESSION: 1. Small left pleural effusion with associated left basilar atelectasis, increased from previous. No pulmonary edema. 2. Sequelae of prior CABG. Electronically Signed   By: Jeannine Boga M.D.   On: 09/14/2018 13:40   Dg Shoulder Right  Result Date: 09/27/2018 CLINICAL DATA:  72 year old male status post blunt trauma to the right shoulder yesterday with anterior pain. EXAM: RIGHT SHOULDER - 2+ VIEW COMPARISON:  Chest radiographs 09/14/2018 and earlier. FINDINGS: Button artifact on the AP view. No glenohumeral joint dislocation. Proximal right humerus appears intact. Chronic degenerative changes at the right [facility] joint. No acute osseous abnormality identified. Stable visible chest. IMPRESSION: No acute fracture or dislocation identified about the right shoulder.  Electronically Signed   By: Genevie Ann M.D.   On: 09/27/2018 10:59   US Carotid Duplex Bilateral  Result Date: 08/31/2018 CLINICAL DATA:  Asymptomatic right carotid bruit EXAM: BILATERAL CAROTID DUPLEX ULTRASOUND TECHNIQUE: Pearline Cables scale imaging, color Doppler and duplex ultrasound were performed of bilateral carotid and vertebral arteries in the neck. COMPARISON:  None. FINDINGS: Criteria: Quantification of carotid stenosis is based on velocity parameters that correlate the residual internal carotid diameter with NASCET-based stenosis levels, using the diameter of the distal internal carotid lumen as the denominator for stenosis measurement. The following velocity measurements were obtained: RIGHT ICA: 397/134 cm/sec CCA: 96/22 cm/sec SYSTOLIC ICA/CCA RATIO:  5.3 ECA: 139 cm/sec LEFT ICA: 67/27 cm/sec CCA: 29/79 cm/sec SYSTOLIC ICA/CCA RATIO:  0.6 ECA: 106 cm/sec RIGHT CAROTID ARTERY: Heterogeneous mixed echogenicity right carotid bifurcation atherosclerosis. This narrows the proximal right ICA lumen by grayscale imaging. In this region there is marked velocity elevation measuring 397/134 centimeters/second with turbulent flow and spectral broadening. Right ICA stenosis estimated greater than 70% by ultrasound criteria. RIGHT VERTEBRAL ARTERY:  Antegrade LEFT CAROTID ARTERY: Mild heterogeneous partially calcified left carotid bifurcation atherosclerosis. Despite this, there is no hemodynamically significant left ICA stenosis, velocity elevation, or turbulent flow. Degree of narrowing estimated less than 50% by ultrasound criteria. LEFT VERTEBRAL ARTERY:  Antegrade IMPRESSION: Right greater than left carotid atherosclerosis. Right ICA stenosis estimated greater than 70% by ultrasound criteria Left ICA narrowing less than 50%. Electronically Signed   By: Jerilynn Mages.  Shick M.D.   On: 08/31/2018 14:08   Patient seen by me.  Patient with injury to his right shoulder yesterday.  X-rays are negative.  Neurovascular intact  distally.  Reasonable range of motion.  Pain is kind of on the upper proximal part of the shoulder.  X-ray negative for any bony injury or dislocation.  Patient had a son fall on the top of the shoulder when they were doing some tractor work yesterday.  Patient does not have an orthopedic doctor.  Patient presented mostly for pain control.  Tramadol would be appropriate for pain.  Will be given information to follow-up with orthopedics if he does not improve.   Fredia Sorrow, MD 09/27/18 1205

## 2018-09-27 NOTE — ED Provider Notes (Signed)
[facility] EMERGENCY DEPARTMENT Provider Note   CSN: [id number] Arrival date & time: 09/27/18  2119     History   Chief Complaint Chief Complaint  Patient presents with  . Shoulder Pain    HPI Timothy Simpson is a 72 y.o. male.  72 y.o male with a PMH of CAD, HTN, gout presents to the ED with a chief complaint of right shoulder pain x 1 days. Patient reports him and his son were working with the tractor yesterday when his son fell of the tractor and landed on his right shoulder. He reports the pain is of throbbing nature worse with movement of his arm.He has not tried any therapy for his symptoms. He denies any fever, chest pain or shortness of breath.      Past Medical History:  Diagnosis Date  . Coronary artery disease   . Gout   . High cholesterol   . Hypertension   . NSTEMI (non-ST elevated myocardial infarction) ([place]) 07/29/2018  . 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 Tobacco abuse     Patient Active Problem List   Diagnosis Date Noted  . 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 Coronary artery disease   . Essential hypertension   . Hyperlipidemia   . Gout   . Tobacco abuse   . NSTEMI (non-ST elevated myocardial infarction) ([place]) 07/29/2018    Past Surgical History:  Procedure Laterality Date  . BACK SURGERY    . CORONARY ARTERY BYPASS GRAFT N/A 08/05/2018   Procedure: CORONARY ARTERY BYPASS GRAFTING (CABG) x3. Endoscopic Saphenous vein harvest.;  Surgeon: Rexene Alberts, MD;  Location: [place];  Service: Open Heart Surgery;  Laterality: N/A;  . INTRAVASCULAR PRESSURE WIRE/FFR STUDY N/A 08/04/2018   Procedure: INTRAVASCULAR PRESSURE WIRE/FFR STUDY;  Surgeon: [place] Blanks, MD;  Location: [place] [facility];  Service: Cardiovascular;  Laterality: N/A;  . LEFT HEART CATH AND CORONARY ANGIOGRAPHY N/A 07/30/2018   Procedure: LEFT HEART CATH AND CORONARY  ANGIOGRAPHY;  Surgeon: Jettie Booze, MD;  Location: [place] [facility];  Service: Cardiovascular;  Laterality: N/A;  . TEE WITHOUT CARDIOVERSION N/A 08/05/2018   Procedure: TRANSESOPHAGEAL ECHOCARDIOGRAM (TEE);  Surgeon: Rexene Alberts, MD;  Location: [place];  Service: Open Heart Surgery;  Laterality: N/A;        Home Medications    Prior to Admission medications   Medication Sig Start Date End Date Taking? Authorizing Provider  allopurinol (ZYLOPRIM) 100 MG tablet Take 100 mg by mouth daily. 04/28/18   [provider]  aspirin EC 81 MG EC tablet Take 1 tablet (81 mg total) by mouth daily. 08/09/18   Nani Skillern, PA-C  atorvastatin (LIPITOR) 80 MG tablet Take 1 tablet (80 mg total) by mouth daily at 6 PM. 08/09/18   Nani Skillern, PA-C  clopidogrel (PLAVIX) 75 MG tablet Take 1 tablet (75 mg total) by mouth daily. 08/09/18   Nani Skillern, PA-C  ferrous sulfate 325 (65 FE) MG EC tablet Take 1 tablet (325 mg total) by mouth 2 (two) times daily. 08/11/18 08/11/19  Barrett, Erin R, PA-C  metoprolol tartrate (LOPRESSOR) 25 MG tablet Take 1 tablet (25 mg total) by mouth 2 (two) times daily. 08/11/18   Barrett, Erin R, PA-C  traMADol (ULTRAM) 50 MG tablet Take 1 tablet (50 mg total) by mouth every 6 (six) hours as  needed for up to 3 days. 09/27/18 09/30/18  Janeece Fitting, PA-C    Family History Family History  Problem Relation Age of Onset  . Hypertension Father     Social History Social History   Tobacco Use  . Smoking status: Current Every Day Smoker    Packs/day: 0.50    Types: Cigarettes  . Smokeless tobacco: Never Used  Substance Use Topics  . Alcohol use: No  . Drug use: No     Allergies   Patient has no known allergies.   Review of Systems Review of Systems  Constitutional: Negative for fever.  Musculoskeletal: Positive for arthralgias.     Physical Exam Updated Vital Signs BP (!) 148/88 (BP Location: Right Arm)   Pulse 71   Temp  98 F (36.7 C) (Oral)   Resp 17   Ht 6\' 1"  (1.854 m)   Wt 90.7 kg   SpO2 98%   BMI 26.39 kg/m   Physical Exam  Constitutional: He is oriented to person, place, and time. He appears well-developed and well-nourished.  HENT:  Head: Normocephalic and atraumatic.  Neck: Normal range of motion. Neck supple.  Cardiovascular: Normal heart sounds.  Pulmonary/Chest: Breath sounds normal.  Abdominal: Soft.  Musculoskeletal: He exhibits tenderness.       Right shoulder: He exhibits tenderness and pain. He exhibits no swelling, no effusion and no crepitus.       Left shoulder: He exhibits normal range of motion, no tenderness, no bony tenderness, no swelling, no effusion, no crepitus, no pain and no spasm.       Arms: Patient is worse with arm extension past 90 degrees and behind the head reaching. Full ROM.  Neurological: He is alert and oriented to person, place, and time.  Skin: Skin is warm and dry.  Nursing note and vitals reviewed.    ED Treatments / Results  Labs (all labs ordered are listed, but only abnormal results are displayed) Labs Reviewed - No data to display  EKG None  Radiology Dg Shoulder Right  Result Date: 09/27/2018 CLINICAL DATA:  72 year old male status post blunt trauma to the right shoulder yesterday with anterior pain. EXAM: RIGHT SHOULDER - 2+ VIEW COMPARISON:  Chest radiographs 09/14/2018 and earlier. FINDINGS: Button artifact on the AP view. No glenohumeral joint dislocation. Proximal right humerus appears intact. Chronic degenerative changes at the right [facility] joint. No acute osseous abnormality identified. Stable visible chest. IMPRESSION: No acute fracture or dislocation identified about the right shoulder. Electronically Signed   By: Genevie Ann M.D.   On: 09/27/2018 10:59    Procedures Procedures (including critical care time)  Medications Ordered in ED Medications  ketorolac (TORADOL) 30 MG/ML injection 30 mg (has no administration in time range)      Initial Impression / Assessment and Plan / ED Course  I have reviewed the triage vital signs and the nursing notes.  Pertinent labs & imaging results that were available during my care of the patient were reviewed by me and considered in my medical decision making (see chart for details).    Presents with right shoulder pain after his son fell on top of him while lifting tractors yesterday at work.  Patient has not tried any therapy for his pain or relief.  During examination there is pain with full extension of arm above his head along with behind head over reach.  I have discussed this patient with Dr. Shayne Alken who will also evaluate and see patient.  DG right shoulder  was therefore dislocation, fracture, soft tissue swelling.  At this time I will discharge patient tramadol for pain along with a referral to Dr. Arther Abbott to further evaluate his shoulder pain.  Final Clinical Impressions(s) / ED Diagnoses   Final diagnoses:  Acute pain of right shoulder    ED Discharge Orders         Ordered    naproxen (NAPROSYN) 375 MG tablet  2 times daily,   Status:  Discontinued     09/27/18 1130    traMADol (ULTRAM) 50 MG tablet  Every 6 hours PRN     09/27/18 1134           Janeece Fitting, PA-C 10/06/18 1538    Fredia Sorrow, MD 10/07/18 [phone]

## 2018-09-27 NOTE — Discharge Instructions (Addendum)
The x-ray of your shoulder today was normal. I have prescribed medication to help with your pain.Please take 1 tablet twice a day for up to 7 days or until your symptoms resolve. You may also apply heat to this area for relieve. I have provided a referral for an orthopedist Dr. Aline Brochure please schedule an appointment with him as needed.

## 2018-09-27 NOTE — ED Triage Notes (Signed)
PT c/o right shoulder pain with ROM that started yesterday. PT states his son landed onto his right shoulder while outside doing yard work with a tractor yesterday.

## 2018-10-09 ENCOUNTER — Other Ambulatory Visit: Payer: Self-pay | Admitting: Physician Assistant

## 2018-10-21 ENCOUNTER — Encounter: Payer: Self-pay | Admitting: Internal Medicine

## 2018-10-21 ENCOUNTER — Encounter (HOSPITAL_COMMUNITY): Payer: Medicare HMO

## 2018-11-04 ENCOUNTER — Encounter (HOSPITAL_COMMUNITY): Payer: Self-pay

## 2018-11-04 ENCOUNTER — Encounter (HOSPITAL_COMMUNITY)
Admission: RE | Admit: 2018-11-04 | Discharge: 2018-11-04 | Disposition: A | Discharge status: Home / Self Care | Payer: Medicare HMO | Source: Ambulatory Visit | Attending: Thoracic Surgery (Cardiothoracic Vascular Surgery) | Admitting: Thoracic Surgery (Cardiothoracic Vascular Surgery)

## 2018-11-04 ENCOUNTER — Encounter (HOSPITAL_COMMUNITY): Payer: Medicare HMO

## 2018-11-04 VITALS — BP 148/78 | Ht 73.0 in | Wt 193.5 lb

## 2018-11-04 DIAGNOSIS — I214 Non-ST elevation (NSTEMI) myocardial infarction: Secondary | ICD-10-CM

## 2018-11-04 DIAGNOSIS — Z951 Presence of aortocoronary bypass graft: Secondary | ICD-10-CM | POA: Diagnosis present

## 2018-11-04 NOTE — Progress Notes (Signed)
Daily Session Note  Patient Details  Name: QUASEAN FRYE MRN: [id number] Date of Birth: August 17, 1946 Referring Provider:    Encounter Date: 11/04/2018  Check In: Session Check In - 11/04/18 0815      Check-In   Supervising physician immediately available to respond to emergencies  See telemetry face sheet for immediately available MD    Location  [facility]    Staff Present  Russella Dar, MS, EP, [facility], Exercise Physiologist;Debra Wynetta Emery, RN, Cory Munch, Exercise Physiologist    Medication changes reported      No    Fall or balance concerns reported     No    Tobacco Cessation  Use Decreased    Warm-up and Cool-down  Performed as group-led instruction    Resistance Training Performed  Yes    VAD Patient?  No    PAD/SET Patient?  No      Pain Assessment   Currently in Pain?  No/denies    Multiple Pain Sites  No       Capillary Blood Glucose: No results found for this or any previous visit (from the past 24 hour(s)).    Social History   Tobacco Use  Smoking Status Current Every Day Smoker  . Packs/day: 0.50  . Types: Cigarettes  Smokeless Tobacco Never Used    Goals Met:  Independence with exercise equipment Exercise tolerated well Personal goals reviewed No report of cardiac concerns or symptoms Strength training completed today  Goals Unmet:  Not Applicable  Comments: Check out: 10:30   Dr. Kate Sable is Medical Director for [place] and Pulmonary Rehab.

## 2018-11-04 NOTE — Progress Notes (Signed)
Cardiac/Pulmonary Rehab Medication Review by a Pharmacist  Does the patient  feel that his/her medications are working for him/her?  yes  Has the patient been experiencing any side effects to the medications prescribed?  no  Does the patient measure his/her own blood pressure or blood glucose at home?  yes   Does the patient have any problems obtaining medications due to transportation or finances?   no  Understanding of regimen: good Understanding of indications: fair Potential of compliance: good  Questions asked to Determine Patient Understanding of Medication Regimen:  1. What is the name of the medication?  2. What is the medication used for?  3. When should it be taken?  4. How much should be taken?  5. How will you take it?  6. What side effects should you report?  Understanding Defined as: Excellent: All questions above are correct Good: Questions 1-4 are correct Fair: Questions 1-2 are correct  Poor: 1 or none of the above questions are correct   Pharmacist comments: Patient presents to cardiac rehab this morning to review medications. He is compliant with his medications and is tolerating. Not sure he fully understands what the medications are used for but he does take them. I wrote the indications and things to look for while on these medications. He monitors his BP periodically. Reviewed monitoring and importance as well as goals. He says he feels much better and has more energy since his CABG.   Thanks for the opportunity to participate in the care of this patient,  Isac Sarna, BS [place]ena [place], [place] Pharmacist Pager [phone] 11/04/2018 8:56 AM

## 2018-11-04 NOTE — Progress Notes (Signed)
Cardiac Individual Treatment Plan  Patient Details  Name: Timothy Simpson MRN: [id number] Date of Birth: Apr 11, 1946 Referring Provider:     [place] from 11/04/2018 in [place]  Referring Provider  Roxy Manns      Initial Encounter Date:    CARDIAC REHAB PHASE II ORIENTATION from 11/04/2018 in [place]  Date  11/04/18      Visit Diagnosis: NSTEMI (non-ST elevated myocardial infarction) ([place])  S/P CABG x 3  Patient's Home Medications on Admission:  Current Outpatient Medications:  .  allopurinol (ZYLOPRIM) 100 MG tablet, Take 100 mg by mouth daily., Disp: , Rfl:  .  aspirin EC 81 MG EC tablet, Take 1 tablet (81 mg total) by mouth daily., Disp: , Rfl:  .  atorvastatin (LIPITOR) 80 MG tablet, Take 1 tablet (80 mg total) by mouth daily at 6 PM., Disp: 30 tablet, Rfl: 1 .  clopidogrel (PLAVIX) 75 MG tablet, Take 1 tablet (75 mg total) by mouth daily., Disp: 30 tablet, Rfl: 1 .  ferrous sulfate 325 (65 FE) MG EC tablet, Take 1 tablet (325 mg total) by mouth 2 (two) times daily., Disp: 60 tablet, Rfl: 3 .  metoprolol tartrate (LOPRESSOR) 25 MG tablet, Take 1 tablet (25 mg total) by mouth 2 (two) times daily., Disp: 60 tablet, Rfl: 3  Past Medical History: Past Medical History:  Diagnosis Date  . Coronary artery disease   . Gout   . High cholesterol   . Hypertension   . NSTEMI (non-ST elevated myocardial infarction) ([place]) 07/29/2018  . 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 Tobacco abuse     Tobacco Use: Social History   Tobacco Use  Smoking Status Current Every Day Smoker  . Packs/day: 0.25  . Years: 40.00  . Pack years: 10.00  . Types: Cigarettes  Smokeless Tobacco Never Used  Tobacco Comment   he sated that he would quit if he was told he had to.     Labs: Recent Review Flowsheet Data     Labs for [facility]P Cardiac and Pulmonary Rehab Latest Ref Rng & Units 08/05/2018 08/05/2018 08/05/2018 08/06/2018 08/06/2018   Cholestrol 0 - 200 mg/dL - - - - -   LDLCALC 0 - 99 mg/dL - - - - -   HDL >40 mg/dL - - - - -   Trlycerides <150 mg/dL - - - - -   Hemoglobin A1c 4.8 - 5.6 % - - - - -   PHART 7.350 - 7.450 7.310(L) - 7.304(L) 7.300(L) -   PCO2ART 32.0 - 48.0 mmHg 43.8 - 42.4 41.2 -   HCO3 20.0 - 28.0 mmol/L 22.0 - 21.0 20.1 -   TCO2 22 - 32 mmol/L 23 21(L) 22 21(L) 22   ACIDBASEDEF 0.0 - 2.0 mmol/L 4.0(H) - 5.0(H) 6.0(H) -   O2SAT % 99.0 - 99.0 99.0 -      Capillary Blood Glucose: Lab Results  Component Value Date   GLUCAP 87 08/08/2018   GLUCAP 100 (H) 08/08/2018   GLUCAP 99 08/08/2018   GLUCAP 124 (H) 08/07/2018   GLUCAP 126 (H) 08/07/2018     Exercise Target Goals: Exercise Program Goal: Individual exercise prescription set using results from initial 6 min walk test and THRR while considering  patient's activity barriers and safety.   Exercise Prescription Goal: Starting with aerobic activity 30  plus minutes a day, 3 days per week for initial exercise prescription. Provide home exercise prescription and guidelines that participant acknowledges understanding prior to discharge.  Activity Barriers & Risk Stratification: Activity Barriers & Cardiac Risk Stratification - 11/04/18 1038      Activity Barriers & Cardiac Risk Stratification   Cardiac Risk Stratification  High       6 Minute Walk: 6 Minute Walk    Row Name 11/04/18 1037         6 Minute Walk   Phase  Initial     Distance  1200 feet     Walk Time  6 minutes     # of Rest Breaks  0     MPH  2.27     METS  2.74     RPE  8     Perceived Dyspnea   7     VO2 Peak  10.26     Symptoms  No     Resting HR  73 bpm     Resting BP  148/78     Resting Oxygen Saturation   97 %     Exercise Oxygen Saturation  during 6 min walk  94 %     Max Ex. HR  83 bpm     Max Ex. BP  154/70     2 Minute Post BP  144/78         Oxygen Initial Assessment:   Oxygen Re-Evaluation:   Oxygen Discharge (Final Oxygen Re-Evaluation):   Initial Exercise Prescription: Initial Exercise Prescription - 11/04/18 1000      Date of Initial Exercise RX and Referring Provider   Date  11/04/18    Referring Provider  Roxy Manns    Expected Discharge Date  02/03/19      Treadmill   MPH  1.3    Grade  0    Minutes  17    METs  1.99      NuStep   Level  1    SPM  120    Minutes  22    METs  2.4      Prescription Details   Frequency (times per week)  3    Duration  Progress to 30 minutes of continuous aerobic without signs/symptoms of physical distress      Intensity   THRR 40-80% of Max Heartrate  [phone]    Ratings of Perceived Exertion  11-13    Perceived Dyspnea  0-4      Progression   Progression  Continue to progress workloads to maintain intensity without signs/symptoms of physical distress.      Resistance Training   Training Prescription  Yes    Weight  1    Reps  10-15       Perform Capillary Blood Glucose checks as needed.  Exercise Prescription Changes:  Exercise Prescription Changes    Row Name 11/04/18 1000             Response to Exercise   Blood Pressure (Admit)  148/78       Blood Pressure (Exercise)  154/70       Blood Pressure (Exit)  144/78       Heart Rate (Admit)  73 bpm       Heart Rate (Exercise)  83 bpm       Heart Rate (Exit)  68 bpm       Rating of Perceived Exertion (Exercise)  8       Perceived Dyspnea (  Exercise)  7       Comments  6 minute walk test       Duration  Progress to 30 minutes of  aerobic without signs/symptoms of physical distress       Intensity  THRR New [phone]         Home Exercise Plan   Plans to continue exercise at  Home (comment) walking       Frequency  Add 2 additional days to program exercise sessions.       Initial Home Exercises Provided  11/04/18          Exercise Comments:   Exercise Goals and Review:  Exercise Goals     Row Name 11/04/18 1041             Exercise Goals   Increase Physical Activity  Yes       Intervention  Provide advice, education, support and counseling about physical activity/exercise needs.;Develop an individualized exercise prescription for aerobic and resistive training based on initial evaluation findings, risk stratification, comorbidities and participant's personal goals.       Expected Outcomes  Short Term: Attend rehab on a regular basis to increase amount of physical activity.       Increase Strength and Stamina  Yes       Intervention  Provide advice, education, support and counseling about physical activity/exercise needs.;Develop an individualized exercise prescription for aerobic and resistive training based on initial evaluation findings, risk stratification, comorbidities and participant's personal goals.       Expected Outcomes  Short Term: Increase workloads from initial exercise prescription for resistance, speed, and METs.       Able to understand and use rate of perceived exertion (RPE) scale  Yes       Intervention  Provide education and explanation on how to use RPE scale       Expected Outcomes  Short Term: Able to use RPE daily in rehab to express subjective intensity level;Long Term:  Able to use RPE to guide intensity level when exercising independently       Able to understand and use Dyspnea scale  Yes       Intervention  Provide education and explanation on how to use Dyspnea scale       Expected Outcomes  Short Term: Able to use Dyspnea scale daily in rehab to express subjective sense of shortness of breath during exertion;Long Term: Able to use Dyspnea scale to guide intensity level when exercising independently       Knowledge and understanding of Target Heart Rate Range (THRR)  Yes       Intervention  Provide education and explanation of THRR including how the numbers were predicted and where they are located for reference       Expected Outcomes  Short  Term: Able to state/look up THRR       Able to check pulse independently  Yes       Intervention  Provide education and demonstration on how to check pulse in carotid and radial arteries.;Review the importance of being able to check your own pulse for safety during independent exercise       Expected Outcomes  Short Term: Able to explain why pulse checking is important during independent exercise;Long Term: Able to check pulse independently and accurately       Understanding of Exercise Prescription  Yes       Intervention  Provide education, explanation, and written materials on patient's individual exercise prescription  Expected Outcomes  Short Term: Able to explain program exercise prescription;Long Term: Able to explain home exercise prescription to exercise independently          Exercise Goals Re-Evaluation :    Discharge Exercise Prescription (Final Exercise Prescription Changes): Exercise Prescription Changes - 11/04/18 1000      Response to Exercise   Blood Pressure (Admit)  148/78    Blood Pressure (Exercise)  154/70    Blood Pressure (Exit)  144/78    Heart Rate (Admit)  73 bpm    Heart Rate (Exercise)  83 bpm    Heart Rate (Exit)  68 bpm    Rating of Perceived Exertion (Exercise)  8    Perceived Dyspnea (Exercise)  7    Comments  6 minute walk test    Duration  Progress to 30 minutes of  aerobic without signs/symptoms of physical distress    Intensity  THRR New   [phone]     Home Exercise Plan   Plans to continue exercise at  Home (comment)   walking   Frequency  Add 2 additional days to program exercise sessions.    Initial Home Exercises Provided  11/04/18       Nutrition:  Target Goals: Understanding of nutrition guidelines, daily intake of sodium 1500mg , cholesterol 200mg , calories 30% from fat and 7% or less from saturated fats, daily to have 5 or more servings of fruits and vegetables.  Biometrics: Pre Biometrics - 11/04/18 1041      Pre  Biometrics   Height  6\' 1"  (1.854 m)    Waist Circumference  39.5 inches    Hip Circumference  40 inches    Waist to Hip Ratio  0.99 %    Triceps Skinfold  4 mm    % Body Fat  20.9 %    Grip Strength  16.9 kg    Flexibility  0 in    Single Leg Stand  60 seconds        Nutrition Therapy Plan and Nutrition Goals: Nutrition Therapy & Goals - 11/04/18 1116      Personal Nutrition Goals   Personal Goal #2  Patient is eating low sodium, low fat and heart healthy.    Additional Goals?  No       Nutrition Assessments: Nutrition Assessments - 11/04/18 1121      MEDFICTS Scores   Pre Score  37       Nutrition Goals Re-Evaluation:   Nutrition Goals Discharge (Final Nutrition Goals Re-Evaluation):   Psychosocial: Target Goals: Acknowledge presence or absence of significant depression and/or stress, maximize coping skills, provide positive support system. Participant is able to verbalize types and ability to use techniques and skills needed for reducing stress and depression.  Initial Review & Psychosocial Screening: Initial Psych Review & Screening - 11/04/18 1112      Initial Review   Current issues with  None Identified      Family Dynamics   Good Support System?  Yes      Barriers   Psychosocial barriers to participate in program  There are no identifiable barriers or psychosocial needs.      Screening Interventions   Interventions  Encouraged to exercise    Expected Outcomes  Short Term goal: Identification and review with participant of any Quality of Life or Depression concerns found by scoring the questionnaire.;Long Term goal: The participant improves quality of Life and PHQ9 Scores as seen by post scores and/or verbalization of changes  Quality of Life Scores: Quality of Life - 11/04/18 1043      Quality of Life   Select  Quality of Life      Quality of Life Scores   Health/Function Pre  27.43 %    Socioeconomic Pre  28 %    Psych/Spiritual Pre   28.21 %    Family Pre  27.6 %    GLOBAL Pre  27.73 %      Scores of 19 and below usually indicate a poorer quality of life in these areas.  A difference of  2-3 points is a clinically meaningful difference.  A difference of 2-3 points in the total score of the Quality of Life Index has been associated with significant improvement in overall quality of life, self-image, physical symptoms, and general health in studies assessing change in quality of life.  PHQ-9: Recent Review Flowsheet Data    Depression screen [facility] 2/9 11/04/2018   Decreased Interest 0   Down, Depressed, Hopeless 0   PHQ - 2 Score 0   Altered sleeping 0   Tired, decreased energy 0   Change in appetite 0   Feeling bad or failure about yourself  0   Trouble concentrating 0   Moving slowly or fidgety/restless 0   Suicidal thoughts 0   PHQ-9 Score 0     Interpretation of Total Score  Total Score Depression Severity:  1-4 = Minimal depression, 5-9 = Mild depression, 10-14 = Moderate depression, 15-19 = Moderately severe depression, 20-27 = Severe depression   Psychosocial Evaluation and Intervention: Psychosocial Evaluation - 11/04/18 1115      Psychosocial Evaluation & Interventions   Interventions  Encouraged to exercise with the program and follow exercise prescription    Continue Psychosocial Services   No Follow up required       Psychosocial Re-Evaluation:   Psychosocial Discharge (Final Psychosocial Re-Evaluation):   Vocational Rehabilitation: Provide vocational rehab assistance to qualifying candidates.   Vocational Rehab Evaluation & Intervention: Vocational Rehab - 11/04/18 1122      Initial Vocational Rehab Evaluation & Intervention   Assessment shows need for Vocational Rehabilitation  No       Education: Education Goals: Education classes will be provided on a weekly basis, covering required topics. Participant will state understanding/return demonstration of topics presented.  Learning  Barriers/Preferences: Learning Barriers/Preferences - 11/04/18 1121      Learning Barriers/Preferences   Learning Barriers  None    Learning Preferences  Verbal Instruction;Skilled Demonstration;Individual Instruction       Education Topics: Hypertension, Hypertension Reduction -Define heart disease and high blood pressure. Discus how high blood pressure affects the body and ways to reduce high blood pressure.   Exercise and Your Heart -Discuss why it is important to exercise, the FITT principles of exercise, normal and abnormal responses to exercise, and how to exercise safely.   Angina -Discuss definition of angina, causes of angina, treatment of angina, and how to decrease risk of having angina.   Cardiac Medications -Review what the following cardiac medications are used for, how they affect the body, and side effects that may occur when taking the medications.  Medications include Aspirin, Beta blockers, calcium channel blockers, ACE Inhibitors, angiotensin receptor blockers, diuretics, digoxin, and antihyperlipidemics.   Congestive Heart Failure -Discuss the definition of CHF, how to live with CHF, the signs and symptoms of CHF, and how keep track of weight and sodium intake.   Heart Disease and Intimacy -Discus the effect sexual activity has  on the heart, how changes occur during intimacy as we age, and safety during sexual activity.   Smoking Cessation / COPD -Discuss different methods to quit smoking, the health benefits of quitting smoking, and the definition of COPD.   Nutrition I: Fats -Discuss the types of cholesterol, what cholesterol does to the heart, and how cholesterol levels can be controlled.   Nutrition II: Labels -Discuss the different components of food labels and how to read food label   Heart Parts/Heart Disease and PAD -Discuss the anatomy of the heart, the pathway of blood circulation through the heart, and these are affected by heart  disease.   Stress I: Signs and Symptoms -Discuss the causes of stress, how stress may lead to anxiety and depression, and ways to limit stress.   Stress II: Relaxation -Discuss different types of relaxation techniques to limit stress.   Warning Signs of Stroke / TIA -Discuss definition of a stroke, what the signs and symptoms are of a stroke, and how to identify when someone is having stroke.   Knowledge Questionnaire Score: Knowledge Questionnaire Score - 11/04/18 1122      Knowledge Questionnaire Score   Pre Score  24/28       Core Components/Risk Factors/Patient Goals at Admission: Personal Goals and Risk Factors at Admission - 11/04/18 1123      Core Components/Risk Factors/Patient Goals on Admission    Weight Management  Weight Maintenance    Personal Goal Other  Yes    Personal Goal  To get a good report about his heart.     Intervention  Attend CR 3 x week and supplement with 2 x week at home exercise.     Expected Outcomes  Achieve personal goals.        Core Components/Risk Factors/Patient Goals Review:    Core Components/Risk Factors/Patient Goals at Discharge (Final Review):    ITP Comments:   Comments: Patient arrived for 1st visit/orientation/education at 0800. Patient was referred to CR by Dr. Roxy Manns due to NSTEMI (I21.4) and CABGx3 (Z95.1). During orientation advised patient on arrival and appointment times what to wear, what to do before, during and after exercise. Reviewed attendance and class policy. Talked about inclement weather and class consultation policy. Pt is scheduled to return Cardiac Rehab on 11/09/18 at 0815. Pt was advised to come to class 15 minutes before class starts. Patient was also given instructions on meeting with the dietician and attending the Family Structure classes. Discussed RPE/Dpysnea scales. Discussed initial THR and how to find their radial and/or carotid pulse. Discussed the initial exercise prescription and how this effects  their progress. Pt is eager to get started. Patient participated in warm up stretches followed by light weights and resistance bands. Patient was able to complete 6 minute walk test. Patient did not c/o pain. Patient was measured for the equipment. Discussed equipment safety with patient. Took patient pre-anthropometric measurements. Patient finished visit at 1030.

## 2018-11-09 ENCOUNTER — Other Ambulatory Visit: Payer: Self-pay | Admitting: Physician Assistant

## 2018-11-09 ENCOUNTER — Encounter (HOSPITAL_COMMUNITY)
Admission: RE | Admit: 2018-11-09 | Discharge: 2018-11-09 | Disposition: A | Discharge status: Home / Self Care | Payer: Medicare HMO | Source: Ambulatory Visit | Attending: Thoracic Surgery (Cardiothoracic Vascular Surgery) | Admitting: Thoracic Surgery (Cardiothoracic Vascular Surgery)

## 2018-11-09 DIAGNOSIS — Z951 Presence of aortocoronary bypass graft: Secondary | ICD-10-CM

## 2018-11-09 DIAGNOSIS — I214 Non-ST elevation (NSTEMI) myocardial infarction: Secondary | ICD-10-CM

## 2018-11-09 NOTE — Progress Notes (Signed)
Daily Session Note  Patient Details  Name: Timothy Simpson MRN: [id number] Date of Birth: [birth year] Referring Provider:     [place] from 11/04/2018 in [place]  Referring Provider  Roxy Manns      Encounter Date: 11/09/2018  Check In: Session Check In - 11/09/18 0815      Check-In   Supervising physician immediately available to respond to emergencies  See telemetry face sheet for immediately available MD    Location  [facility]    Staff Present  Aundra Dubin, RN, Cory Munch, Exercise Physiologist    Medication changes reported      No    Fall or balance concerns reported     No    Tobacco Cessation  No Change    Warm-up and Cool-down  Performed as group-led instruction    Resistance Training Performed  Yes    VAD Patient?  No    PAD/SET Patient?  No      Pain Assessment   Currently in Pain?  No/denies    Pain Score  0-No pain    Multiple Pain Sites  No       Capillary Blood Glucose: No results found for this or any previous visit (from the past 24 hour(s)).    Social History   Tobacco Use  Smoking Status Current Every Day Smoker  . Packs/day: 0.25  . Years: 40.00  . Pack years: 10.00  . Types: Cigarettes  Smokeless Tobacco Never Used  Tobacco Comment   he sated that he would quit if he was told he had to.     Goals Met:  Independence with exercise equipment Exercise tolerated well No report of cardiac concerns or symptoms Strength training completed today  Goals Unmet:  Not Applicable  Comments: Pt able to follow exercise prescription today without complaint.  Will continue to monitor for progression. Check out 915.   Dr. Kate Sable is Medical Director for [facility] Cardiac and Pulmonary Rehab.

## 2018-11-11 ENCOUNTER — Encounter (HOSPITAL_COMMUNITY)
Admission: RE | Admit: 2018-11-11 | Discharge: 2018-11-11 | Disposition: A | Discharge status: Home / Self Care | Payer: Medicare HMO | Source: Ambulatory Visit | Attending: Thoracic Surgery (Cardiothoracic Vascular Surgery) | Admitting: Thoracic Surgery (Cardiothoracic Vascular Surgery)

## 2018-11-11 DIAGNOSIS — I214 Non-ST elevation (NSTEMI) myocardial infarction: Secondary | ICD-10-CM

## 2018-11-11 DIAGNOSIS — Z951 Presence of aortocoronary bypass graft: Secondary | ICD-10-CM

## 2018-11-11 NOTE — Progress Notes (Signed)
Daily Session Note  Patient Details  Name: Timothy Simpson MRN: [id number] Date of Birth: 04-24-1946 Referring Provider:     [place] from 11/04/2018 in [place]  Referring Provider  Roxy Manns      Encounter Date: 11/11/2018  Check In: Session Check In - 11/11/18 0815      Check-In   Supervising physician immediately available to respond to emergencies  See telemetry face sheet for immediately available MD    Location  [facility]    Staff Present  Aundra Dubin, RN, Cory Munch, Exercise Physiologist;Other    Medication changes reported      No    Fall or balance concerns reported     No    Tobacco Cessation  No Change    Warm-up and Cool-down  Performed as group-led instruction    Resistance Training Performed  Yes    VAD Patient?  No    PAD/SET Patient?  No      Pain Assessment   Currently in Pain?  No/denies    Pain Score  0-No pain    Multiple Pain Sites  No       Capillary Blood Glucose: No results found for this or any previous visit (from the past 24 hour(s)).    Social History   Tobacco Use  Smoking Status Current Every Day Smoker  . Packs/day: 0.25  . Years: 40.00  . Pack years: 10.00  . Types: Cigarettes  Smokeless Tobacco Never Used  Tobacco Comment   he sated that he would quit if he was told he had to.     Goals Met:  Independence with exercise equipment Exercise tolerated well No report of cardiac concerns or symptoms Strength training completed today  Goals Unmet:  Not Applicable  Comments: Pt able to follow exercise prescription today without complaint.  Will continue to monitor for progression. Check out 915.   Dr. Kate Sable is Medical Director for [facility] Cardiac and Pulmonary Rehab.

## 2018-11-13 ENCOUNTER — Encounter (HOSPITAL_COMMUNITY)
Admission: RE | Admit: 2018-11-13 | Discharge: 2018-11-13 | Disposition: A | Discharge status: Home / Self Care | Payer: Medicare HMO | Source: Ambulatory Visit | Attending: Thoracic Surgery (Cardiothoracic Vascular Surgery) | Admitting: Thoracic Surgery (Cardiothoracic Vascular Surgery)

## 2018-11-13 DIAGNOSIS — Z951 Presence of aortocoronary bypass graft: Secondary | ICD-10-CM

## 2018-11-13 DIAGNOSIS — I214 Non-ST elevation (NSTEMI) myocardial infarction: Secondary | ICD-10-CM

## 2018-11-13 NOTE — Progress Notes (Signed)
Daily Session Note  Patient Details  Name: Timothy Simpson MRN: [id number] Date of Birth: [birth year] Referring Provider:     [place] from 11/04/2018 in [place]  Referring Provider  Roxy Manns      Encounter Date: 11/13/2018  Check In: Session Check In - 11/13/18 0815      Check-In   Supervising physician immediately available to respond to emergencies  See telemetry face sheet for immediately available MD    Location  [facility]    Staff Present  Aundra Dubin, RN, Cory Munch, Exercise Physiologist;Diane Coad, MS, EP, [facility], Exercise Physiologist    Medication changes reported      No    Fall or balance concerns reported     No    Tobacco Cessation  No Change    Warm-up and Cool-down  Performed as group-led instruction    Resistance Training Performed  Yes    VAD Patient?  No    PAD/SET Patient?  No      Pain Assessment   Currently in Pain?  No/denies    Pain Score  0-No pain    Multiple Pain Sites  No       Capillary Blood Glucose: No results found for this or any previous visit (from the past 24 hour(s)).    Social History   Tobacco Use  Smoking Status Current Every Day Smoker  . Packs/day: 0.25  . Years: 40.00  . Pack years: 10.00  . Types: Cigarettes  Smokeless Tobacco Never Used  Tobacco Comment   he sated that he would quit if he was told he had to.     Goals Met:  Independence with exercise equipment Exercise tolerated well No report of cardiac concerns or symptoms Strength training completed today  Goals Unmet:  Not Applicable  Comments: Pt able to follow exercise prescription today without complaint.  Will continue to monitor for progression. Check out 0915.   Dr. Kate Sable is Medical Director for [facility] Cardiac and Pulmonary Rehab.

## 2018-11-16 ENCOUNTER — Encounter (HOSPITAL_COMMUNITY)
Admission: RE | Admit: 2018-11-16 | Discharge: 2018-11-16 | Disposition: A | Discharge status: Home / Self Care | Payer: Medicare HMO | Source: Ambulatory Visit | Attending: Thoracic Surgery (Cardiothoracic Vascular Surgery) | Admitting: Thoracic Surgery (Cardiothoracic Vascular Surgery)

## 2018-11-16 DIAGNOSIS — I214 Non-ST elevation (NSTEMI) myocardial infarction: Secondary | ICD-10-CM

## 2018-11-16 DIAGNOSIS — Z951 Presence of aortocoronary bypass graft: Secondary | ICD-10-CM

## 2018-11-16 NOTE — Progress Notes (Signed)
Daily Session Note  Patient Details  Name: Timothy Simpson MRN: [id number] Date of Birth: 05-Aug-1946 Referring Provider:     [place] from 11/04/2018 in [place]  Referring Provider  Roxy Manns      Encounter Date: 11/16/2018  Check In: Session Check In - 11/16/18 0815      Check-In   Supervising physician immediately available to respond to emergencies  See telemetry face sheet for immediately available MD    Location  [facility]    Staff Present  Aundra Dubin, RN, Cory Munch, Exercise Physiologist;Diane Coad, MS, EP, [facility], Exercise Physiologist    Medication changes reported      No    Fall or balance concerns reported     No    Tobacco Cessation  No Change    Warm-up and Cool-down  Performed as group-led instruction    Resistance Training Performed  Yes    VAD Patient?  No    PAD/SET Patient?  No      Pain Assessment   Currently in Pain?  No/denies    Pain Score  0-No pain    Multiple Pain Sites  No       Capillary Blood Glucose: No results found for this or any previous visit (from the past 24 hour(s)).    Social History   Tobacco Use  Smoking Status Current Every Day Smoker  . Packs/day: 0.25  . Years: 40.00  . Pack years: 10.00  . Types: Cigarettes  Smokeless Tobacco Never Used  Tobacco Comment   he sated that he would quit if he was told he had to.     Goals Met:  Independence with exercise equipment Exercise tolerated well No report of cardiac concerns or symptoms Strength training completed today  Goals Unmet:  Not Applicable  Comments: Pt able to follow exercise prescription today without complaint.  Will continue to monitor for progression. Check out 0915.   Dr. Kate Sable is Medical Director for [facility].

## 2018-11-18 ENCOUNTER — Encounter (HOSPITAL_COMMUNITY)
Admission: RE | Admit: 2018-11-18 | Discharge: 2018-11-18 | Disposition: A | Discharge status: Home / Self Care | Payer: Medicare HMO | Source: Ambulatory Visit | Attending: Thoracic Surgery (Cardiothoracic Vascular Surgery) | Admitting: Thoracic Surgery (Cardiothoracic Vascular Surgery)

## 2018-11-18 DIAGNOSIS — I214 Non-ST elevation (NSTEMI) myocardial infarction: Secondary | ICD-10-CM | POA: Diagnosis not present

## 2018-11-18 DIAGNOSIS — Z951 Presence of aortocoronary bypass graft: Secondary | ICD-10-CM

## 2018-11-18 NOTE — Progress Notes (Signed)
Daily Session Note  Patient Details  Name: Timothy Simpson MRN: [id number] Date of Birth: 08-Apr-1946 Referring Provider:     [place] from 11/04/2018 in [place]  Referring Provider  Roxy Manns      Encounter Date: 11/18/2018  Check In: Session Check In - 11/18/18 0815      Check-In   Supervising physician immediately available to respond to emergencies  See telemetry face sheet for immediately available MD    Location  [facility]    Staff Present  Aundra Dubin, RN, Cory Munch, Exercise Physiologist;Diane Coad, MS, EP, [facility], Exercise Physiologist    Medication changes reported      No    Fall or balance concerns reported     No    Tobacco Cessation  No Change    Warm-up and Cool-down  Performed as group-led instruction    Resistance Training Performed  Yes    VAD Patient?  No    PAD/SET Patient?  No      Pain Assessment   Currently in Pain?  No/denies    Pain Score  0-No pain    Multiple Pain Sites  No       Capillary Blood Glucose: No results found for this or any previous visit (from the past 24 hour(s)).    Social History   Tobacco Use  Smoking Status Current Every Day Smoker  . Packs/day: 0.25  . Years: 40.00  . Pack years: 10.00  . Types: Cigarettes  Smokeless Tobacco Never Used  Tobacco Comment   he sated that he would quit if he was told he had to.     Goals Met:  Independence with exercise equipment Exercise tolerated well No report of cardiac concerns or symptoms Strength training completed today  Goals Unmet:  Not Applicable  Comments: Pt able to follow exercise prescription today without complaint.  Will continue to monitor for progression. Check out 0915.   Dr. Kate Sable is Medical Director for [facility] Cardiac and Pulmonary Rehab.

## 2018-11-18 NOTE — Progress Notes (Signed)
Cardiology Office Note    Date:  11/19/2018   ID:  Timothy Simpson, DOB [birth year], MRN [id number]  PCP:  Jani Gravel, MD  Cardiologist: Glenetta Hew, MD  --> wishes to follow in [place], [place]  Chief Complaint  Patient presents with  . Follow-up    3 month visit    History of Present Illness:    Timothy Simpson is a 72 y.o. male with past medical history of CAD (s/p CABG in 08/2018 with LIMA-distal LAD and seq-SVG-medial and lateral branch of RI), carotid artery stenosis, HTN, HLD, and tobacco use who presents to the office today for 53-month follow-up.   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 He ultimately underwent CABG during that admission. At the time of his office visit, he reported overall doing well and denied any recent chest pain or dyspnea on exertion. He did have occasional black tarry stools but was also taking iron supplementation. Repeat CBC was obtained and showed that his hemoglobin had improved to 10.2, previously 7.8 at the time of hospital discharge. Was continued on his current medication regimen including ASA, Plavix, Atorvastatin, and Lopressor.  In talking with the patient today, he reports overall doing well from a cardiac perspective since his last office visit. He denies any recent chest pain, dyspnea on exertion, orthopnea, PND, or lower extremity edema. He is participating in cardiac rehab and reports overall progressing well with this.  He does not follow his blood pressure regularly at home but it is well controlled at 134/76 during today's visit. Reports weight has overall been stable on his home scales.  He does continue to smoke but has reduced his use from 1 pack/day down to 5 to 6 cigarettes/day.   Past Medical History:  Diagnosis Date  . Coronary artery disease   . Gout    . High cholesterol   . Hypertension   . NSTEMI (non-ST elevated myocardial infarction) ([place]) 07/29/2018  . 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 Tobacco abuse     Past Surgical History:  Procedure Laterality Date  . BACK SURGERY    . CORONARY ARTERY BYPASS GRAFT N/A 08/05/2018   Procedure: CORONARY ARTERY BYPASS GRAFTING (CABG) x3. Endoscopic Saphenous vein harvest.;  Surgeon: Rexene Alberts, MD;  Location: [facility];  Service: Open Heart Surgery;  Laterality: N/A;  . INTRAVASCULAR PRESSURE WIRE/FFR STUDY N/A 08/04/2018   Procedure: INTRAVASCULAR PRESSURE WIRE/FFR STUDY;  Surgeon: Burnell Blanks, MD;  Location: [place] [facility];  Service: Cardiovascular;  Laterality: N/A;  . LEFT HEART CATH AND CORONARY ANGIOGRAPHY N/A 07/30/2018   Procedure: LEFT HEART CATH AND CORONARY ANGIOGRAPHY;  Surgeon: Jettie Booze, MD;  Location: [facility];  Service: Cardiovascular;  Laterality: N/A;  . TEE WITHOUT CARDIOVERSION N/A 08/05/2018   Procedure: TRANSESOPHAGEAL ECHOCARDIOGRAM (TEE);  Surgeon: Rexene Alberts, MD;  Location: [facility];  Service: Open Heart Surgery;  Laterality: N/A;    Current Medications: Outpatient Medications Prior to Visit  Medication Sig Dispense Refill  . allopurinol (ZYLOPRIM) 100 MG tablet Take 100 mg by mouth daily.    Marland Kitchen aspirin EC 81 MG EC tablet Take 1 tablet (81 mg total) by mouth daily.    Marland Kitchen  atorvastatin (LIPITOR) 80 MG tablet Take 1 tablet (80 mg total) by mouth daily at 6 PM. 30 tablet 1  . clopidogrel (PLAVIX) 75 MG tablet Take 1 tablet (75 mg total) by mouth daily. 30 tablet 1  . ferrous sulfate 325 (65 FE) MG EC tablet Take 1 tablet (325 mg total) by mouth 2 (two) times daily. 60 tablet 3  . metoprolol tartrate (LOPRESSOR) 25 MG tablet Take 1 tablet (25 mg total) by mouth 2 (two) times daily. 60 tablet 3   No facility-administered  medications prior to visit.      Allergies:   Patient has no known allergies.   Social History   Socioeconomic History  . Marital status: Married    Spouse name: Not on file  . Number of children: Not on file  . Years of education: Not on file  . Highest education level: Not on file  Occupational History  . Not on file  Social Needs  . Financial resource strain: Not on file  . Food insecurity:    Worry: Not on file    Inability: Not on file  . Transportation needs:    Medical: Not on file    Non-medical: Not on file  Tobacco Use  . Smoking status: Current Every Day Smoker    Packs/day: 0.25    Years: 40.00    Pack years: 10.00    Types: Cigarettes  . Smokeless tobacco: Never Used  . Tobacco comment: he sated that he would quit if he was told he had to.   Substance and Sexual Activity  . Alcohol use: No  . Drug use: No  . Sexual activity: Not on file  Lifestyle  . Physical activity:    Days per week: Not on file    Minutes per session: Not on file  . Stress: Not on file  Relationships  . Social connections:    Talks on phone: Not on file    Gets together: Not on file    Attends religious service: Not on file    Active member of club or organization: Not on file    Attends meetings of clubs or organizations: Not on file    Relationship status: Not on file  Other Topics Concern  . Not on file  Social History Narrative  . Not on file     Family History:  The patient's family history includes Hypertension in his father.   Review of Systems:   Please see the history of present illness.     General:  No chills, fever, night sweats or weight changes.  Cardiovascular:  No chest pain, dyspnea on exertion, edema, orthopnea, palpitations, paroxysmal nocturnal dyspnea. Dermatological: No rash, lesions/masses Respiratory: No cough, dyspnea Urologic: No hematuria, dysuria Abdominal:   No nausea, vomiting, diarrhea, bright red blood per rectum, melena, or  hematemesis Neurologic:  No visual changes, wkns, changes in mental status.  He denies any of the above symptoms.   All other systems reviewed and are otherwise negative except as noted above.   Physical Exam:    VS:  BP 134/76   Pulse 75   Ht 6\' 1"  (1.854 m)   Wt 198 lb (89.8 kg)   SpO2 98%   BMI 26.12 kg/m    General: Well developed, well nourished [place] American male appearing in no acute distress. Head: Normocephalic, atraumatic, sclera non-icteric, no xanthomas, nares are without discharge.  Neck: Right carotid bruit. JVD not elevated.  Lungs: Respirations regular and unlabored, without wheezes or  rales.  Heart: Regular rate and rhythm. No S3 or S4.  No murmur, no rubs, or gallops appreciated. Sternal incision appears well-healing.  Abdomen: Soft, non-tender, non-distended with normoactive bowel sounds. No hepatomegaly. No rebound/guarding. No obvious abdominal masses. Msk:  Strength and tone appear normal for age. No joint deformities or effusions. Extremities: No clubbing or cyanosis. No lower extremity edema.  Distal pedal pulses are 2+ bilaterally. Neuro: Alert and oriented X 3. Moves all extremities spontaneously. No focal deficits noted. Psych:  Responds to questions appropriately with a normal affect. Skin: No rashes or lesions noted  Wt Readings from Last 3 Encounters:  11/19/18 198 lb (89.8 kg)  11/04/18 193 lb 8 oz (87.8 kg)  09/27/18 200 lb (90.7 kg)     Studies/Labs Reviewed:   EKG:  EKG is not ordered today.    Recent Labs: 08/01/2018: ALT 13 08/06/2018: Magnesium 2.2 08/08/2018: BUN 11; Creatinine, Ser 0.95; Potassium 3.8; Sodium 140 08/24/2018: Hemoglobin 10.2; Platelets 376   Lipid Panel    Component Value Date/Time   CHOL 166 07/30/2018 0355   TRIG 100 07/30/2018 0355   HDL 37 (L) 07/30/2018 0355   CHOLHDL 4.5 07/30/2018 0355   VLDL 20 07/30/2018 0355   LDLCALC 109 (H) 07/30/2018 0355    Additional studies/ records that were reviewed today  include:   Cardiac Catheterization: 07/30/2018  Mid LM to Dist LM lesion is 50% stenosed. 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  Ost Ramus lesion is 95% stenosed.  Lat Ramus lesion is 99% stenosed.  Dist RCA lesion is 100% stenosed.  Mid Cx lesion is 70% stenosed.  Prox LAD lesion is 60% stenosed.  The left ventricular systolic function is normal.  LV end diastolic pressure is normal. LVEDP 6 mm Hg.  The left ventricular ejection fraction is 55-65% by visual estimate.  There is no aortic valve stenosis.   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    Limited Echocardiogram: 08/10/2018 Study Conclusions  - Left ventricle: The cavity size was normal. Systolic function was   normal. The estimated ejection fraction was in the range of 55%   to 60%. Wall motion was normal; there were no regional wall   motion abnormalities. Features are consistent with a pseudonormal   left ventricular filling pattern, with concomitant abnormal   relaxation and increased filling pressure (grade 2 diastolic   dysfunction). - Aortic valve: Valve mobility was restricted. Transvalvular   velocity was within the normal range. There was no stenosis.   There was no regurgitation. - Mitral valve: Transvalvular velocity was within the normal range.   There was no evidence for stenosis. There was mild regurgitation. - Right ventricle: The cavity size was normal. Wall thickness was   normal. Systolic function was normal. - Atrial septum: No defect or patent foramen ovale was identified   by color flow Doppler. - Tricuspid valve: There was mild regurgitation.  Carotid Artery Stenosis: 08/2018 IMPRESSION: Right greater than left carotid atherosclerosis.  Right ICA  stenosis estimated greater than 70% by ultrasound criteria  Left ICA narrowing less than 50%.  Assessment:    1. Coronary artery disease involving native coronary artery of native heart without angina pectoris   2. Bilateral carotid artery stenosis   3. Essential hypertension  4. Hyperlipidemia LDL goal <70   5. Chronic anemia   6. Tobacco use      Plan:   In order of problems listed above:  1.  CAD  - s/p CABG in 08/2018 with LIMA-distal LAD and seq-SVG-medial and lateral branch of RI.  He denies any recurrent chest pain or dyspnea on exertion.  He is currently in cardiac rehab and is progressing well through the program. - Continue current medication regimen of ASA 81 mg daily, Plavix 75 mg daily, Atorvastatin 80 mg daily, and Lopressor 25 mg twice daily.  2. Carotid Artery Stenosis - recent dopplers in 08/2018 showed RICA stenosis estimated greater than 70% by ultrasound criteria and LICA narrowing less than 50%. - Reviewed results with the patient in detail today. He will need repeat carotid dopplers in 08/2019. - Continue ASA and statin therapy.   3. HTN - BP is well controlled at 134/76 during today's visit. Continue Lopressor 25 mg twice daily.  4. HLD - Followed by PCP. Goal LDL is less than 70 in the setting of known CAD. He remains on Atorvastatin 80 mg daily.  5. Chronic Anemia - Hemoglobin improved to 10.2 when checked at his last office visit, rechecked by PCP in 09/2018 and stable at 11.7. He denies any recent melena, hematochezia, or hematuria. Remains on daily iron supplementation.  6. Tobacco Use - he continues to smoke but has reduced his use from 1 pack/day down to 5 to 6 cigarettes/day. Congratulated on his reduction with full cessation advised.     Medication Adjustments/Labs and Tests Ordered: Current medicines are reviewed at length with the patient today.  Concerns regarding medicines are outlined above.  Medication changes, Labs and Tests ordered  today are listed in the Patient Instructions below. Patient Instructions  Medication Instructions:  Your physician recommends that you continue on your current medications as directed. Please refer to the Current Medication list given to you today.  If you need a refill on your cardiac medications before your next appointment, please call your pharmacy.   Lab work: NONE  If you have labs (blood work) drawn today and your tests are completely normal, you will receive your results only by: Marland Kitchen MyChart Message (if you have MyChart) OR . A paper copy in the mail If you have any lab test that is abnormal or we need to change your treatment, we will call you to review the results.  Testing/Procedures: NONE   Follow-Up: At [facility], you and your health needs are our priority.  As part of our continuing mission to provide you with exceptional heart care, we have created designated Provider Care Teams.  These Care Teams include your primary Cardiologist (physician) and Advanced Practice Providers (APPs -  Physician Assistants and Nurse Practitioners) who all work together to provide you with the care you need, when you need it. You will need a follow up appointment in 6 months.  Please call our office 2 months in advance to schedule this appointment.  You may see Glenetta Hew, MD or one of the following Advanced Practice Providers on your designated Care Team:   Bernerd Pho, PA-C [facility]) . Ermalinda Barrios, PA-C ([place])  Any Other Special Instructions Will Be Listed Below (If Applicable). Thank you for choosing [facility]!     Signed, Erma Heritage, PA-C  11/19/2018 4:36 PM    [place] [address] [place], [place] [address] Phone: [phone]

## 2018-11-19 ENCOUNTER — Ambulatory Visit: Payer: Medicare HMO | Admitting: Student

## 2018-11-19 ENCOUNTER — Encounter: Payer: Self-pay | Admitting: Student

## 2018-11-19 VITALS — BP 134/76 | HR 75 | Ht 73.0 in | Wt 198.0 lb

## 2018-11-19 DIAGNOSIS — I10 Essential (primary) hypertension: Secondary | ICD-10-CM | POA: Diagnosis not present

## 2018-11-19 DIAGNOSIS — Z720 Tobacco use: Secondary | ICD-10-CM

## 2018-11-19 DIAGNOSIS — I6523 Occlusion and stenosis of bilateral carotid arteries: Secondary | ICD-10-CM

## 2018-11-19 DIAGNOSIS — E785 Hyperlipidemia, unspecified: Secondary | ICD-10-CM

## 2018-11-19 DIAGNOSIS — D649 Anemia, unspecified: Secondary | ICD-10-CM

## 2018-11-19 DIAGNOSIS — I2510 Atherosclerotic heart disease of native coronary artery without angina pectoris: Secondary | ICD-10-CM

## 2018-11-19 NOTE — Patient Instructions (Signed)
Medication Instructions:  Your physician recommends that you continue on your current medications as directed. Please refer to the Current Medication list given to you today.  If you need a refill on your cardiac medications before your next appointment, please call your pharmacy.   Lab work: NONE  If you have labs (blood work) drawn today and your tests are completely normal, you will receive your results only by: Marland Kitchen MyChart Message (if you have MyChart) OR . A paper copy in the mail If you have any lab test that is abnormal or we need to change your treatment, we will call you to review the results.  Testing/Procedures: NONE   Follow-Up: At [facility], you and your health needs are our priority.  As part of our continuing mission to provide you with exceptional heart care, we have created designated Provider Care Teams.  These Care Teams include your primary Cardiologist (physician) and Advanced Practice Providers (APPs -  Physician Assistants and Nurse Practitioners) who all work together to provide you with the care you need, when you need it. You will need a follow up appointment in 6 months.  Please call our office 2 months in advance to schedule this appointment.  You may see Glenetta Hew, MD or one of the following Advanced Practice Providers on your designated Care Team:   Bernerd Pho, PA-C [facility]) . Ermalinda Barrios, PA-C ([place])  Any Other Special Instructions Will Be Listed Below (If Applicable). Thank you for choosing [facility]!

## 2018-11-19 NOTE — Progress Notes (Signed)
Cardiac Individual Treatment Plan  Patient Details  Name: Timothy Simpson MRN: [id number] Date of Birth: Apr 11, 1946 Referring Provider:     [place] from 11/04/2018 in [place]  Referring Provider  Roxy Manns      Initial Encounter Date:    CARDIAC REHAB PHASE II ORIENTATION from 11/04/2018 in [place]  Date  11/04/18      Visit Diagnosis: NSTEMI (non-ST elevated myocardial infarction) ([place])  S/P CABG x 3  Patient's Home Medications on Admission:  Current Outpatient Medications:  .  allopurinol (ZYLOPRIM) 100 MG tablet, Take 100 mg by mouth daily., Disp: , Rfl:  .  aspirin EC 81 MG EC tablet, Take 1 tablet (81 mg total) by mouth daily., Disp: , Rfl:  .  atorvastatin (LIPITOR) 80 MG tablet, Take 1 tablet (80 mg total) by mouth daily at 6 PM., Disp: 30 tablet, Rfl: 1 .  clopidogrel (PLAVIX) 75 MG tablet, Take 1 tablet (75 mg total) by mouth daily., Disp: 30 tablet, Rfl: 1 .  ferrous sulfate 325 (65 FE) MG EC tablet, Take 1 tablet (325 mg total) by mouth 2 (two) times daily., Disp: 60 tablet, Rfl: 3 .  metoprolol tartrate (LOPRESSOR) 25 MG tablet, Take 1 tablet (25 mg total) by mouth 2 (two) times daily., Disp: 60 tablet, Rfl: 3  Past Medical History: Past Medical History:  Diagnosis Date  . Coronary artery disease   . Gout   . High cholesterol   . Hypertension   . NSTEMI (non-ST elevated myocardial infarction) ([place]) 07/29/2018  . 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 Tobacco abuse     Tobacco Use: Social History   Tobacco Use  Smoking Status Current Every Day Smoker  . Packs/day: 0.25  . Years: 40.00  . Pack years: 10.00  . Types: Cigarettes  Smokeless Tobacco Never Used  Tobacco Comment   he sated that he would quit if he was told he had to.     Labs: Recent Review Flowsheet Data     Labs for ITP Cardiac and Pulmonary Rehab Latest Ref Rng & Units 08/05/2018 08/05/2018 08/05/2018 08/06/2018 08/06/2018   Cholestrol 0 - 200 mg/dL - - - - -   LDLCALC 0 - 99 mg/dL - - - - -   HDL >40 mg/dL - - - - -   Trlycerides <150 mg/dL - - - - -   Hemoglobin A1c 4.8 - 5.6 % - - - - -   PHART 7.350 - 7.450 7.310(L) - 7.304(L) 7.300(L) -   PCO2ART 32.0 - 48.0 mmHg 43.8 - 42.4 41.2 -   HCO3 20.0 - 28.0 mmol/L 22.0 - 21.0 20.1 -   TCO2 22 - 32 mmol/L 23 21(L) 22 21(L) 22   ACIDBASEDEF 0.0 - 2.0 mmol/L 4.0(H) - 5.0(H) 6.0(H) -   O2SAT % 99.0 - 99.0 99.0 -      Capillary Blood Glucose: Lab Results  Component Value Date   GLUCAP 87 08/08/2018   GLUCAP 100 (H) 08/08/2018   GLUCAP 99 08/08/2018   GLUCAP 124 (H) 08/07/2018   GLUCAP 126 (H) 08/07/2018     Exercise Target Goals: Exercise Program Goal: Individual exercise prescription set using results from initial 6 min walk test and THRR while considering  patient's activity barriers and safety.   Exercise Prescription Goal: Starting with aerobic activity 30  plus minutes a day, 3 days per week for initial exercise prescription. Provide home exercise prescription and guidelines that participant acknowledges understanding prior to discharge.  Activity Barriers & Risk Stratification: Activity Barriers & Cardiac Risk Stratification - 11/04/18 1038      Activity Barriers & Cardiac Risk Stratification   Cardiac Risk Stratification  High       6 Minute Walk: 6 Minute Walk    Row Name 11/04/18 1037         6 Minute Walk   Phase  Initial     Distance  1200 feet     Walk Time  6 minutes     # of Rest Breaks  0     MPH  2.27     METS  2.74     RPE  8     Perceived Dyspnea   7     VO2 Peak  10.26     Symptoms  No     Resting HR  73 bpm     Resting BP  148/78     Resting Oxygen Saturation   97 %     Exercise Oxygen Saturation  during 6 min walk  94 %     Max Ex. HR  83 bpm     Max Ex. BP  154/70     2 Minute Post BP  144/78         Oxygen Initial Assessment:   Oxygen Re-Evaluation:   Oxygen Discharge (Final Oxygen Re-Evaluation):   Initial Exercise Prescription: Initial Exercise Prescription - 11/04/18 1000      Date of Initial Exercise RX and Referring Provider   Date  11/04/18    Referring Provider  Roxy Manns    Expected Discharge Date  02/03/19      Treadmill   MPH  1.3    Grade  0    Minutes  17    METs  1.99      NuStep   Level  1    SPM  120    Minutes  22    METs  2.4      Prescription Details   Frequency (times per week)  3    Duration  Progress to 30 minutes of continuous aerobic without signs/symptoms of physical distress      Intensity   THRR 40-80% of Max Heartrate  [phone]    Ratings of Perceived Exertion  11-13    Perceived Dyspnea  0-4      Progression   Progression  Continue to progress workloads to maintain intensity without signs/symptoms of physical distress.      Resistance Training   Training Prescription  Yes    Weight  1    Reps  10-15       Perform Capillary Blood Glucose checks as needed.  Exercise Prescription Changes:  Exercise Prescription Changes    Row Name 11/04/18 1000 11/17/18 1500           Response to Exercise   Blood Pressure (Admit)  148/78  130/70      Blood Pressure (Exercise)  154/70  132/70      Blood Pressure (Exit)  144/78  126/80      Heart Rate (Admit)  73 bpm  64 bpm      Heart Rate (Exercise)  83 bpm  87 bpm      Heart Rate (Exit)  68 bpm  73 bpm      Rating of Perceived Exertion (Exercise)  8  11      Perceived Dyspnea (Exercise)  7  -      Comments  6 minute walk test  First two weeks of exercise      Duration  Progress to 30 minutes of  aerobic without signs/symptoms of physical distress  Progress to 30 minutes of  aerobic without signs/symptoms of physical distress      Intensity  THRR New [phone]  THRR unchanged        Resistance Training   Training Prescription  -  Yes      Weight  -  2      Reps  -  10-15         Treadmill   MPH  -  1.5      Grade  -  0      Minutes  -  17      METs  -  2.1        NuStep   Level  -  1      SPM  -  91      Minutes  -  22      METs  -  2.3        Home Exercise Plan   Plans to continue exercise at  Home (comment) walking  Home (comment)      Frequency  Add 2 additional days to program exercise sessions.  Add 2 additional days to program exercise sessions.      Initial Home Exercises Provided  11/04/18  11/04/18         Exercise Comments:  Exercise Comments    Row Name 11/17/18 1554           Exercise Comments  Pt. has done well so far. He has attended 5 exercise sessions and is always willing to do what is asked. Will progress as tolerated.           Exercise Goals and Review:  Exercise Goals    Row Name 11/04/18 1041             Exercise Goals   Increase Physical Activity  Yes       Intervention  Provide advice, education, support and counseling about physical activity/exercise needs.;Develop an individualized exercise prescription for aerobic and resistive training based on initial evaluation findings, risk stratification, comorbidities and participant's personal goals.       Expected Outcomes  Short Term: Attend rehab on a regular basis to increase amount of physical activity.       Increase Strength and Stamina  Yes       Intervention  Provide advice, education, support and counseling about physical activity/exercise needs.;Develop an individualized exercise prescription for aerobic and resistive training based on initial evaluation findings, risk stratification, comorbidities and participant's personal goals.       Expected Outcomes  Short Term: Increase workloads from initial exercise prescription for resistance, speed, and METs.       Able to understand and use rate of perceived exertion (RPE) scale  Yes       Intervention  Provide education and explanation on how to use RPE scale       Expected Outcomes  Short Term: Able to use RPE  daily in rehab to express subjective intensity level;Long Term:  Able to use RPE to guide intensity level when exercising independently       Able to understand and use Dyspnea scale  Yes       Intervention  Provide education and explanation on how to use Dyspnea scale       Expected Outcomes  Short Term: Able to use Dyspnea scale daily in rehab to express subjective sense of shortness of breath during exertion;Long Term: Able to use Dyspnea scale to guide intensity level when exercising independently       Knowledge and understanding of Target Heart Rate Range (THRR)  Yes       Intervention  Provide education and explanation of THRR including how the numbers were predicted and where they are located for reference       Expected Outcomes  Short Term: Able to state/look up THRR       Able to check pulse independently  Yes       Intervention  Provide education and demonstration on how to check pulse in carotid and radial arteries.;Review the importance of being able to check your own pulse for safety during independent exercise       Expected Outcomes  Short Term: Able to explain why pulse checking is important during independent exercise;Long Term: Able to check pulse independently and accurately       Understanding of Exercise Prescription  Yes       Intervention  Provide education, explanation, and written materials on patient's individual exercise prescription       Expected Outcomes  Short Term: Able to explain program exercise prescription;Long Term: Able to explain home exercise prescription to exercise independently          Exercise Goals Re-Evaluation : Exercise Goals Re-Evaluation    [place] Name 11/17/18 1553             Exercise Goal Re-Evaluation   Exercise Goals Review  Increase Physical Activity;Able to understand and use rate of perceived exertion (RPE) scale;Increase Strength and Stamina;Able to check pulse independently;Understanding of Exercise Prescription;Knowledge and  understanding of Target Heart Rate Range (THRR)       Comments  Pt. has attended 5 exercise sessions so far. He has tolerated the exercise well. Will continue to monitor and progress as we see fit.        Expected Outcomes  improve heart health.            Discharge Exercise Prescription (Final Exercise Prescription Changes): Exercise Prescription Changes - 11/17/18 1500      Response to Exercise   Blood Pressure (Admit)  130/70    Blood Pressure (Exercise)  132/70    Blood Pressure (Exit)  126/80    Heart Rate (Admit)  64 bpm    Heart Rate (Exercise)  87 bpm    Heart Rate (Exit)  73 bpm    Rating of Perceived Exertion (Exercise)  11    Comments  First two weeks of exercise    Duration  Progress to 30 minutes of  aerobic without signs/symptoms of physical distress    Intensity  THRR unchanged      Resistance Training   Training Prescription  Yes    Weight  2    Reps  10-15      Treadmill   MPH  1.5    Grade  0    Minutes  17    METs  2.1      NuStep   Level  1    SPM  91    Minutes  22    METs  2.3      Home Exercise Plan   Plans to continue exercise at  Home (comment)  Frequency  Add 2 additional days to program exercise sessions.    Initial Home Exercises Provided  11/04/18       Nutrition:  Target Goals: Understanding of nutrition guidelines, daily intake of sodium 1500mg , cholesterol 200mg , calories 30% from fat and 7% or less from saturated fats, daily to have 5 or more servings of fruits and vegetables.  Biometrics: Pre Biometrics - 11/04/18 1041      Pre Biometrics   Height  6\' 1"  (1.854 m)    Waist Circumference  39.5 inches    Hip Circumference  40 inches    Waist to Hip Ratio  0.99 %    Triceps Skinfold  4 mm    % Body Fat  20.9 %    Grip Strength  16.9 kg    Flexibility  0 in    Single Leg Stand  60 seconds        Nutrition Therapy Plan and Nutrition Goals: Nutrition Therapy & Goals - 11/19/18 0851      Nutrition Therapy   RD  appointment deferred  Yes      Personal Nutrition Goals   Comments  Patient hopes to attend next RD appointment. He says he is trying to eat a low NA, Fat diet.       Intervention Plan   Intervention  Nutrition handout(s) given to patient.       Nutrition Assessments: Nutrition Assessments - 11/04/18 1121      MEDFICTS Scores   Pre Score  37       Nutrition Goals Re-Evaluation:   Nutrition Goals Discharge (Final Nutrition Goals Re-Evaluation):   Psychosocial: Target Goals: Acknowledge presence or absence of significant depression and/or stress, maximize coping skills, provide positive support system. Participant is able to verbalize types and ability to use techniques and skills needed for reducing stress and depression.  Initial Review & Psychosocial Screening: Initial Psych Review & Screening - 11/04/18 1112      Initial Review   Current issues with  None Identified      Family Dynamics   Good Support System?  Yes      Barriers   Psychosocial barriers to participate in program  There are no identifiable barriers or psychosocial needs.      Screening Interventions   Interventions  Encouraged to exercise    Expected Outcomes  Short Term goal: Identification and review with participant of any Quality of Life or Depression concerns found by scoring the questionnaire.;Long Term goal: The participant improves quality of Life and PHQ9 Scores as seen by post scores and/or verbalization of changes       Quality of Life Scores: Quality of Life - 11/04/18 1043      Quality of Life   Select  Quality of Life      Quality of Life Scores   Health/Function Pre  27.43 %    Socioeconomic Pre  28 %    Psych/Spiritual Pre  28.21 %    Family Pre  27.6 %    GLOBAL Pre  27.73 %      Scores of 19 and below usually indicate a poorer quality of life in these areas.  A difference of  2-3 points is a clinically meaningful difference.  A difference of 2-3 points in the total score of  the Quality of Life Index has been associated with significant improvement in overall quality of life, self-image, physical symptoms, and general health in studies assessing change in quality of life.  PHQ-9:  Recent Review Flowsheet Data    Depression screen [facility] 2/9 11/04/2018   Decreased Interest 0   Down, Depressed, Hopeless 0   PHQ - 2 Score 0   Altered sleeping 0   Tired, decreased energy 0   Change in appetite 0   Feeling bad or failure about yourself  0   Trouble concentrating 0   Moving slowly or fidgety/restless 0   Suicidal thoughts 0   PHQ-9 Score 0     Interpretation of Total Score  Total Score Depression Severity:  1-4 = Minimal depression, 5-9 = Mild depression, 10-14 = Moderate depression, 15-19 = Moderately severe depression, 20-27 = Severe depression   Psychosocial Evaluation and Intervention: Psychosocial Evaluation - 11/04/18 1115      Psychosocial Evaluation & Interventions   Interventions  Encouraged to exercise with the program and follow exercise prescription    Continue Psychosocial Services   No Follow up required       Psychosocial Re-Evaluation: Psychosocial Re-Evaluation    [place] Name 11/19/18 [phone]             Psychosocial Re-Evaluation   Current issues with  None Identified       Comments  Patient's initial QOL score was 27.73 and his PHQ-9 score was 0 with no psychosocial issues identified.       Expected Outcomes  Patient will have no psychosocial issues identified at discharge.        Interventions  Relaxation education;Stress management education;Encouraged to attend Cardiac Rehabilitation for the exercise       Continue Psychosocial Services   No Follow up required          Psychosocial Discharge (Final Psychosocial Re-Evaluation): Psychosocial Re-Evaluation - 11/19/18 0853      Psychosocial Re-Evaluation   Current issues with  None Identified    Comments  Patient's initial QOL score was 27.73 and his PHQ-9 score was 0 with no  psychosocial issues identified.    Expected Outcomes  Patient will have no psychosocial issues identified at discharge.     Interventions  Relaxation education;Stress management education;Encouraged to attend Cardiac Rehabilitation for the exercise    Continue Psychosocial Services   No Follow up required       Vocational Rehabilitation: Provide vocational rehab assistance to qualifying candidates.   Vocational Rehab Evaluation & Intervention: Vocational Rehab - 11/04/18 1122      Initial Vocational Rehab Evaluation & Intervention   Assessment shows need for Vocational Rehabilitation  No       Education: Education Goals: Education classes will be provided on a weekly basis, covering required topics. Participant will state understanding/return demonstration of topics presented.  Learning Barriers/Preferences: Learning Barriers/Preferences - 11/04/18 1121      Learning Barriers/Preferences   Learning Barriers  None    Learning Preferences  Verbal Instruction;Skilled Demonstration;Individual Instruction       Education Topics: Hypertension, Hypertension Reduction -Define heart disease and high blood pressure. Discus how high blood pressure affects the body and ways to reduce high blood pressure.   Exercise and Your Heart -Discuss why it is important to exercise, the FITT principles of exercise, normal and abnormal responses to exercise, and how to exercise safely.   Angina -Discuss definition of angina, causes of angina, treatment of angina, and how to decrease risk of having angina.   Cardiac Medications -Review what the following cardiac medications are used for, how they affect the body, and side effects that may occur when taking the medications.  Medications include  Aspirin, Beta blockers, calcium channel blockers, ACE Inhibitors, angiotensin receptor blockers, diuretics, digoxin, and antihyperlipidemics.   Congestive Heart Failure -Discuss the definition of CHF, how  to live with CHF, the signs and symptoms of CHF, and how keep track of weight and sodium intake.   Heart Disease and Intimacy -Discus the effect sexual activity has on the heart, how changes occur during intimacy as we age, and safety during sexual activity.   CARDIAC REHAB PHASE II EXERCISE from 11/11/2018 in [place]  Date  11/11/18  Educator  Etheleen Mayhew  Instruction Review Code  2- Demonstrated Understanding      Smoking Cessation / COPD -Discuss different methods to quit smoking, the health benefits of quitting smoking, and the definition of COPD.   Nutrition I: Fats -Discuss the types of cholesterol, what cholesterol does to the heart, and how cholesterol levels can be controlled.   Nutrition II: Labels -Discuss the different components of food labels and how to read food label   Heart Parts/Heart Disease and PAD -Discuss the anatomy of the heart, the pathway of blood circulation through the heart, and these are affected by heart disease.   Stress I: Signs and Symptoms -Discuss the causes of stress, how stress may lead to anxiety and depression, and ways to limit stress.   Stress II: Relaxation -Discuss different types of relaxation techniques to limit stress.   Warning Signs of Stroke / TIA -Discuss definition of a stroke, what the signs and symptoms are of a stroke, and how to identify when someone is having stroke.   Knowledge Questionnaire Score: Knowledge Questionnaire Score - 11/04/18 1122      Knowledge Questionnaire Score   Pre Score  24/28       Core Components/Risk Factors/Patient Goals at Admission: Personal Goals and Risk Factors at Admission - 11/04/18 1123      Core Components/Risk Factors/Patient Goals on Admission    Weight Management  Weight Maintenance    Personal Goal Other  Yes    Personal Goal  To get a good report about his heart.     Intervention  Attend CR 3 x week and supplement with 2 x week at home  exercise.     Expected Outcomes  Achieve personal goals.        Core Components/Risk Factors/Patient Goals Review:  Goals and Risk Factor Review    Row Name 11/19/18 [phone]             Core Components/Risk Factors/Patient Goals Review   Personal Goals Review  Weight Management/Obesity Improve heart; get good new about heart.        Review  Patient is new to the program completeing 6 sessions gaining 2 lbs since his initial visit. He has been progressed and is doing well so far. Will continue to monitor for progress.        Expected Outcomes  Patient will continue to attend sessions and complete the program meeting his personal goals.           Core Components/Risk Factors/Patient Goals at Discharge (Final Review):  Goals and Risk Factor Review - 11/19/18 0852      Core Components/Risk Factors/Patient Goals Review   Personal Goals Review  Weight Management/Obesity   Improve heart; get good new about heart.    Review  Patient is new to the program completeing 6 sessions gaining 2 lbs since his initial visit. He has been progressed and is doing well so far. Will continue to monitor  for progress.     Expected Outcomes  Patient will continue to attend sessions and complete the program meeting his personal goals.        ITP Comments:   Comments: ITP REVIEW Patient doing well in the program. Will continue to monitor for progress.

## 2018-11-20 ENCOUNTER — Encounter (HOSPITAL_COMMUNITY)
Admission: RE | Admit: 2018-11-20 | Discharge: 2018-11-20 | Disposition: A | Discharge status: Home / Self Care | Payer: Medicare HMO | Source: Ambulatory Visit | Attending: Thoracic Surgery (Cardiothoracic Vascular Surgery) | Admitting: Thoracic Surgery (Cardiothoracic Vascular Surgery)

## 2018-11-20 DIAGNOSIS — I214 Non-ST elevation (NSTEMI) myocardial infarction: Secondary | ICD-10-CM | POA: Diagnosis not present

## 2018-11-20 DIAGNOSIS — Z951 Presence of aortocoronary bypass graft: Secondary | ICD-10-CM

## 2018-11-20 NOTE — Progress Notes (Signed)
Daily Session Note  Patient Details  Name: Timothy Simpson MRN: [id number] Date of Birth: Aug 04, 1946 Referring Provider:     [place] from 11/04/2018 in [place]  Referring Provider  Roxy Manns      Encounter Date: 11/20/2018  Check In: Session Check In - 11/20/18 0815      Check-In   Supervising physician immediately available to respond to emergencies  See telemetry face sheet for immediately available MD    Location  [facility]    Staff Present  Aundra Dubin, RN, Cory Munch, Exercise Physiologist;Diane Coad, MS, EP, [facility], Exercise Physiologist    Medication changes reported      No    Fall or balance concerns reported     No    Tobacco Cessation  No Change    Warm-up and Cool-down  Performed as group-led instruction    Resistance Training Performed  Yes    VAD Patient?  No    PAD/SET Patient?  No      Pain Assessment   Currently in Pain?  No/denies    Pain Score  0-No pain    Multiple Pain Sites  No       Capillary Blood Glucose: No results found for this or any previous visit (from the past 24 hour(s)).    Social History   Tobacco Use  Smoking Status Current Every Day Smoker  . Packs/day: 0.25  . Years: 40.00  . Pack years: 10.00  . Types: Cigarettes  Smokeless Tobacco Never Used  Tobacco Comment   he sated that he would quit if he was told he had to.     Goals Met:  Independence with exercise equipment Exercise tolerated well No report of cardiac concerns or symptoms Strength training completed today  Goals Unmet:  Not Applicable  Comments: Pt able to follow exercise prescription today without complaint.  Will continue to monitor for progression. Check out 915.   Dr. Kate Sable is Medical Director for [facility] Cardiac and Pulmonary Rehab.

## 2018-11-23 ENCOUNTER — Encounter (HOSPITAL_COMMUNITY)
Admission: RE | Admit: 2018-11-23 | Discharge: 2018-11-23 | Disposition: A | Discharge status: Home / Self Care | Payer: Medicare HMO | Source: Ambulatory Visit | Attending: Thoracic Surgery (Cardiothoracic Vascular Surgery) | Admitting: Thoracic Surgery (Cardiothoracic Vascular Surgery)

## 2018-11-23 DIAGNOSIS — I214 Non-ST elevation (NSTEMI) myocardial infarction: Secondary | ICD-10-CM

## 2018-11-23 DIAGNOSIS — Z951 Presence of aortocoronary bypass graft: Secondary | ICD-10-CM

## 2018-11-23 NOTE — Progress Notes (Signed)
Daily Session Note  Patient Details  Name: Timothy Simpson MRN: [id number] Date of Birth: Apr 30, 1946 Referring Provider:     [place] from 11/04/2018 in [place]  Referring Provider  Roxy Manns      Encounter Date: 11/23/2018  Check In: Session Check In - 11/23/18 0815      Check-In   Supervising physician immediately available to respond to emergencies  See telemetry face sheet for immediately available MD    Location  [facility]    Staff Present  Aundra Dubin, RN, Cory Munch, Exercise Physiologist;Diane Coad, MS, EP, [facility], Exercise Physiologist    Medication changes reported      No    Fall or balance concerns reported     No    Tobacco Cessation  No Change    Warm-up and Cool-down  Performed as group-led instruction    Resistance Training Performed  Yes    VAD Patient?  No    PAD/SET Patient?  No      Pain Assessment   Currently in Pain?  No/denies    Pain Score  0-No pain    Multiple Pain Sites  No       Capillary Blood Glucose: No results found for this or any previous visit (from the past 24 hour(s)).    Social History   Tobacco Use  Smoking Status Current Every Day Smoker  . Packs/day: 0.25  . Years: 40.00  . Pack years: 10.00  . Types: Cigarettes  Smokeless Tobacco Never Used  Tobacco Comment   he sated that he would quit if he was told he had to.     Goals Met:  Independence with exercise equipment Exercise tolerated well No report of cardiac concerns or symptoms Strength training completed today  Goals Unmet:  Not Applicable  Comments: Pt able to follow exercise prescription today without complaint.  Will continue to monitor for progression. Check out 915.   Dr. Kate Sable is Medical Director for [facility] Cardiac and Pulmonary Rehab.

## 2018-11-25 ENCOUNTER — Encounter (HOSPITAL_COMMUNITY): Payer: Medicare HMO

## 2018-11-27 ENCOUNTER — Encounter (HOSPITAL_COMMUNITY)
Admission: RE | Admit: 2018-11-27 | Discharge: 2018-11-27 | Disposition: A | Discharge status: Home / Self Care | Payer: Medicare HMO | Source: Ambulatory Visit | Attending: Thoracic Surgery (Cardiothoracic Vascular Surgery) | Admitting: Thoracic Surgery (Cardiothoracic Vascular Surgery)

## 2018-11-27 DIAGNOSIS — I214 Non-ST elevation (NSTEMI) myocardial infarction: Secondary | ICD-10-CM | POA: Diagnosis not present

## 2018-11-27 DIAGNOSIS — Z951 Presence of aortocoronary bypass graft: Secondary | ICD-10-CM

## 2018-11-27 NOTE — Progress Notes (Signed)
Daily Session Note  Patient Details  Name: Timothy Simpson MRN: [id number] Date of Birth: 10/08/1946 Referring Provider:     [place] from 11/04/2018 in [place]  Referring Provider  Roxy Manns      Encounter Date: 11/27/2018  Check In: Session Check In - 11/27/18 0905      Check-In   Supervising physician immediately available to respond to emergencies  See telemetry face sheet for immediately available MD    Location  [facility]    Staff Present  Benay Pike, Exercise Physiologist;Diane Coad, MS, EP, [facility], Exercise Physiologist;Other    Medication changes reported      No    Fall or balance concerns reported     No    Tobacco Cessation  No Change    Warm-up and Cool-down  Performed as group-led instruction    Resistance Training Performed  Yes    VAD Patient?  No    PAD/SET Patient?  No      Pain Assessment   Currently in Pain?  No/denies    Pain Score  0-No pain    Multiple Pain Sites  No       Capillary Blood Glucose: No results found for this or any previous visit (from the past 24 hour(s)).    Social History   Tobacco Use  Smoking Status Current Every Day Smoker  . Packs/day: 0.25  . Years: 40.00  . Pack years: 10.00  . Types: Cigarettes  Smokeless Tobacco Never Used  Tobacco Comment   he sated that he would quit if he was told he had to.     Goals Met:  Independence with exercise equipment Exercise tolerated well No report of cardiac concerns or symptoms Strength training completed today  Goals Unmet:  Not Applicable  Comments: Pt able to follow exercise prescription today without complaint.  Will continue to monitor for progression. Check out 0915.   Dr. Kate Sable is Medical Director for [facility] Cardiac and Pulmonary Rehab.

## 2018-11-30 ENCOUNTER — Encounter (HOSPITAL_COMMUNITY)
Admission: RE | Admit: 2018-11-30 | Discharge: 2018-11-30 | Disposition: A | Discharge status: Home / Self Care | Payer: Medicare HMO | Source: Ambulatory Visit | Attending: Thoracic Surgery (Cardiothoracic Vascular Surgery) | Admitting: Thoracic Surgery (Cardiothoracic Vascular Surgery)

## 2018-11-30 DIAGNOSIS — Z951 Presence of aortocoronary bypass graft: Secondary | ICD-10-CM

## 2018-11-30 DIAGNOSIS — I214 Non-ST elevation (NSTEMI) myocardial infarction: Secondary | ICD-10-CM

## 2018-11-30 NOTE — Progress Notes (Signed)
Daily Session Note  Patient Details  Name: Timothy Simpson MRN: [id number] Date of Birth: July 16, 1946 Referring Provider:     [place] from 11/04/2018 in [place]  Referring Provider  Roxy Manns      Encounter Date: 11/30/2018  Check In: Session Check In - 11/30/18 0815      Check-In   Supervising physician immediately available to respond to emergencies  See telemetry face sheet for immediately available MD    Location  [facility]    Staff Present  Benay Pike, Exercise Physiologist;Diane Coad, MS, EP, [facility], Exercise Physiologist;Other    Medication changes reported      No    Fall or balance concerns reported     No    Tobacco Cessation  No Change    Warm-up and Cool-down  Performed as group-led instruction    Resistance Training Performed  Yes    VAD Patient?  No    PAD/SET Patient?  No      Pain Assessment   Currently in Pain?  No/denies    Pain Score  0-No pain    Multiple Pain Sites  No       Capillary Blood Glucose: No results found for this or any previous visit (from the past 24 hour(s)).    Social History   Tobacco Use  Smoking Status Current Every Day Smoker  . Packs/day: 0.25  . Years: 40.00  . Pack years: 10.00  . Types: Cigarettes  Smokeless Tobacco Never Used  Tobacco Comment   he sated that he would quit if he was told he had to.     Goals Met:  Independence with exercise equipment Exercise tolerated well No report of cardiac concerns or symptoms Strength training completed today  Goals Unmet:  Not Applicable  Comments: Pt able to follow exercise prescription today without complaint.  Will continue to monitor for progression. Check out 0915.   Dr. Kate Sable is Medical Director for [facility] Cardiac and Pulmonary Rehab.

## 2018-12-02 ENCOUNTER — Encounter (HOSPITAL_COMMUNITY): Payer: Medicare HMO

## 2018-12-04 ENCOUNTER — Encounter (HOSPITAL_COMMUNITY)
Admission: RE | Admit: 2018-12-04 | Discharge: 2018-12-04 | Disposition: A | Discharge status: Home / Self Care | Payer: Medicare HMO | Source: Ambulatory Visit | Attending: Thoracic Surgery (Cardiothoracic Vascular Surgery) | Admitting: Thoracic Surgery (Cardiothoracic Vascular Surgery)

## 2018-12-04 DIAGNOSIS — I214 Non-ST elevation (NSTEMI) myocardial infarction: Secondary | ICD-10-CM | POA: Insufficient documentation

## 2018-12-04 DIAGNOSIS — Z951 Presence of aortocoronary bypass graft: Secondary | ICD-10-CM | POA: Diagnosis present

## 2018-12-04 NOTE — Progress Notes (Signed)
Daily Session Note  Patient Details  Name: Timothy Simpson MRN: [id number] Date of Birth: [birth year] Referring Provider:     [place] from 11/04/2018 in [place]  Referring Provider  Roxy Manns      Encounter Date: 12/04/2018  Check In: Session Check In - 12/04/18 0815      Check-In   Supervising physician immediately available to respond to emergencies  See telemetry face sheet for immediately available MD    Location  [facility]    Staff Present  Benay Pike, Exercise Physiologist;Diane Coad, MS, EP, [facility], Exercise Physiologist;Debra Wynetta Emery, RN, BSN    Medication changes reported      No    Fall or balance concerns reported     No    Tobacco Cessation  No Change    Warm-up and Cool-down  Performed as group-led instruction    Resistance Training Performed  Yes    VAD Patient?  No    PAD/SET Patient?  No      Pain Assessment   Currently in Pain?  No/denies    Pain Score  0-No pain    Multiple Pain Sites  No       Capillary Blood Glucose: No results found for this or any previous visit (from the past 24 hour(s)).    Social History   Tobacco Use  Smoking Status Current Every Day Smoker  . Packs/day: 0.25  . Years: 40.00  . Pack years: 10.00  . Types: Cigarettes  Smokeless Tobacco Never Used  Tobacco Comment   he sated that he would quit if he was told he had to.     Goals Met:  Independence with exercise equipment Exercise tolerated well No report of cardiac concerns or symptoms Strength training completed today  Goals Unmet:  Not Applicable  Comments: Pt able to follow exercise prescription today without complaint.  Will continue to monitor for progression. Check out 0915.   Dr. Kate Sable is Medical Director for [facility] Cardiac and Pulmonary Rehab.

## 2018-12-07 ENCOUNTER — Encounter (HOSPITAL_COMMUNITY)
Admission: RE | Admit: 2018-12-07 | Discharge: 2018-12-07 | Disposition: A | Discharge status: Home / Self Care | Payer: Medicare HMO | Source: Ambulatory Visit | Attending: Thoracic Surgery (Cardiothoracic Vascular Surgery) | Admitting: Thoracic Surgery (Cardiothoracic Vascular Surgery)

## 2018-12-07 DIAGNOSIS — I214 Non-ST elevation (NSTEMI) myocardial infarction: Secondary | ICD-10-CM | POA: Diagnosis not present

## 2018-12-07 DIAGNOSIS — Z951 Presence of aortocoronary bypass graft: Secondary | ICD-10-CM

## 2018-12-07 NOTE — Progress Notes (Signed)
Daily Session Note  Patient Details  Name: Timothy Simpson MRN: [id number] Date of Birth: 1946/01/09 Referring Provider:     [place] from 11/04/2018 in [place]  Referring Provider  Roxy Manns      Encounter Date: 12/07/2018  Check In: Session Check In - 12/07/18 0815      Check-In   Supervising physician immediately available to respond to emergencies  See telemetry face sheet for immediately available MD    Location  [facility]    Staff Present  Benay Pike, Exercise Physiologist;Diane Coad, MS, EP, [facility], Exercise Physiologist;Ralphael [place] Wynetta Emery, RN, BSN    Medication changes reported      No    Fall or balance concerns reported     No    Tobacco Cessation  No Change    Warm-up and Cool-down  Performed as group-led instruction    Resistance Training Performed  Yes    VAD Patient?  No    PAD/SET Patient?  No      Pain Assessment   Currently in Pain?  No/denies    Pain Score  0-No pain    Multiple Pain Sites  No       Capillary Blood Glucose: No results found for this or any previous visit (from the past 24 hour(s)).    Social History   Tobacco Use  Smoking Status Current Every Day Smoker  . Packs/day: 0.25  . Years: 40.00  . Pack years: 10.00  . Types: Cigarettes  Smokeless Tobacco Never Used  Tobacco Comment   he sated that he would quit if he was told he had to.     Goals Met:  Independence with exercise equipment Exercise tolerated well No report of cardiac concerns or symptoms Strength training completed today  Goals Unmet:  Not Applicable  Comments: Pt able to follow exercise prescription today without complaint.  Will continue to monitor for progression. Check out 915.   Dr. Kate Sable is Medical Director for [facility] Cardiac and Pulmonary Rehab.

## 2018-12-09 ENCOUNTER — Encounter (HOSPITAL_COMMUNITY)
Admission: RE | Admit: 2018-12-09 | Discharge: 2018-12-09 | Disposition: A | Discharge status: Home / Self Care | Payer: Medicare HMO | Source: Ambulatory Visit | Attending: Thoracic Surgery (Cardiothoracic Vascular Surgery) | Admitting: Thoracic Surgery (Cardiothoracic Vascular Surgery)

## 2018-12-09 DIAGNOSIS — Z951 Presence of aortocoronary bypass graft: Secondary | ICD-10-CM

## 2018-12-09 DIAGNOSIS — I214 Non-ST elevation (NSTEMI) myocardial infarction: Secondary | ICD-10-CM | POA: Diagnosis not present

## 2018-12-09 NOTE — Progress Notes (Signed)
Daily Session Note  Patient Details  Name: Timothy Simpson MRN: [id number] Date of Birth: Dec 07, 1945 Referring Provider:     [place] from 11/04/2018 in [place]  Referring Provider  Roxy Manns      Encounter Date: 12/09/2018  Check In: Session Check In - 12/09/18 0815      Check-In   Supervising physician immediately available to respond to emergencies  See telemetry face sheet for immediately available MD    Location  [facility]    Staff Present  Benay Pike, Exercise Physiologist;Diane Coad, MS, EP, [facility], Exercise Physiologist;Debra Wynetta Emery, RN, BSN    Medication changes reported      No    Fall or balance concerns reported     No    Tobacco Cessation  No Change    Warm-up and Cool-down  Performed as group-led instruction    Resistance Training Performed  Yes    VAD Patient?  No    PAD/SET Patient?  No      Pain Assessment   Currently in Pain?  No/denies    Pain Score  0-No pain    Multiple Pain Sites  No       Capillary Blood Glucose: No results found for this or any previous visit (from the past 24 hour(s)).    Social History   Tobacco Use  Smoking Status Current Every Day Smoker  . Packs/day: 0.25  . Years: 40.00  . Pack years: 10.00  . Types: Cigarettes  Smokeless Tobacco Never Used  Tobacco Comment   he sated that he would quit if he was told he had to.     Goals Met:  Independence with exercise equipment Exercise tolerated well No report of cardiac concerns or symptoms Strength training completed today  Goals Unmet:  Not Applicable  Comments: Pt able to follow exercise prescription today without complaint.  Will continue to monitor for progression. Check out 0915.   Dr. Kate Sable is Medical Director for [facility] Cardiac and Pulmonary Rehab.

## 2018-12-10 NOTE — Progress Notes (Signed)
Cardiac Individual Treatment Plan  Patient Details  Name: Timothy Simpson MRN: [id number] Date of Birth: Apr 11, 1946 Referring Provider:     [place] from 11/04/2018 in [place]  Referring Provider  Roxy Manns      Initial Encounter Date:    CARDIAC REHAB PHASE II ORIENTATION from 11/04/2018 in [place]  Date  11/04/18      Visit Diagnosis: NSTEMI (non-ST elevated myocardial infarction) ([place])  S/P CABG x 3  Patient's Home Medications on Admission:  Current Outpatient Medications:  .  allopurinol (ZYLOPRIM) 100 MG tablet, Take 100 mg by mouth daily., Disp: , Rfl:  .  aspirin EC 81 MG EC tablet, Take 1 tablet (81 mg total) by mouth daily., Disp: , Rfl:  .  atorvastatin (LIPITOR) 80 MG tablet, Take 1 tablet (80 mg total) by mouth daily at 6 PM., Disp: 30 tablet, Rfl: 1 .  clopidogrel (PLAVIX) 75 MG tablet, Take 1 tablet (75 mg total) by mouth daily., Disp: 30 tablet, Rfl: 1 .  ferrous sulfate 325 (65 FE) MG EC tablet, Take 1 tablet (325 mg total) by mouth 2 (two) times daily., Disp: 60 tablet, Rfl: 3 .  metoprolol tartrate (LOPRESSOR) 25 MG tablet, Take 1 tablet (25 mg total) by mouth 2 (two) times daily., Disp: 60 tablet, Rfl: 3  Past Medical History: Past Medical History:  Diagnosis Date  . Coronary artery disease   . Gout   . High cholesterol   . Hypertension   . NSTEMI (non-ST elevated myocardial infarction) ([place]) 07/29/2018  . 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 Tobacco abuse     Tobacco Use: Social History   Tobacco Use  Smoking Status Current Every Day Smoker  . Packs/day: 0.25  . Years: 40.00  . Pack years: 10.00  . Types: Cigarettes  Smokeless Tobacco Never Used  Tobacco Comment   he sated that he would quit if he was told he had to.     Labs: Recent Review Flowsheet Data     Labs for ITP Cardiac and Pulmonary Rehab Latest Ref Rng & Units 08/05/2018 08/05/2018 08/05/2018 08/06/2018 08/06/2018   Cholestrol 0 - 200 mg/dL - - - - -   LDLCALC 0 - 99 mg/dL - - - - -   HDL >40 mg/dL - - - - -   Trlycerides <150 mg/dL - - - - -   Hemoglobin A1c 4.8 - 5.6 % - - - - -   PHART 7.350 - 7.450 7.310(L) - 7.304(L) 7.300(L) -   PCO2ART 32.0 - 48.0 mmHg 43.8 - 42.4 41.2 -   HCO3 20.0 - 28.0 mmol/L 22.0 - 21.0 20.1 -   TCO2 22 - 32 mmol/L 23 21(L) 22 21(L) 22   ACIDBASEDEF 0.0 - 2.0 mmol/L 4.0(H) - 5.0(H) 6.0(H) -   O2SAT % 99.0 - 99.0 99.0 -      Capillary Blood Glucose: Lab Results  Component Value Date   GLUCAP 87 08/08/2018   GLUCAP 100 (H) 08/08/2018   GLUCAP 99 08/08/2018   GLUCAP 124 (H) 08/07/2018   GLUCAP 126 (H) 08/07/2018     Exercise Target Goals: Exercise Program Goal: Individual exercise prescription set using results from initial 6 min walk test and THRR while considering  patient's activity barriers and safety.   Exercise Prescription Goal: Starting with aerobic activity 30  plus minutes a day, 3 days per week for initial exercise prescription. Provide home exercise prescription and guidelines that participant acknowledges understanding prior to discharge.  Activity Barriers & Risk Stratification: Activity Barriers & Cardiac Risk Stratification - 11/04/18 1038      Activity Barriers & Cardiac Risk Stratification   Cardiac Risk Stratification  High       6 Minute Walk: 6 Minute Walk    Row Name 11/04/18 1037         6 Minute Walk   Phase  Initial     Distance  1200 feet     Walk Time  6 minutes     # of Rest Breaks  0     MPH  2.27     METS  2.74     RPE  8     Perceived Dyspnea   7     VO2 Peak  10.26     Symptoms  No     Resting HR  73 bpm     Resting BP  148/78     Resting Oxygen Saturation   97 %     Exercise Oxygen Saturation  during 6 min walk  94 %     Max Ex. HR  83 bpm     Max Ex. BP  154/70     2 Minute Post BP  144/78         Oxygen Initial Assessment:   Oxygen Re-Evaluation:   Oxygen Discharge (Final Oxygen Re-Evaluation):   Initial Exercise Prescription: Initial Exercise Prescription - 11/04/18 1000      Date of Initial Exercise RX and Referring Provider   Date  11/04/18    Referring Provider  Roxy Manns    Expected Discharge Date  02/03/19      Treadmill   MPH  1.3    Grade  0    Minutes  17    METs  1.99      NuStep   Level  1    SPM  120    Minutes  22    METs  2.4      Prescription Details   Frequency (times per week)  3    Duration  Progress to 30 minutes of continuous aerobic without signs/symptoms of physical distress      Intensity   THRR 40-80% of Max Heartrate  [phone]    Ratings of Perceived Exertion  11-13    Perceived Dyspnea  0-4      Progression   Progression  Continue to progress workloads to maintain intensity without signs/symptoms of physical distress.      Resistance Training   Training Prescription  Yes    Weight  1    Reps  10-15       Perform Capillary Blood Glucose checks as needed.  Exercise Prescription Changes:  Exercise Prescription Changes    Row Name 11/04/18 1000 11/17/18 1500 12/03/18 0800         Response to Exercise   Blood Pressure (Admit)  148/78  130/70  126/84     Blood Pressure (Exercise)  154/70  132/70  140/88     Blood Pressure (Exit)  144/78  126/80  122/66     Heart Rate (Admit)  73 bpm  64 bpm  75 bpm     Heart Rate (Exercise)  83 bpm  87 bpm  85 bpm     Heart Rate (Exit)  68 bpm  73 bpm  71 bpm  Rating of Perceived Exertion (Exercise)  8  11  11      Perceived Dyspnea (Exercise)  7  -  -     Comments  6 minute walk test  First two weeks of exercise  -     Duration  Progress to 30 minutes of  aerobic without signs/symptoms of physical distress  Progress to 30 minutes of  aerobic without signs/symptoms of physical distress  Continue with 30 min of aerobic exercise without signs/symptoms of physical distress.      Intensity  THRR New [phone]  THRR unchanged  THRR unchanged       Progression   Progression  -  -  Continue to progress workloads to maintain intensity without signs/symptoms of physical distress.     Average METs  -  -  2.36       Resistance Training   Training Prescription  -  Yes  Yes     Weight  -  2  2     Reps  -  10-15  10-15       Treadmill   MPH  -  1.5  2     Grade  -  0  0     Minutes  -  17  17     METs  -  2.1  2.53       NuStep   Level  -  1  1     SPM  -  91  89     Minutes  -  22  22     METs  -  2.3  2.2       Home Exercise Plan   Plans to continue exercise at  Home (comment) walking  Home (comment)  -     Frequency  Add 2 additional days to program exercise sessions.  Add 2 additional days to program exercise sessions.  -     Initial Home Exercises Provided  11/04/18  11/04/18  -        Exercise Comments:  Exercise Comments    Row Name 11/17/18 1554 12/08/18 1539         Exercise Comments  Pt. has done well so far. He has attended 5 exercise sessions and is always willing to do what is asked. Will progress as tolerated.   Pt. continues to do well in the program. He has attended 12 exercise sessions and has tolerated all of his progressions with ease.          Exercise Goals and Review:  Exercise Goals    Row Name 11/04/18 1041             Exercise Goals   Increase Physical Activity  Yes       Intervention  Provide advice, education, support and counseling about physical activity/exercise needs.;Develop an individualized exercise prescription for aerobic and resistive training based on initial evaluation findings, risk stratification, comorbidities and participant's personal goals.       Expected Outcomes  Short Term: Attend rehab on a regular basis to increase amount of physical activity.       Increase Strength and Stamina  Yes       Intervention  Provide advice, education, support and counseling about physical activity/exercise  needs.;Develop an individualized exercise prescription for aerobic and resistive training based on initial evaluation findings, risk stratification, comorbidities and participant's personal goals.       Expected Outcomes  Short Term: Increase workloads from initial exercise prescription  for resistance, speed, and METs.       Able to understand and use rate of perceived exertion (RPE) scale  Yes       Intervention  Provide education and explanation on how to use RPE scale       Expected Outcomes  Short Term: Able to use RPE daily in rehab to express subjective intensity level;Long Term:  Able to use RPE to guide intensity level when exercising independently       Able to understand and use Dyspnea scale  Yes       Intervention  Provide education and explanation on how to use Dyspnea scale       Expected Outcomes  Short Term: Able to use Dyspnea scale daily in rehab to express subjective sense of shortness of breath during exertion;Long Term: Able to use Dyspnea scale to guide intensity level when exercising independently       Knowledge and understanding of Target Heart Rate Range (THRR)  Yes       Intervention  Provide education and explanation of THRR including how the numbers were predicted and where they are located for reference       Expected Outcomes  Short Term: Able to state/look up THRR       Able to check pulse independently  Yes       Intervention  Provide education and demonstration on how to check pulse in carotid and radial arteries.;Review the importance of being able to check your own pulse for safety during independent exercise       Expected Outcomes  Short Term: Able to explain why pulse checking is important during independent exercise;Long Term: Able to check pulse independently and accurately       Understanding of Exercise Prescription  Yes       Intervention  Provide education, explanation, and written materials on patient's individual exercise prescription       Expected  Outcomes  Short Term: Able to explain program exercise prescription;Long Term: Able to explain home exercise prescription to exercise independently          Exercise Goals Re-Evaluation : Exercise Goals Re-Evaluation    Row Name 11/17/18 1553 12/08/18 1537           Exercise Goal Re-Evaluation   Exercise Goals Review  Increase Physical Activity;Able to understand and use rate of perceived exertion (RPE) scale;Increase Strength and Stamina;Able to check pulse independently;Understanding of Exercise Prescription;Knowledge and understanding of Target Heart Rate Range (THRR)  Increase Physical Activity;Able to understand and use rate of perceived exertion (RPE) scale;Increase Strength and Stamina;Able to check pulse independently;Understanding of Exercise Prescription;Knowledge and understanding of Target Heart Rate Range (THRR)      Comments  Pt. has attended 5 exercise sessions so far. He has tolerated the exercise well. Will continue to monitor and progress as we see fit.   Pt. has attended rehab regularly. He has done well in the program and has handled his progressions with ease. We will continue to monitor and increase his workloads as necessary.       Expected Outcomes  improve heart health.   improve heart health.           Discharge Exercise Prescription (Final Exercise Prescription Changes): Exercise Prescription Changes - 12/03/18 0800      Response to Exercise   Blood Pressure (Admit)  126/84    Blood Pressure (Exercise)  140/88    Blood Pressure (Exit)  122/66    Heart Rate (Admit)  75 bpm    Heart Rate (Exercise)  85 bpm    Heart Rate (Exit)  71 bpm    Rating of Perceived Exertion (Exercise)  11    Duration  Continue with 30 min of aerobic exercise without signs/symptoms of physical distress.    Intensity  THRR unchanged      Progression   Progression  Continue to progress workloads to maintain intensity without signs/symptoms of physical distress.    Average METs  2.36       Resistance Training   Training Prescription  Yes    Weight  2    Reps  10-15      Treadmill   MPH  2    Grade  0    Minutes  17    METs  2.53      NuStep   Level  1    SPM  89    Minutes  22    METs  2.2       Nutrition:  Target Goals: Understanding of nutrition guidelines, daily intake of sodium 1500mg , cholesterol 200mg , calories 30% from fat and 7% or less from saturated fats, daily to have 5 or more servings of fruits and vegetables.  Biometrics: Pre Biometrics - 11/04/18 1041      Pre Biometrics   Height  6\' 1"  (1.854 m)    Waist Circumference  39.5 inches    Hip Circumference  40 inches    Waist to Hip Ratio  0.99 %    Triceps Skinfold  4 mm    % Body Fat  20.9 %    Grip Strength  16.9 kg    Flexibility  0 in    Single Leg Stand  60 seconds        Nutrition Therapy Plan and Nutrition Goals: Nutrition Therapy & Goals - 12/10/18 1247      Nutrition Therapy   RD appointment deferred  Yes      Personal Nutrition Goals   Comments  Patient did not attend January RD appointment. He says he has not changed his diet since starting the program.       Intervention Plan   Intervention  Nutrition handout(s) given to patient.       Nutrition Assessments: Nutrition Assessments - 11/04/18 1121      MEDFICTS Scores   Pre Score  37       Nutrition Goals Re-Evaluation:   Nutrition Goals Discharge (Final Nutrition Goals Re-Evaluation):   Psychosocial: Target Goals: Acknowledge presence or absence of significant depression and/or stress, maximize coping skills, provide positive support system. Participant is able to verbalize types and ability to use techniques and skills needed for reducing stress and depression.  Initial Review & Psychosocial Screening: Initial Psych Review & Screening - 11/04/18 1112      Initial Review   Current issues with  None Identified      Family Dynamics   Good Support System?  Yes      Barriers   Psychosocial  barriers to participate in program  There are no identifiable barriers or psychosocial needs.      Screening Interventions   Interventions  Encouraged to exercise    Expected Outcomes  Short Term goal: Identification and review with participant of any Quality of Life or Depression concerns found by scoring the questionnaire.;Long Term goal: The participant improves quality of Life and PHQ9 Scores as seen by post scores and/or verbalization of changes       Quality  of Life Scores: Quality of Life - 11/04/18 1043      Quality of Life   Select  Quality of Life      Quality of Life Scores   Health/Function Pre  27.43 %    Socioeconomic Pre  28 %    Psych/Spiritual Pre  28.21 %    Family Pre  27.6 %    GLOBAL Pre  27.73 %      Scores of 19 and below usually indicate a poorer quality of life in these areas.  A difference of  2-3 points is a clinically meaningful difference.  A difference of 2-3 points in the total score of the Quality of Life Index has been associated with significant improvement in overall quality of life, self-image, physical symptoms, and general health in studies assessing change in quality of life.  PHQ-9: Recent Review Flowsheet Data    Depression screen [facility] 2/9 11/04/2018   Decreased Interest 0   Down, Depressed, Hopeless 0   PHQ - 2 Score 0   Altered sleeping 0   Tired, decreased energy 0   Change in appetite 0   Feeling bad or failure about yourself  0   Trouble concentrating 0   Moving slowly or fidgety/restless 0   Suicidal thoughts 0   PHQ-9 Score 0     Interpretation of Total Score  Total Score Depression Severity:  1-4 = Minimal depression, 5-9 = Mild depression, 10-14 = Moderate depression, 15-19 = Moderately severe depression, 20-27 = Severe depression   Psychosocial Evaluation and Intervention: Psychosocial Evaluation - 11/04/18 1115      Psychosocial Evaluation & Interventions   Interventions  Encouraged to exercise with the program and  follow exercise prescription    Continue Psychosocial Services   No Follow up required       Psychosocial Re-Evaluation: Psychosocial Re-Evaluation    [place] Name 11/19/18 0853 12/10/18 1250           Psychosocial Re-Evaluation   Current issues with  None Identified  None Identified      Comments  Patient's initial QOL score was 27.73 and his PHQ-9 score was 0 with no psychosocial issues identified.  Patient's initial QOL score was 27.73 and his PHQ-9 score was 0 with no psychosocial issues identified.      Expected Outcomes  Patient will have no psychosocial issues identified at discharge.   Patient will have no psychosocial issues identified at discharge.       Interventions  Relaxation education;Stress management education;Encouraged to attend Cardiac Rehabilitation for the exercise  Relaxation education;Stress management education;Encouraged to attend Cardiac Rehabilitation for the exercise      Continue Psychosocial Services   No Follow up required  No Follow up required         Psychosocial Discharge (Final Psychosocial Re-Evaluation): Psychosocial Re-Evaluation - 12/10/18 1250      Psychosocial Re-Evaluation   Current issues with  None Identified    Comments  Patient's initial QOL score was 27.73 and his PHQ-9 score was 0 with no psychosocial issues identified.    Expected Outcomes  Patient will have no psychosocial issues identified at discharge.     Interventions  Relaxation education;Stress management education;Encouraged to attend Cardiac Rehabilitation for the exercise    Continue Psychosocial Services   No Follow up required       Vocational Rehabilitation: Provide vocational rehab assistance to qualifying candidates.   Vocational Rehab Evaluation & Intervention: Vocational Rehab - 11/04/18 1122  Initial Vocational Rehab Evaluation & Intervention   Assessment shows need for Vocational Rehabilitation  No       Education: Education Goals: Education classes  will be provided on a weekly basis, covering required topics. Participant will state understanding/return demonstration of topics presented.  Learning Barriers/Preferences: Learning Barriers/Preferences - 11/04/18 1121      Learning Barriers/Preferences   Learning Barriers  None    Learning Preferences  Verbal Instruction;Skilled Demonstration;Individual Instruction       Education Topics: Hypertension, Hypertension Reduction -Define heart disease and high blood pressure. Discus how high blood pressure affects the body and ways to reduce high blood pressure.   Exercise and Your Heart -Discuss why it is important to exercise, the FITT principles of exercise, normal and abnormal responses to exercise, and how to exercise safely.   Angina -Discuss definition of angina, causes of angina, treatment of angina, and how to decrease risk of having angina.   Cardiac Medications -Review what the following cardiac medications are used for, how they affect the body, and side effects that may occur when taking the medications.  Medications include Aspirin, Beta blockers, calcium channel blockers, ACE Inhibitors, angiotensin receptor blockers, diuretics, digoxin, and antihyperlipidemics.   Congestive Heart Failure -Discuss the definition of CHF, how to live with CHF, the signs and symptoms of CHF, and how keep track of weight and sodium intake.   Heart Disease and Intimacy -Discus the effect sexual activity has on the heart, how changes occur during intimacy as we age, and safety during sexual activity.   CARDIAC REHAB PHASE II EXERCISE from 12/09/2018 in [place]  Date  11/11/18  Educator  Etheleen Mayhew  Instruction Review Code  2- Demonstrated Understanding      Smoking Cessation / COPD -Discuss different methods to quit smoking, the health benefits of quitting smoking, and the definition of COPD.   Nutrition I: Fats -Discuss the types of cholesterol, what  cholesterol does to the heart, and how cholesterol levels can be controlled.   Nutrition II: Labels -Discuss the different components of food labels and how to read food label   CARDIAC REHAB PHASE II EXERCISE from 12/09/2018 in [place]  Date  12/09/18  Educator  DC  Instruction Review Code  2- Demonstrated Understanding      Heart Parts/Heart Disease and PAD -Discuss the anatomy of the heart, the pathway of blood circulation through the heart, and these are affected by heart disease.   CARDIAC REHAB PHASE II EXERCISE from 12/09/2018 in [place]  Date  12/04/18  Educator  Wynetta Emery  Instruction Review Code  2- Demonstrated Understanding      Stress I: Signs and Symptoms -Discuss the causes of stress, how stress may lead to anxiety and depression, and ways to limit stress.   Stress II: Relaxation -Discuss different types of relaxation techniques to limit stress.   Warning Signs of Stroke / TIA -Discuss definition of a stroke, what the signs and symptoms are of a stroke, and how to identify when someone is having stroke.   Knowledge Questionnaire Score: Knowledge Questionnaire Score - 11/04/18 1122      Knowledge Questionnaire Score   Pre Score  24/28       Core Components/Risk Factors/Patient Goals at Admission: Personal Goals and Risk Factors at Admission - 11/04/18 1123      Core Components/Risk Factors/Patient Goals on Admission    Weight Management  Weight Maintenance    Personal Goal Other  Yes    Personal Goal  To get a good report about his heart.     Intervention  Attend CR 3 x week and supplement with 2 x week at home exercise.     Expected Outcomes  Achieve personal goals.        Core Components/Risk Factors/Patient Goals Review:  Goals and Risk Factor Review    Row Name 11/19/18 0852 12/10/18 1248           Core Components/Risk Factors/Patient Goals Review   Personal Goals Review  Weight  Management/Obesity Improve heart; get good new about heart.   Weight Management/Obesity;Tobacco Cessation Improve heart; get back to work; increase strength and stamina.       Review  Patient is new to the program completeing 6 sessions gaining 2 lbs since his initial visit. He has been progressed and is doing well so far. Will continue to monitor for progress.   Patient has completed 13 sessions gaining 2 lbs since last 30 day review. He is doing well in the program with progression. He says he is doing this because this is what the doctor wants him to do. He continues to smoke but is down to 6 cigarettes/day. Will continue to monitor for progress.       Expected Outcomes  Patient will continue to attend sessions and complete the program meeting his personal goals.   Patient will continue to attend sessions and complete the program meeting his personal goals.          Core Components/Risk Factors/Patient Goals at Discharge (Final Review):  Goals and Risk Factor Review - 12/10/18 1248      Core Components/Risk Factors/Patient Goals Review   Personal Goals Review  Weight Management/Obesity;Tobacco Cessation   Improve heart; get back to work; increase strength and stamina.    Review  Patient has completed 13 sessions gaining 2 lbs since last 30 day review. He is doing well in the program with progression. He says he is doing this because this is what the doctor wants him to do. He continues to smoke but is down to 6 cigarettes/day. Will continue to monitor for progress.     Expected Outcomes  Patient will continue to attend sessions and complete the program meeting his personal goals.        ITP Comments:   Comments: ITP REVIEW Patient doing well in the program. Will continue to monitor for progress.

## 2018-12-11 ENCOUNTER — Encounter (HOSPITAL_COMMUNITY)
Admission: RE | Admit: 2018-12-11 | Discharge: 2018-12-11 | Disposition: A | Discharge status: Home / Self Care | Payer: Medicare HMO | Source: Ambulatory Visit | Attending: Thoracic Surgery (Cardiothoracic Vascular Surgery) | Admitting: Thoracic Surgery (Cardiothoracic Vascular Surgery)

## 2018-12-11 DIAGNOSIS — I214 Non-ST elevation (NSTEMI) myocardial infarction: Secondary | ICD-10-CM | POA: Diagnosis not present

## 2018-12-11 DIAGNOSIS — Z951 Presence of aortocoronary bypass graft: Secondary | ICD-10-CM

## 2018-12-11 NOTE — Progress Notes (Signed)
Daily Session Note  Patient Details  Name: Timothy Simpson MRN: [id number] Date of Birth: 08/07/1946 Referring Provider:     CARDIAC REHAB PHASE II ORIENTATION from 11/04/2018 in [place] [facility]  Referring Provider  [place]      Encounter Date: 12/11/2018  Check In: Session Check In - 12/11/18 0815      Check-In   Supervising physician immediately available to respond to emergencies  See telemetry face sheet for immediately available MD    Location  [facility]    Staff Present   , Exercise Physiologist;Diane Coad, MS, EP, CH[facility], Exercise Physiologist;Debra Johnson, RN, BSN    Medication changes reported      No    Fall or balance concerns reported     No    Tobacco Cessation  No Change    Warm-up and Cool-down  Performed as group-led instruction    Resistance Training Performed  Yes    VAD Patient?  No    PAD/SET Patient?  No      Pain Assessment   Currently in Pain?  No/denies    Pain Score  0-No pain    Multiple Pain Sites  No       Capillary Blood Glucose: No results found for this or any previous visit (from the past 24 hour(s)).    Social History   Tobacco Use  Smoking Status Current Every Day Smoker  . Packs/day: 0.25  . Years: 40.00  . Pack years: 10.00  . Types: Cigarettes  Smokeless Tobacco Never Used  Tobacco Comment   he sated that he would quit if he was told he had to.     Goals Met:  Independence with exercise equipment Exercise tolerated well No report of cardiac concerns or symptoms Strength training completed today  Goals Unmet:  Not Applicable  Comments: Pt able to follow exercise prescription today without complaint.  Will continue to monitor for progression. Check out 0915.   Dr. Suresh Koneswaran is Medical Director for [place] [facility]. 

## 2018-12-14 ENCOUNTER — Encounter (HOSPITAL_COMMUNITY)
Admission: RE | Admit: 2018-12-14 | Discharge: 2018-12-14 | Disposition: A | Discharge status: Home / Self Care | Payer: Medicare HMO | Source: Ambulatory Visit | Attending: Thoracic Surgery (Cardiothoracic Vascular Surgery) | Admitting: Thoracic Surgery (Cardiothoracic Vascular Surgery)

## 2018-12-14 DIAGNOSIS — Z951 Presence of aortocoronary bypass graft: Secondary | ICD-10-CM

## 2018-12-14 DIAGNOSIS — I214 Non-ST elevation (NSTEMI) myocardial infarction: Secondary | ICD-10-CM | POA: Diagnosis not present

## 2018-12-14 NOTE — Progress Notes (Signed)
Daily Session Note  Patient Details  Name: Timothy Simpson MRN: [id number] Date of Birth: [birth year] Referring Provider:     [place] from 11/04/2018 in [place]  Referring Provider  Roxy Manns      Encounter Date: 12/14/2018  Check In: Session Check In - 12/14/18 0815      Check-In   Supervising physician immediately available to respond to emergencies  See telemetry face sheet for immediately available MD    Location  [facility]    Staff Present  Benay Pike, Exercise Physiologist;Debra Wynetta Emery, RN, BSN;Other    Medication changes reported      No    Fall or balance concerns reported     No    Tobacco Cessation  No Change    Warm-up and Cool-down  Performed as group-led instruction    Resistance Training Performed  Yes    VAD Patient?  No    PAD/SET Patient?  No      Pain Assessment   Currently in Pain?  No/denies    Pain Score  0-No pain    Multiple Pain Sites  No       Capillary Blood Glucose: No results found for this or any previous visit (from the past 24 hour(s)).    Social History   Tobacco Use  Smoking Status Current Every Day Smoker  . Packs/day: 0.25  . Years: 40.00  . Pack years: 10.00  . Types: Cigarettes  Smokeless Tobacco Never Used  Tobacco Comment   he sated that he would quit if he was told he had to.     Goals Met:  Independence with exercise equipment Exercise tolerated well No report of cardiac concerns or symptoms Strength training completed today  Goals Unmet:  Not Applicable  Comments: Pt able to follow exercise prescription today without complaint.  Will continue to monitor for progression. Check out 0915.   Dr. Kate Sable is Medical Director for [facility] Card[facility] and Pulmonary Re[facility].

## 2018-12-16 ENCOUNTER — Encounter (HOSPITAL_COMMUNITY)
Admission: RE | Admit: 2018-12-16 | Discharge: 2018-12-16 | Disposition: A | Discharge status: Home / Self Care | Payer: Medicare HMO | Source: Ambulatory Visit | Attending: Thoracic Surgery (Cardiothoracic Vascular Surgery) | Admitting: Thoracic Surgery (Cardiothoracic Vascular Surgery)

## 2018-12-16 DIAGNOSIS — I214 Non-ST elevation (NSTEMI) myocardial infarction: Secondary | ICD-10-CM

## 2018-12-16 DIAGNOSIS — Z951 Presence of aortocoronary bypass graft: Secondary | ICD-10-CM

## 2018-12-16 NOTE — Progress Notes (Signed)
Daily Session Note  Patient Details  Name: Timothy Simpson MRN: [id number] Date of Birth: 06/03/1946 Referring Provider:     CARDIAC REHAB PHASE II ORIENTATION from 11/04/2018 in [place] [facility]  Referring Provider  [place]      Encounter Date: 12/16/2018  Check In: Session Check In - 12/16/18 0815      Check-In   Supervising physician immediately available to respond to emergencies  See telemetry face sheet for immediately available MD    Location  [facility]    Staff Present   , Exercise Physiologist;Debra Johnson, RN, BSN;Other    Medication changes reported      No    Fall or balance concerns reported     No    Tobacco Cessation  No Change    Warm-up and Cool-down  Performed as group-led instruction    Resistance Training Performed  Yes    VAD Patient?  No    PAD/SET Patient?  No      Pain Assessment   Currently in Pain?  No/denies    Pain Score  0-No pain    Multiple Pain Sites  No       Capillary Blood Glucose: No results found for this or any previous visit (from the past 24 hour(s)).    Social History   Tobacco Use  Smoking Status Current Every Day Smoker  . Packs/day: 0.25  . Years: 40.00  . Pack years: 10.00  . Types: Cigarettes  Smokeless Tobacco Never Used  Tobacco Comment   he sated that he would quit if he was told he had to.     Goals Met:  Independence with exercise equipment Exercise tolerated well No report of cardiac concerns or symptoms Strength training completed today  Goals Unmet:  Not Applicable  Comments: Pt able to follow exercise prescription today without complaint.  Will continue to monitor for progression. Check out 0915.   Dr. Suresh Koneswaran is Medical Director for [place] [facility]. 

## 2018-12-18 ENCOUNTER — Encounter (HOSPITAL_COMMUNITY)
Admission: RE | Admit: 2018-12-18 | Discharge: 2018-12-18 | Disposition: A | Discharge status: Home / Self Care | Payer: Medicare HMO | Source: Ambulatory Visit | Attending: Thoracic Surgery (Cardiothoracic Vascular Surgery) | Admitting: Thoracic Surgery (Cardiothoracic Vascular Surgery)

## 2018-12-18 DIAGNOSIS — I214 Non-ST elevation (NSTEMI) myocardial infarction: Secondary | ICD-10-CM

## 2018-12-18 DIAGNOSIS — Z951 Presence of aortocoronary bypass graft: Secondary | ICD-10-CM

## 2018-12-18 NOTE — Progress Notes (Signed)
Daily Session Note  Patient Details  Name: Timothy Simpson MRN: [id number] Date of Birth: 1946-08-11 Referring Provider:     [place] from 11/04/2018 in [place]  Referring Provider  Roxy Manns      Encounter Date: 12/18/2018  Check In: Session Check In - 12/18/18 0815      Check-In   Supervising physician immediately available to respond to emergencies  See telemetry face sheet for immediately available MD    Location  [facility]    Staff Present  Benay Pike, Exercise Physiologist;Dontae Minerva Wynetta Emery, RN, BSN    Medication changes reported      No    Fall or balance concerns reported     No    Tobacco Cessation  No Change    Warm-up and Cool-down  Performed as group-led instruction    Resistance Training Performed  Yes    VAD Patient?  No    PAD/SET Patient?  No      Pain Assessment   Currently in Pain?  No/denies    Pain Score  0-No pain    Multiple Pain Sites  No       Capillary Blood Glucose: No results found for this or any previous visit (from the past 24 hour(s)).    Social History   Tobacco Use  Smoking Status Current Every Day Smoker  . Packs/day: 0.25  . Years: 40.00  . Pack years: 10.00  . Types: Cigarettes  Smokeless Tobacco Never Used  Tobacco Comment   he sated that he would quit if he was told he had to.     Goals Met:  Independence with exercise equipment Exercise tolerated well No report of cardiac concerns or symptoms Strength training completed today  Goals Unmet:  Not Applicable  Comments: Pt able to follow exercise prescription today without complaint.  Will continue to monitor for progression. Check out 915.   Dr. Kate Sable is Medical Director for [facility] Card[facility] and Pulmonary Rehab.

## 2018-12-21 ENCOUNTER — Encounter (HOSPITAL_COMMUNITY)
Admission: RE | Admit: 2018-12-21 | Discharge: 2018-12-21 | Disposition: A | Discharge status: Home / Self Care | Payer: Medicare HMO | Source: Ambulatory Visit | Attending: Thoracic Surgery (Cardiothoracic Vascular Surgery) | Admitting: Thoracic Surgery (Cardiothoracic Vascular Surgery)

## 2018-12-21 DIAGNOSIS — I214 Non-ST elevation (NSTEMI) myocardial infarction: Secondary | ICD-10-CM

## 2018-12-21 DIAGNOSIS — Z951 Presence of aortocoronary bypass graft: Secondary | ICD-10-CM

## 2018-12-21 NOTE — Progress Notes (Signed)
Daily Session Note  Patient Details  Name: Timothy Simpson MRN: [id number] Date of Birth: [birth year] Referring Provider:     CARDIAC REHAB PHASE II ORIENTATION from 11/04/2018 in [place]  Referring Provider  Roxy Manns      Encounter Date: 12/21/2018  Check In: Session Check In - 12/21/18 0815      Check-In   Supervising physician immediately available to respond to emergencies  See telemetry face sheet for immediately available MD    Location  [facility]    Staff Present  Benay Pike, Exercise Physiologist;Debra Wynetta Emery, RN, BSN    Medication changes reported      No    Fall or balance concerns reported     No    Tobacco Cessation  No Change    Warm-up and Cool-down  Performed as group-led instruction    Resistance Training Performed  Yes    VAD Patient?  No    PAD/SET Patient?  No      Pain Assessment   Currently in Pain?  No/denies    Pain Score  0-No pain    Multiple Pain Sites  No       Capillary Blood Glucose: No results found for this or any previous visit (from the past 24 hour(s)).    Social History   Tobacco Use  Smoking Status Current Every Day Smoker  . Packs/day: 0.25  . Years: 40.00  . Pack years: 10.00  . Types: Cigarettes  Smokeless Tobacco Never Used  Tobacco Comment   he sated that he would quit if he was told he had to.     Goals Met:  Independence with exercise equipment Exercise tolerated well No report of cardiac concerns or symptoms Strength training completed today  Goals Unmet:  Not Applicable  Comments: Pt able to follow exercise prescription today without complaint.  Will continue to monitor for progression. Check out 0915.   Dr. Kate Sable is Medical Director for [facility] Cardiac and Pulmonary Rehab.

## 2018-12-23 ENCOUNTER — Encounter (HOSPITAL_COMMUNITY)
Admission: RE | Admit: 2018-12-23 | Discharge: 2018-12-23 | Disposition: A | Discharge status: Home / Self Care | Payer: Medicare HMO | Source: Ambulatory Visit | Attending: Thoracic Surgery (Cardiothoracic Vascular Surgery) | Admitting: Thoracic Surgery (Cardiothoracic Vascular Surgery)

## 2018-12-23 DIAGNOSIS — I214 Non-ST elevation (NSTEMI) myocardial infarction: Secondary | ICD-10-CM

## 2018-12-23 DIAGNOSIS — Z951 Presence of aortocoronary bypass graft: Secondary | ICD-10-CM

## 2018-12-23 NOTE — Progress Notes (Signed)
Daily Session Note  Patient Details  Name: Timothy Simpson MRN: [id number] Date of Birth: Jul 03, 1946 Referring Provider:     [place] from 11/04/2018 in [place]  Referring Provider  Roxy Manns      Encounter Date: 12/23/2018  Check In: Session Check In - 12/23/18 0815      Check-In   Supervising physician immediately available to respond to emergencies  See telemetry face sheet for immediately available MD    Location  [facility]    Staff Present  Benay Pike, Exercise Physiologist;Debra Wynetta Emery, RN, BSN;Diane Coad, MS, EP, [facility], Exercise Physiologist    Medication changes reported      No    Fall or balance concerns reported     No    Tobacco Cessation  No Change    Warm-up and Cool-down  Performed as group-led instruction    Resistance Training Performed  Yes    VAD Patient?  No    PAD/SET Patient?  No      Pain Assessment   Currently in Pain?  No/denies    Pain Score  0-No pain    Multiple Pain Sites  No       Capillary Blood Glucose: No results found for this or any previous visit (from the past 24 hour(s)).    Social History   Tobacco Use  Smoking Status Current Every Day Smoker  . Packs/day: 0.25  . Years: 40.00  . Pack years: 10.00  . Types: Cigarettes  Smokeless Tobacco Never Used  Tobacco Comment   he sated that he would quit if he was told he had to.     Goals Met:  Independence with exercise equipment Exercise tolerated well No report of cardiac concerns or symptoms Strength training completed today  Goals Unmet:  Not Applicable  Comments: Pt able to follow exercise prescription today without complaint.  Will continue to monitor for progression. Check out 0915.   Dr. Kate Sable is Medical Director for [facility] Cardiac and Pulmonary Rehab.

## 2018-12-25 ENCOUNTER — Encounter (HOSPITAL_COMMUNITY)
Admission: RE | Admit: 2018-12-25 | Discharge: 2018-12-25 | Disposition: A | Discharge status: Home / Self Care | Payer: Medicare HMO | Source: Ambulatory Visit | Attending: Thoracic Surgery (Cardiothoracic Vascular Surgery) | Admitting: Thoracic Surgery (Cardiothoracic Vascular Surgery)

## 2018-12-25 DIAGNOSIS — I214 Non-ST elevation (NSTEMI) myocardial infarction: Secondary | ICD-10-CM

## 2018-12-25 DIAGNOSIS — Z951 Presence of aortocoronary bypass graft: Secondary | ICD-10-CM

## 2018-12-25 NOTE — Progress Notes (Signed)
Daily Session Note  Patient Details  Name: Timothy Simpson MRN: [id number] Date of Birth: November 29, 1946 Referring Provider:     [place] from 11/04/2018 in [place]  Referring Provider  Roxy Manns      Encounter Date: 12/25/2018  Check In: Session Check In - 12/25/18 0815      Check-In   Supervising physician immediately available to respond to emergencies  See telemetry face sheet for immediately available MD    Location  [facility]    Staff Present  Benay Pike, Exercise Physiologist;Diane Coad, MS, EP, [facility], Exercise Physiologist    Medication changes reported      No    Fall or balance concerns reported     No    Tobacco Cessation  No Change    Warm-up and Cool-down  Performed as group-led instruction    Resistance Training Performed  Yes    VAD Patient?  No    PAD/SET Patient?  No      Pain Assessment   Currently in Pain?  No/denies    Pain Score  0-No pain    Multiple Pain Sites  No       Capillary Blood Glucose: No results found for this or any previous visit (from the past 24 hour(s)).    Social History   Tobacco Use  Smoking Status Current Every Day Smoker  . Packs/day: 0.25  . Years: 40.00  . Pack years: 10.00  . Types: Cigarettes  Smokeless Tobacco Never Used  Tobacco Comment   he sated that he would quit if he was told he had to.     Goals Met:  Independence with exercise equipment Exercise tolerated well No report of cardiac concerns or symptoms  Goals Unmet:  Not Applicable  Comments: Pt able to follow exercise prescription today without complaint.  Will continue to monitor for progression. Check out 0915.   Dr. Kate Sable is Medical Director for [facility] Cardiac and Pulmonary Rehab.

## 2018-12-28 ENCOUNTER — Encounter (HOSPITAL_COMMUNITY)
Admission: RE | Admit: 2018-12-28 | Discharge: 2018-12-28 | Disposition: A | Discharge status: Home / Self Care | Payer: Medicare HMO | Source: Ambulatory Visit | Attending: Thoracic Surgery (Cardiothoracic Vascular Surgery) | Admitting: Thoracic Surgery (Cardiothoracic Vascular Surgery)

## 2018-12-28 DIAGNOSIS — I214 Non-ST elevation (NSTEMI) myocardial infarction: Secondary | ICD-10-CM | POA: Diagnosis not present

## 2018-12-28 DIAGNOSIS — Z951 Presence of aortocoronary bypass graft: Secondary | ICD-10-CM

## 2018-12-28 NOTE — Progress Notes (Signed)
Daily Session Note  Patient Details  Name: Timothy Simpson MRN: [id number] Date of Birth: 1946/08/14 Referring Provider:     [place] from 11/04/2018 in [place]  Referring Provider  Roxy Manns      Encounter Date: 12/28/2018  Check In: Session Check In - 12/28/18 0815      Check-In   Supervising physician immediately available to respond to emergencies  See telemetry face sheet for immediately available MD    Location  [facility]    Staff Present  Benay Pike, Exercise Physiologist;Diane Coad, MS, EP, [facility], Exercise Physiologist;Debra Wynetta Emery, RN, BSN    Medication changes reported      No    Fall or balance concerns reported     No    Tobacco Cessation  No Change    Warm-up and Cool-down  Performed as group-led instruction    Resistance Training Performed  Yes    VAD Patient?  No    PAD/SET Patient?  No      Pain Assessment   Currently in Pain?  No/denies    Pain Score  0-No pain    Multiple Pain Sites  No       Capillary Blood Glucose: No results found for this or any previous visit (from the past 24 hour(s)).    Social History   Tobacco Use  Smoking Status Current Every Day Smoker  . Packs/day: 0.25  . Years: 40.00  . Pack years: 10.00  . Types: Cigarettes  Smokeless Tobacco Never Used  Tobacco Comment   he sated that he would quit if he was told he had to.     Goals Met:  Independence with exercise equipment Exercise tolerated well No report of cardiac concerns or symptoms Strength training completed today  Goals Unmet:  Not Applicable  Comments: Pt able to follow exercise prescription today without complaint.  Will continue to monitor for progression. Check out 0915.   Dr. Kate Sable is Medical Director for [facility] Cardiac and Pulmonary Rehab.

## 2018-12-30 ENCOUNTER — Encounter (HOSPITAL_COMMUNITY)
Admission: RE | Admit: 2018-12-30 | Discharge: 2018-12-30 | Disposition: A | Discharge status: Home / Self Care | Payer: Medicare HMO | Source: Ambulatory Visit | Attending: Thoracic Surgery (Cardiothoracic Vascular Surgery) | Admitting: Thoracic Surgery (Cardiothoracic Vascular Surgery)

## 2018-12-30 DIAGNOSIS — I214 Non-ST elevation (NSTEMI) myocardial infarction: Secondary | ICD-10-CM | POA: Diagnosis not present

## 2018-12-30 DIAGNOSIS — Z951 Presence of aortocoronary bypass graft: Secondary | ICD-10-CM

## 2018-12-30 NOTE — Progress Notes (Signed)
Daily Session Note  Patient Details  Name: Timothy Simpson MRN: [id number] Date of Birth: February 05, 1946 Referring Provider:     [place] from 11/04/2018 in [place]  Referring Provider  Roxy Manns      Encounter Date: 12/30/2018  Check In: Session Check In - 12/30/18 0815      Check-In   Supervising physician immediately available to respond to emergencies  See telemetry face sheet for immediately available MD    Location  [facility]    Staff Present  Benay Pike, Exercise Physiologist;Diane Coad, MS, EP, [facility], Exercise Physiologist;Debra Wynetta Emery, RN, BSN    Medication changes reported      No    Fall or balance concerns reported     No    Tobacco Cessation  No Change    Warm-up and Cool-down  Performed as group-led instruction    Resistance Training Performed  Yes    VAD Patient?  No    PAD/SET Patient?  No      Pain Assessment   Currently in Pain?  No/denies    Pain Score  0-No pain    Multiple Pain Sites  No       Capillary Blood Glucose: No results found for this or any previous visit (from the past 24 hour(s)).    Social History   Tobacco Use  Smoking Status Current Every Day Smoker  . Packs/day: 0.25  . Years: 40.00  . Pack years: 10.00  . Types: Cigarettes  Smokeless Tobacco Never Used  Tobacco Comment   he sated that he would quit if he was told he had to.     Goals Met:  Proper associated with RPD/PD & O2 Sat Independence with exercise equipment Exercise tolerated well No report of cardiac concerns or symptoms Strength training completed today  Goals Unmet:  Not Applicable  Comments: Pt able to follow exercise prescription today without complaint.  Will continue to monitor for progression. Check out 0915.   Dr. Kate Sable is Medical Director for [facility] Cardiac and Pulmonary Rehab.

## 2019-01-01 ENCOUNTER — Encounter (HOSPITAL_COMMUNITY)
Admission: RE | Admit: 2019-01-01 | Discharge: 2019-01-01 | Disposition: A | Discharge status: Home / Self Care | Payer: Medicare HMO | Source: Ambulatory Visit | Attending: Thoracic Surgery (Cardiothoracic Vascular Surgery) | Admitting: Thoracic Surgery (Cardiothoracic Vascular Surgery)

## 2019-01-01 DIAGNOSIS — Z951 Presence of aortocoronary bypass graft: Secondary | ICD-10-CM

## 2019-01-01 DIAGNOSIS — I214 Non-ST elevation (NSTEMI) myocardial infarction: Secondary | ICD-10-CM

## 2019-01-01 NOTE — Progress Notes (Signed)
Daily Session Note  Patient Details  Name: MANA MORISON MRN: [id number] Date of Birth: [birth year] Referring Provider:     [place] from 11/04/2018 in [place]  Referring Provider  Roxy Manns      Encounter Date: 01/01/2019  Check In: Session Check In - 01/01/19 0815      Check-In   Supervising physician immediately available to respond to emergencies  See telemetry face sheet for immediately available MD    Location  [facility]    Staff Present  Benay Pike, Exercise Physiologist;Diane Coad, MS, EP, [facility], Exercise Physiologist;Other    Medication changes reported      No    Fall or balance concerns reported     No    Tobacco Cessation  No Change    Warm-up and Cool-down  Performed as group-led instruction    Resistance Training Performed  Yes    VAD Patient?  No    PAD/SET Patient?  No      Pain Assessment   Currently in Pain?  No/denies    Pain Score  0-No pain    Multiple Pain Sites  No       Capillary Blood Glucose: No results found for this or any previous visit (from the past 24 hour(s)).    Social History   Tobacco Use  Smoking Status Current Every Day Smoker  . Packs/day: 0.25  . Years: 40.00  . Pack years: 10.00  . Types: Cigarettes  Smokeless Tobacco Never Used  Tobacco Comment   he sated that he would quit if he was told he had to.     Goals Met:  Proper associated with RPD/PD & O2 Sat Independence with exercise equipment Exercise tolerated well No report of cardiac concerns or symptoms Strength training completed today  Goals Unmet:  Not Applicable  Comments: Pt able to follow exercise prescription today without complaint.  Will continue to monitor for progression. Check out 0915.   Dr. Kate Sable is Medical Director for [facility] Cardiac and Pulmonary Rehab.

## 2019-01-04 ENCOUNTER — Encounter (HOSPITAL_COMMUNITY)
Admission: RE | Admit: 2019-01-04 | Discharge: 2019-01-04 | Disposition: A | Discharge status: Home / Self Care | Payer: Medicare HMO | Source: Ambulatory Visit | Attending: Thoracic Surgery (Cardiothoracic Vascular Surgery) | Admitting: Thoracic Surgery (Cardiothoracic Vascular Surgery)

## 2019-01-04 DIAGNOSIS — I214 Non-ST elevation (NSTEMI) myocardial infarction: Secondary | ICD-10-CM

## 2019-01-04 DIAGNOSIS — Z951 Presence of aortocoronary bypass graft: Secondary | ICD-10-CM | POA: Insufficient documentation

## 2019-01-04 NOTE — Progress Notes (Signed)
Daily Session Note  Patient Details  Name: Timothy Simpson MRN: [id number] Date of Birth: 1946/09/06 Referring Provider:     [place] from 11/04/2018 in [place]  Referring Provider  Roxy Manns      Encounter Date: 01/04/2019  Check In: Session Check In - 01/04/19 0815      Check-In   Supervising physician immediately available to respond to emergencies  See telemetry face sheet for immediately available MD    Location  [facility]    Staff Present  Benay Pike, Exercise Physiologist;Debra Wynetta Emery, RN, BSN    Medication changes reported      No    Fall or balance concerns reported     No    Tobacco Cessation  No Change    Warm-up and Cool-down  Performed as group-led instruction    Resistance Training Performed  Yes    VAD Patient?  No    PAD/SET Patient?  No      Pain Assessment   Currently in Pain?  No/denies    Pain Score  0-No pain    Multiple Pain Sites  No       Capillary Blood Glucose: No results found for this or any previous visit (from the past 24 hour(s)).    Social History   Tobacco Use  Smoking Status Current Every Day Smoker  . Packs/day: 0.25  . Years: 40.00  . Pack years: 10.00  . Types: Cigarettes  Smokeless Tobacco Never Used  Tobacco Comment   he sated that he would quit if he was told he had to.     Goals Met:  Proper associated with RPD/PD & O2 Sat Independence with exercise equipment Exercise tolerated well No report of cardiac concerns or symptoms Strength training completed today  Goals Unmet:  Not Applicable  Comments: Pt able to follow exercise prescription today without complaint.  Will continue to monitor for progression. Check out 0915.   Dr. Kate Sable is Medical Director for [facility] Cardiac and Pulmonary Rehab.

## 2019-01-06 ENCOUNTER — Encounter (HOSPITAL_COMMUNITY)
Admission: RE | Admit: 2019-01-06 | Discharge: 2019-01-06 | Disposition: A | Discharge status: Home / Self Care | Payer: Medicare HMO | Source: Ambulatory Visit | Attending: Thoracic Surgery (Cardiothoracic Vascular Surgery) | Admitting: Thoracic Surgery (Cardiothoracic Vascular Surgery)

## 2019-01-06 DIAGNOSIS — I214 Non-ST elevation (NSTEMI) myocardial infarction: Secondary | ICD-10-CM

## 2019-01-06 DIAGNOSIS — Z951 Presence of aortocoronary bypass graft: Secondary | ICD-10-CM

## 2019-01-06 NOTE — Progress Notes (Signed)
Daily Session Note  Patient Details  Name: Timothy Simpson MRN: [id number] Date of Birth: [birth year] Referring Provider:     [place] from 11/04/2018 in [place]  Referring Provider  Roxy Manns      Encounter Date: 01/06/2019  Check In: Session Check In - 01/06/19 3382      Check-In   Supervising physician immediately available to respond to emergencies  See telemetry face sheet for immediately available MD    Location  [facility]    Staff Present  Benay Pike, Exercise Physiologist;Debra Wynetta Emery, RN, BSN;Diane Coad, MS, EP, [facility], Exercise Physiologist    Medication changes reported      No    Fall or balance concerns reported     No    Tobacco Cessation  No Change    Warm-up and Cool-down  Performed as group-led instruction    Resistance Training Performed  Yes    VAD Patient?  No    PAD/SET Patient?  No      Pain Assessment   Currently in Pain?  No/denies    Pain Score  0-No pain    Multiple Pain Sites  No       Capillary Blood Glucose: No results found for this or any previous visit (from the past 24 hour(s)).    Social History   Tobacco Use  Smoking Status Current Every Day Smoker  . Packs/day: 0.25  . Years: 40.00  . Pack years: 10.00  . Types: Cigarettes  Smokeless Tobacco Never Used  Tobacco Comment   he sated that he would quit if he was told he had to.     Goals Met:  Independence with exercise equipment Exercise tolerated well No report of cardiac concerns or symptoms Strength training completed today  Goals Unmet:  Not Applicable  Comments: Pt able to follow exercise prescription today without complaint.  Will continue to monitor for progression. Check out 0915.   Dr. Kate Sable is Medical Director for [facility] Cardiac and Pulmonary Rehab.

## 2019-01-07 NOTE — Progress Notes (Signed)
Cardiac Individual Treatment Plan  Patient Details  Name: Timothy Simpson MRN: [id number] Date of Birth: Apr 11, 1946 Referring Provider:     [place] from 11/04/2018 in [place]  Referring Provider  Roxy Manns      Initial Encounter Date:    CARDIAC REHAB PHASE II ORIENTATION from 11/04/2018 in [place]  Date  11/04/18      Visit Diagnosis: NSTEMI (non-ST elevated myocardial infarction) ([place])  S/P CABG x 3  Patient's Home Medications on Admission:  Current Outpatient Medications:  .  allopurinol (ZYLOPRIM) 100 MG tablet, Take 100 mg by mouth daily., Disp: , Rfl:  .  aspirin EC 81 MG EC tablet, Take 1 tablet (81 mg total) by mouth daily., Disp: , Rfl:  .  atorvastatin (LIPITOR) 80 MG tablet, Take 1 tablet (80 mg total) by mouth daily at 6 PM., Disp: 30 tablet, Rfl: 1 .  clopidogrel (PLAVIX) 75 MG tablet, Take 1 tablet (75 mg total) by mouth daily., Disp: 30 tablet, Rfl: 1 .  ferrous sulfate 325 (65 FE) MG EC tablet, Take 1 tablet (325 mg total) by mouth 2 (two) times daily., Disp: 60 tablet, Rfl: 3 .  metoprolol tartrate (LOPRESSOR) 25 MG tablet, Take 1 tablet (25 mg total) by mouth 2 (two) times daily., Disp: 60 tablet, Rfl: 3  Past Medical History: Past Medical History:  Diagnosis Date  . Coronary artery disease   . Gout   . High cholesterol   . Hypertension   . NSTEMI (non-ST elevated myocardial infarction) ([place]) 07/29/2018  . 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 Tobacco abuse     Tobacco Use: Social History   Tobacco Use  Smoking Status Current Every Day Smoker  . Packs/day: 0.25  . Years: 40.00  . Pack years: 10.00  . Types: Cigarettes  Smokeless Tobacco Never Used  Tobacco Comment   he sated that he would quit if he was told he had to.     Labs: Recent Review Flowsheet Data     Labs for ITP Cardiac and Pulmonary Rehab Latest Ref Rng & Units 08/05/2018 08/05/2018 08/05/2018 08/06/2018 08/06/2018   Cholestrol 0 - 200 mg/dL - - - - -   LDLCALC 0 - 99 mg/dL - - - - -   HDL >40 mg/dL - - - - -   Trlycerides <150 mg/dL - - - - -   Hemoglobin A1c 4.8 - 5.6 % - - - - -   PHART 7.350 - 7.450 7.310(L) - 7.304(L) 7.300(L) -   PCO2ART 32.0 - 48.0 mmHg 43.8 - 42.4 41.2 -   HCO3 20.0 - 28.0 mmol/L 22.0 - 21.0 20.1 -   TCO2 22 - 32 mmol/L 23 21(L) 22 21(L) 22   ACIDBASEDEF 0.0 - 2.0 mmol/L 4.0(H) - 5.0(H) 6.0(H) -   O2SAT % 99.0 - 99.0 99.0 -      Capillary Blood Glucose: Lab Results  Component Value Date   GLUCAP 87 08/08/2018   GLUCAP 100 (H) 08/08/2018   GLUCAP 99 08/08/2018   GLUCAP 124 (H) 08/07/2018   GLUCAP 126 (H) 08/07/2018     Exercise Target Goals: Exercise Program Goal: Individual exercise prescription set using results from initial 6 min walk test and THRR while considering  patient's activity barriers and safety.   Exercise Prescription Goal: Starting with aerobic activity 30  plus minutes a day, 3 days per week for initial exercise prescription. Provide home exercise prescription and guidelines that participant acknowledges understanding prior to discharge.  Activity Barriers & Risk Stratification: Activity Barriers & Cardiac Risk Stratification - 11/04/18 1038      Activity Barriers & Cardiac Risk Stratification   Cardiac Risk Stratification  High       6 Minute Walk: 6 Minute Walk    Row Name 11/04/18 1037         6 Minute Walk   Phase  Initial     Distance  1200 feet     Walk Time  6 minutes     # of Rest Breaks  0     MPH  2.27     METS  2.74     RPE  8     Perceived Dyspnea   7     VO2 Peak  10.26     Symptoms  No     Resting HR  73 bpm     Resting BP  148/78     Resting Oxygen Saturation   97 %     Exercise Oxygen Saturation  during 6 min walk  94 %     Max Ex. HR  83 bpm     Max Ex. BP  154/70     2 Minute Post BP  144/78         Oxygen Initial Assessment:   Oxygen Re-Evaluation:   Oxygen Discharge (Final Oxygen Re-Evaluation):   Initial Exercise Prescription: Initial Exercise Prescription - 11/04/18 1000      Date of Initial Exercise RX and Referring Provider   Date  11/04/18    Referring Provider  Roxy Manns    Expected Discharge Date  02/03/19      Treadmill   MPH  1.3    Grade  0    Minutes  17    METs  1.99      NuStep   Level  1    SPM  120    Minutes  22    METs  2.4      Prescription Details   Frequency (times per week)  3    Duration  Progress to 30 minutes of continuous aerobic without signs/symptoms of physical distress      Intensity   THRR 40-80% of Max Heartrate  [id number]    Ratings of Perceived Exertion  11-13    Perceived Dyspnea  0-4      Progression   Progression  Continue to progress workloads to maintain intensity without signs/symptoms of physical distress.      Resistance Training   Training Prescription  Yes    Weight  1    Reps  10-15       Perform Capillary Blood Glucose checks as needed.  Exercise Prescription Changes:  Exercise Prescription Changes    Row Name 11/04/18 1000 11/17/18 1500 12/03/18 0800 12/16/18 1400 12/29/18 1200     Response to Exercise   Blood Pressure (Admit)  148/78  130/70  126/84  116/70  150/80   Blood Pressure (Exercise)  154/70  132/70  140/88  132/88  156/80   Blood Pressure (Exit)  144/78  126/80  122/66  122/64  136/80   Heart Rate (Admit)  73 bpm  64 bpm  75 bpm  65 bpm  60 bpm   Heart Rate (Exercise)  83 bpm  87 bpm  85 bpm  82 bpm  85 bpm  Heart Rate (Exit)  68 bpm  73 bpm  71 bpm  73 bpm  69 bpm   Rating of Perceived Exertion (Exercise)  _0 Perceived Dyspnea (Exercise)  7  -  -  -  -   Comments  6 minute walk test  First two weeks of exercise  -  -  increase in overall MET level    Duration  Progress to 30 minutes of  aerobic without signs/symptoms of physical distress  Progress to 30 minutes of   aerobic without signs/symptoms of physical distress  Continue with 30 min of aerobic exercise without signs/symptoms of physical distress.  Continue with 30 min of aerobic exercise without signs/symptoms of physical distress.  Continue with 30 min of aerobic exercise without signs/symptoms of physical distress.   Intensity  THRR New 103-119-134  THRR unchanged  THRR unchanged  THRR unchanged  THRR unchanged     Progression   Progression  -  -  Continue to progress workloads to maintain intensity without signs/symptoms of physical distress.  Continue to progress workloads to maintain intensity without signs/symptoms of physical distress.  Continue to progress workloads to maintain intensity without signs/symptoms of physical distress.   Average METs  -  -  2.36  2.46  2.61     Resistance Training   Training Prescription  -  Yes  Yes  Yes  Yes   Weight  -  _1 Reps  -  10-15  10-15  10-15  10-15     Treadmill   MPH  -  1.5  2  2.4  2.5   Grade  -  0  0  0  0   Minutes  -  _2 METs  -  2.1  2.53  2.83  2.91     NuStep   Level  -  _3 SPM  -  91  89  91  98   Minutes  -  _4 METs  -  2.3  2.2  2.2  2.3     Home Exercise Plan   Plans to continue exercise at  Home (comment) walking  Home (comment)  -  Home (comment)  Home (comment)   Frequency  Add 2 additional days to program exercise sessions.  Add 2 additional days to program exercise sessions.  -  Add 2 additional days to program exercise sessions.  Add 2 additional days to program exercise sessions.   Initial Home Exercises Provided  11/04/18  11/04/18  -  11/04/18  11/04/18      Exercise Comments:  Exercise Comments    Row Name 11/17/18 1554 12/08/18 1539 01/05/19 0836       Exercise Comments  Pt. has done well so far. He has attended 5 exercise sessions and is always willing to do what is asked. Will progress as tolerated.   Pt. continues to do well in the program. He has attended 12  exercise sessions and has tolerated all of his progressions with ease.   Pt. has attended 24 exercise sessions. He is up to 2.6 MPH on the TM and level 3 on the NuStep. He always pushes himself to go alittle farther than the day before.         Exercise Goals  and Review:  Exercise Goals    Row Name 11/04/18 1041             Exercise Goals   Increase Physical Activity  Yes       Intervention  Provide advice, education, support and counseling about physical activity/exercise needs.;Develop an individualized exercise prescription for aerobic and resistive training based on initial evaluation findings, risk stratification, comorbidities and participant's personal goals.       Expected Outcomes  Short Term: Attend rehab on a regular basis to increase amount of physical activity.       Increase Strength and Stamina  Yes       Intervention  Provide advice, education, support and counseling about physical activity/exercise needs.;Develop an individualized exercise prescription for aerobic and resistive training based on initial evaluation findings, risk stratification, comorbidities and participant's personal goals.       Expected Outcomes  Short Term: Increase workloads from initial exercise prescription for resistance, speed, and METs.       Able to understand and use rate of perceived exertion (RPE) scale  Yes       Intervention  Provide education and explanation on how to use RPE scale       Expected Outcomes  Short Term: Able to use RPE daily in rehab to express subjective intensity level;Long Term:  Able to use RPE to guide intensity level when exercising independently       Able to understand and use Dyspnea scale  Yes       Intervention  Provide education and explanation on how to use Dyspnea scale       Expected Outcomes  Short Term: Able to use Dyspnea scale daily in rehab to express subjective sense of shortness of breath during exertion;Long Term: Able to use Dyspnea scale to guide  intensity level when exercising independently       Knowledge and understanding of Target Heart Rate Range (THRR)  Yes       Intervention  Provide education and explanation of THRR including how the numbers were predicted and where they are located for reference       Expected Outcomes  Short Term: Able to state/look up THRR       Able to check pulse independently  Yes       Intervention  Provide education and demonstration on how to check pulse in carotid and radial arteries.;Review the importance of being able to check your own pulse for safety during independent exercise       Expected Outcomes  Short Term: Able to explain why pulse checking is important during independent exercise;Long Term: Able to check pulse independently and accurately       Understanding of Exercise Prescription  Yes       Intervention  Provide education, explanation, and written materials on patient's individual exercise prescription       Expected Outcomes  Short Term: Able to explain program exercise prescription;Long Term: Able to explain home exercise prescription to exercise independently          Exercise Goals Re-Evaluation : Exercise Goals Re-Evaluation    Row Name 11/17/18 1553 12/08/18 1537 01/05/19 0835         Exercise Goal Re-Evaluation   Exercise Goals Review  Increase Physical Activity;Able to understand and use rate of perceived exertion (RPE) scale;Increase Strength and Stamina;Able to check pulse independently;Understanding of Exercise Prescription;Knowledge and understanding of Target Heart Rate Range (THRR)  Increase Physical Activity;Able to understand and use rate of  perceived exertion (RPE) scale;Increase Strength and Stamina;Able to check pulse independently;Understanding of Exercise Prescription;Knowledge and understanding of Target Heart Rate Range (THRR)  Increase Physical Activity;Able to understand and use rate of perceived exertion (RPE) scale;Increase Strength and Stamina;Able to check  pulse independently;Understanding of Exercise Prescription;Knowledge and understanding of Target Heart Rate Range (THRR)     Comments  Pt. has attended 5 exercise sessions so far. He has tolerated the exercise well. Will continue to monitor and progress as we see fit.   Pt. has attended rehab regularly. He has done well in the program and has handled his progressions with ease. We will continue to monitor and increase his workloads as necessary.   Pt. continues to do well in rehab. He is the most regular attending patient we have. He continues to handle all exercise with ease. We will continue to progress as tolerated.      Expected Outcomes  improve heart health.   improve heart health.   improve heart health and decrease smoking          Discharge Exercise Prescription (Final Exercise Prescription Changes): Exercise Prescription Changes - 12/29/18 1200      Response to Exercise   Blood Pressure (Admit)  150/80    Blood Pressure (Exercise)  156/80    Blood Pressure (Exit)  136/80    Heart Rate (Admit)  60 bpm    Heart Rate (Exercise)  85 bpm    Heart Rate (Exit)  69 bpm    Rating of Perceived Exertion (Exercise)  11    Comments  increase in overall MET level     Duration  Continue with 30 min of aerobic exercise without signs/symptoms of physical distress.    Intensity  THRR unchanged      Progression   Progression  Continue to progress workloads to maintain intensity without signs/symptoms of physical distress.    Average METs  2.61      Resistance Training   Training Prescription  Yes    Weight  3    Reps  10-15      Treadmill   MPH  2.5    Grade  0    Minutes  17    METs  2.91      NuStep   Level  2    SPM  98    Minutes  22    METs  2.3      Home Exercise Plan   Plans to continue exercise at  Home (comment)    Frequency  Add 2 additional days to program exercise sessions.    Initial Home Exercises Provided  11/04/18       Nutrition:  Target Goals: Understanding  of nutrition guidelines, daily intake of sodium '1500mg'$ , cholesterol '200mg'$ , calories 30% from fat and 7% or less from saturated fats, daily to have 5 or more servings of fruits and vegetables.  Biometrics: Pre Biometrics - 11/04/18 1041      Pre Biometrics   Height  '6\' 1"'$  (1.854 m)    Waist Circumference  39.5 inches    Hip Circumference  40 inches    Waist to Hip Ratio  0.99 %    Triceps Skinfold  4 mm    % Body Fat  20.9 %    Grip Strength  16.9 kg    Flexibility  0 in    Single Leg Stand  60 seconds        Nutrition Therapy Plan and Nutrition Goals: Nutrition Therapy &  Goals - 01/07/19 1014      Nutrition Therapy   RD appointment deferred  Yes      Personal Nutrition Goals   Comments  Patient did not attend RD appointment. He states he is eating more toss salads than he use to. Will continue to monitor for progress.       Intervention Plan   Intervention  Nutrition handout(s) given to patient.       Nutrition Assessments: Nutrition Assessments - 11/04/18 1121      MEDFICTS Scores   Pre Score  37       Nutrition Goals Re-Evaluation:   Nutrition Goals Discharge (Final Nutrition Goals Re-Evaluation):   Psychosocial: Target Goals: Acknowledge presence or absence of significant depression and/or stress, maximize coping skills, provide positive support system. Participant is able to verbalize types and ability to use techniques and skills needed for reducing stress and depression.  Initial Review & Psychosocial Screening: Initial Psych Review & Screening - 11/04/18 1112      Initial Review   Current issues with  None Identified      Family Dynamics   Good Support System?  Yes      Barriers   Psychosocial barriers to participate in program  There are no identifiable barriers or psychosocial needs.      Screening Interventions   Interventions  Encouraged to exercise    Expected Outcomes  Short Term goal: Identification and review with participant of any  Quality of Life or Depression concerns found by scoring the questionnaire.;Long Term goal: The participant improves quality of Life and PHQ9 Scores as seen by post scores and/or verbalization of changes       Quality of Life Scores: Quality of Life - 11/04/18 1043      Quality of Life   Select  Quality of Life      Quality of Life Scores   Health/Function Pre  27.43 %    Socioeconomic Pre  28 %    Psych/Spiritual Pre  28.21 %    Family Pre  27.6 %    GLOBAL Pre  27.73 %      Scores of 19 and below usually indicate a poorer quality of life in these areas.  A difference of  2-3 points is a clinically meaningful difference.  A difference of 2-3 points in the total score of the Quality of Life Index has been associated with significant improvement in overall quality of life, self-image, physical symptoms, and general health in studies assessing change in quality of life.  PHQ-9: Recent Review Flowsheet Data    Depression screen [facility] 2/9 11/04/2018   Decreased Interest 0   Down, Depressed, Hopeless 0   PHQ - 2 Score 0   Altered sleeping 0   Tired, decreased energy 0   Change in appetite 0   Feeling bad or failure about yourself  0   Trouble concentrating 0   Moving slowly or fidgety/restless 0   Suicidal thoughts 0   PHQ-9 Score 0     Interpretation of Total Score  Total Score Depression Severity:  1-4 = Minimal depression, 5-9 = Mild depression, 10-14 = Moderate depression, 15-19 = Moderately severe depression, 20-27 = Severe depression   Psychosocial Evaluation and Intervention: Psychosocial Evaluation - 11/04/18 1115      Psychosocial Evaluation & Interventions   Interventions  Encouraged to exercise with the program and follow exercise prescription    Continue Psychosocial Services   No Follow up required  Psychosocial Re-Evaluation: Psychosocial Re-Evaluation    Row Name 11/19/18 0853 12/10/18 1250 01/07/19 1020         Psychosocial Re-Evaluation   Current  issues with  None Identified  None Identified  None Identified     Comments  Patient's initial QOL score was 27.73 and his PHQ-9 score was 0 with no psychosocial issues identified.  Patient's initial QOL score was 27.73 and his PHQ-9 score was 0 with no psychosocial issues identified.  Patient's initial QOL score was 27.73 and his PHQ-9 score was 0 with no psychosocial issues identified.     Expected Outcomes  Patient will have no psychosocial issues identified at discharge.   Patient will have no psychosocial issues identified at discharge.   Patient will have no psychosocial issues identified at discharge.      Interventions  Relaxation education;Stress management education;Encouraged to attend Cardiac Rehabilitation for the exercise  Relaxation education;Stress management education;Encouraged to attend Cardiac Rehabilitation for the exercise  Relaxation education;Stress management education;Encouraged to attend Cardiac Rehabilitation for the exercise     Continue Psychosocial Services   No Follow up required  No Follow up required  No Follow up required        Psychosocial Discharge (Final Psychosocial Re-Evaluation): Psychosocial Re-Evaluation - 01/07/19 1020      Psychosocial Re-Evaluation   Current issues with  None Identified    Comments  Patient's initial QOL score was 27.73 and his PHQ-9 score was 0 with no psychosocial issues identified.    Expected Outcomes  Patient will have no psychosocial issues identified at discharge.     Interventions  Relaxation education;Stress management education;Encouraged to attend Cardiac Rehabilitation for the exercise    Continue Psychosocial Services   No Follow up required       Vocational Rehabilitation: Provide vocational rehab assistance to qualifying candidates.   Vocational Rehab Evaluation & Intervention: Vocational Rehab - 11/04/18 1122      Initial Vocational Rehab Evaluation & Intervention   Assessment shows need for Vocational  Rehabilitation  No       Education: Education Goals: Education classes will be provided on a weekly basis, covering required topics. Participant will state understanding/return demonstration of topics presented.  Learning Barriers/Preferences: Learning Barriers/Preferences - 11/04/18 1121      Learning Barriers/Preferences   Learning Barriers  None    Learning Preferences  Verbal Instruction;Skilled Demonstration;Individual Instruction       Education Topics: Hypertension, Hypertension Reduction -Define heart disease and high blood pressure. Discus how high blood pressure affects the body and ways to reduce high blood pressure.   CARDIAC REHAB PHASE II EXERCISE from 01/06/2019 in [place]  Date  01/06/19  Educator  DJ  Instruction Review Code  2- Demonstrated Understanding      Exercise and Your Heart -Discuss why it is important to exercise, the FITT principles of exercise, normal and abnormal responses to exercise, and how to exercise safely.   Angina -Discuss definition of angina, causes of angina, treatment of angina, and how to decrease risk of having angina.   Cardiac Medications -Review what the following cardiac medications are used for, how they affect the body, and side effects that may occur when taking the medications.  Medications include Aspirin, Beta blockers, calcium channel blockers, ACE Inhibitors, angiotensin receptor blockers, diuretics, digoxin, and antihyperlipidemics.   Congestive Heart Failure -Discuss the definition of CHF, how to live with CHF, the signs and symptoms of CHF, and how keep track of weight and sodium  intake.   Heart Disease and Intimacy -Discus the effect sexual activity has on the heart, how changes occur during intimacy as we age, and safety during sexual activity.   CARDIAC REHAB PHASE II EXERCISE from 01/06/2019 in [place]  Date  11/11/18  Educator  Etheleen Mayhew  Instruction Review  Code  2- Demonstrated Understanding      Smoking Cessation / COPD -Discuss different methods to quit smoking, the health benefits of quitting smoking, and the definition of COPD.   Nutrition I: Fats -Discuss the types of cholesterol, what cholesterol does to the heart, and how cholesterol levels can be controlled.   Nutrition II: Labels -Discuss the different components of food labels and how to read food label   CARDIAC REHAB PHASE II EXERCISE from 01/06/2019 in [place]  Date  12/09/18  Educator  DC  Instruction Review Code  2- Demonstrated Understanding      Heart Parts/Heart Disease and PAD -Discuss the anatomy of the heart, the pathway of blood circulation through the heart, and these are affected by heart disease.   CARDIAC REHAB PHASE II EXERCISE from 01/06/2019 in [place]  Date  12/04/18  Educator  Wynetta Emery  Instruction Review Code  2- Demonstrated Understanding      Stress I: Signs and Symptoms -Discuss the causes of stress, how stress may lead to anxiety and depression, and ways to limit stress.   CARDIAC REHAB PHASE II EXERCISE from 01/06/2019 in [place]  Date  12/16/18  Educator  DJ  Instruction Review Code  2- Demonstrated Understanding      Stress II: Relaxation -Discuss different types of relaxation techniques to limit stress.   CARDIAC REHAB PHASE II EXERCISE from 01/06/2019 in [place]  Date  12/23/18  Educator  DJ  Instruction Review Code  2- Demonstrated Understanding      Warning Signs of Stroke / TIA -Discuss definition of a stroke, what the signs and symptoms are of a stroke, and how to identify when someone is having stroke.   CARDIAC REHAB PHASE II EXERCISE from 01/06/2019 in [place]  Date  12/30/18  Educator  DJ  Instruction Review Code  2- Demonstrated Understanding      Knowledge Questionnaire Score: Knowledge  Questionnaire Score - 11/04/18 1122      Knowledge Questionnaire Score   Pre Score  24/28       Core Components/Risk Factors/Patient Goals at Admission: Personal Goals and Risk Factors at Admission - 11/04/18 1123      Core Components/Risk Factors/Patient Goals on Admission    Weight Management  Weight Maintenance    Personal Goal Other  Yes    Personal Goal  To get a good report about his heart.     Intervention  Attend CR 3 x week and supplement with 2 x week at home exercise.     Expected Outcomes  Achieve personal goals.        Core Components/Risk Factors/Patient Goals Review:  Goals and Risk Factor Review    Row Name 11/19/18 3893 12/10/18 1248 01/07/19 1017         Core Components/Risk Factors/Patient Goals Review   Personal Goals Review  Weight Management/Obesity Improve heart; get good new about heart.   Weight Management/Obesity;Tobacco Cessation Improve heart; get back to work; increase strength and stamina.   Weight Management/Obesity;Tobacco Cessation Improve heart; get good news about heart.      Review  Patient is new to the program completeing 6 sessions gaining 2 lbs since his initial visit. He has been progressed and is doing well so far. Will continue to monitor for progress.   Patient has completed 13 sessions gaining 2 lbs since last 30 day review. He is doing well in the program with progression. He says he is doing this because this is what the doctor wants him to do. He continues to smoke but is down to 6 cigarettes/day. Will continue to monitor for progress.   Patient has completed 25 sessions gaining 1 lbs since last 30 day review. He continues to do well in the program with progression. He states he is learing more about the body and his condition and he likes it. He is smoking 6 cigarretts/day. He states he does not smoke in his house or around his wife and children. Will continue to monitor for progress.      Expected Outcomes  Patient will continue to attend  sessions and complete the program meeting his personal goals.   Patient will continue to attend sessions and complete the program meeting his personal goals.   Patient will continue to attend sessions and complete the program meeting his personal goals.         Core Components/Risk Factors/Patient Goals at Discharge (Final Review):  Goals and Risk Factor Review - 01/07/19 1017      Core Components/Risk Factors/Patient Goals Review   Personal Goals Review  Weight Management/Obesity;Tobacco Cessation   Improve heart; get good news about heart.    Review  Patient has completed 25 sessions gaining 1 lbs since last 30 day review. He continues to do well in the program with progression. He states he is learing more about the body and his condition and he likes it. He is smoking 6 cigarretts/day. He states he does not smoke in his house or around his wife and children. Will continue to monitor for progress.     Expected Outcomes  Patient will continue to attend sessions and complete the program meeting his personal goals.        ITP Comments:   Comments: ITP REVIEW Patient doing well in the program. Will continue to monitor for progress.

## 2019-01-08 ENCOUNTER — Encounter (HOSPITAL_COMMUNITY)
Admission: RE | Admit: 2019-01-08 | Discharge: 2019-01-08 | Disposition: A | Discharge status: Home / Self Care | Payer: Medicare HMO | Source: Ambulatory Visit | Attending: Thoracic Surgery (Cardiothoracic Vascular Surgery) | Admitting: Thoracic Surgery (Cardiothoracic Vascular Surgery)

## 2019-01-08 DIAGNOSIS — Z951 Presence of aortocoronary bypass graft: Secondary | ICD-10-CM

## 2019-01-08 DIAGNOSIS — I214 Non-ST elevation (NSTEMI) myocardial infarction: Secondary | ICD-10-CM

## 2019-01-08 NOTE — Progress Notes (Signed)
Daily Session Note  Patient Details  Name: Timothy Simpson MRN: [id number] Date of Birth: 09-Jan-1946 Referring Provider:     [place] from 11/04/2018 in [place]  Referring Provider  Roxy Manns      Encounter Date: 01/08/2019  Check In: Session Check In - 01/08/19 0815      Check-In   Supervising physician immediately available to respond to emergencies  See telemetry face sheet for immediately available MD    Location  [facility]    Staff Present  Benay Pike, Exercise Physiologist;Debra Wynetta Emery, RN, BSN;Diane Coad, MS, EP, [facility], Exercise Physiologist    Medication changes reported      No    Fall or balance concerns reported     No    Tobacco Cessation  No Change    Warm-up and Cool-down  Performed as group-led instruction    Resistance Training Performed  Yes    VAD Patient?  No    PAD/SET Patient?  No      Pain Assessment   Currently in Pain?  No/denies    Pain Score  0-No pain    Multiple Pain Sites  No       Capillary Blood Glucose: No results found for this or any previous visit (from the past 24 hour(s)).    Social History   Tobacco Use  Smoking Status Current Every Day Smoker  . Packs/day: 0.25  . Years: 40.00  . Pack years: 10.00  . Types: Cigarettes  Smokeless Tobacco Never Used  Tobacco Comment   he sated that he would quit if he was told he had to.     Goals Met:  Proper associated with RPD/PD & O2 Sat Independence with exercise equipment Exercise tolerated well No report of cardiac concerns or symptoms Strength training completed today  Goals Unmet:  Not Applicable  Comments: Pt able to follow exercise prescription today without complaint.  Will continue to monitor for progression. Check out 0915.   Dr. Kate Sable is Medical Director for [facility] Cardiac and Pulmonary Rehab.

## 2019-01-11 ENCOUNTER — Encounter (HOSPITAL_COMMUNITY)
Admission: RE | Admit: 2019-01-11 | Discharge: 2019-01-11 | Disposition: A | Discharge status: Home / Self Care | Payer: Medicare HMO | Source: Ambulatory Visit | Attending: Thoracic Surgery (Cardiothoracic Vascular Surgery) | Admitting: Thoracic Surgery (Cardiothoracic Vascular Surgery)

## 2019-01-11 DIAGNOSIS — I214 Non-ST elevation (NSTEMI) myocardial infarction: Secondary | ICD-10-CM

## 2019-01-11 DIAGNOSIS — Z951 Presence of aortocoronary bypass graft: Secondary | ICD-10-CM

## 2019-01-11 NOTE — Progress Notes (Signed)
Daily Session Note  Patient Details  Name: Timothy Simpson MRN: [id number] Date of Birth: 1946-11-28 Referring Provider:     [place] from 11/04/2018 in [place]  Referring Provider  Roxy Manns      Encounter Date: 01/11/2019  Check In: Session Check In - 01/11/19 0815      Check-In   Supervising physician immediately available to respond to emergencies  See telemetry face sheet for immediately available MD    Location  [facility]    Staff Present  Benay Pike, Exercise Physiologist;Diane Coad, MS, EP, [facility], Exercise Physiologist    Medication changes reported      No    Fall or balance concerns reported     No    Tobacco Cessation  No Change    Warm-up and Cool-down  Performed as group-led instruction    Resistance Training Performed  Yes    VAD Patient?  No    PAD/SET Patient?  No      Pain Assessment   Currently in Pain?  No/denies    Pain Score  0-No pain    Multiple Pain Sites  No       Capillary Blood Glucose: No results found for this or any previous visit (from the past 24 hour(s)).    Social History   Tobacco Use  Smoking Status Current Every Day Smoker  . Packs/day: 0.25  . Years: 40.00  . Pack years: 10.00  . Types: Cigarettes  Smokeless Tobacco Never Used  Tobacco Comment   he sated that he would quit if he was told he had to.     Goals Met:  Proper associated with RPD/PD & O2 Sat Independence with exercise equipment Exercise tolerated well No report of cardiac concerns or symptoms Strength training completed today  Goals Unmet:  Not Applicable  Comments: Pt able to follow exercise prescription today without complaint.  Will continue to monitor for progression. Check out 0915.   Dr. Kate Sable is Medical Director for [facility] Cardiac and Pulmonary Rehab.

## 2019-01-13 ENCOUNTER — Encounter (HOSPITAL_COMMUNITY)
Admission: RE | Admit: 2019-01-13 | Discharge: 2019-01-13 | Disposition: A | Discharge status: Home / Self Care | Payer: Medicare HMO | Source: Ambulatory Visit | Attending: Thoracic Surgery (Cardiothoracic Vascular Surgery) | Admitting: Thoracic Surgery (Cardiothoracic Vascular Surgery)

## 2019-01-13 DIAGNOSIS — I214 Non-ST elevation (NSTEMI) myocardial infarction: Secondary | ICD-10-CM

## 2019-01-13 DIAGNOSIS — Z951 Presence of aortocoronary bypass graft: Secondary | ICD-10-CM

## 2019-01-13 NOTE — Progress Notes (Signed)
Daily Session Note  Patient Details  Name: BRICYN LABRADA MRN: [id number] Date of Birth: 07-04-1946 Referring Provider:     [place] from 11/04/2018 in [place]  Referring Provider  Roxy Manns      Encounter Date: 01/13/2019  Check In: Session Check In - 01/13/19 0815      Check-In   Supervising physician immediately available to respond to emergencies  See telemetry face sheet for immediately available MD    Location  [facility]    Staff Present  Benay Pike, Exercise Physiologist;Diane Coad, MS, EP, [facility], Exercise Physiologist;Kimika Streater Wynetta Emery, RN, BSN    Medication changes reported      No    Fall or balance concerns reported     No    Tobacco Cessation  No Change    Warm-up and Cool-down  Performed as group-led instruction    Resistance Training Performed  Yes    VAD Patient?  No    PAD/SET Patient?  No      Pain Assessment   Currently in Pain?  No/denies    Pain Score  0-No pain    Multiple Pain Sites  No       Capillary Blood Glucose: No results found for this or any previous visit (from the past 24 hour(s)).    Social History   Tobacco Use  Smoking Status Current Every Day Smoker  . Packs/day: 0.25  . Years: 40.00  . Pack years: 10.00  . Types: Cigarettes  Smokeless Tobacco Never Used  Tobacco Comment   he sated that he would quit if he was told he had to.     Goals Met:  Independence with exercise equipment Exercise tolerated well No report of cardiac concerns or symptoms Strength training completed today  Goals Unmet:  Not Applicable  Comments: Pt able to follow exercise prescription today without complaint.  Will continue to monitor for progression. Check out 915.   Dr. Kate Sable is Medical Director for [facility] Cardiac and Pulmonary Rehab.

## 2019-01-15 ENCOUNTER — Encounter (HOSPITAL_COMMUNITY)
Admission: RE | Admit: 2019-01-15 | Discharge: 2019-01-15 | Disposition: A | Discharge status: Home / Self Care | Payer: Medicare HMO | Source: Ambulatory Visit | Attending: Thoracic Surgery (Cardiothoracic Vascular Surgery) | Admitting: Thoracic Surgery (Cardiothoracic Vascular Surgery)

## 2019-01-15 DIAGNOSIS — Z951 Presence of aortocoronary bypass graft: Secondary | ICD-10-CM

## 2019-01-15 DIAGNOSIS — I214 Non-ST elevation (NSTEMI) myocardial infarction: Secondary | ICD-10-CM | POA: Diagnosis not present

## 2019-01-15 NOTE — Progress Notes (Signed)
Daily Session Note  Patient Details  Name: Timothy Simpson MRN: [id number] Date of Birth: [birth year] Referring Provider:     [place] from 11/04/2018 in [place]  Referring Provider  Roxy Manns      Encounter Date: 01/15/2019  Check In: Session Check In - 01/15/19 0815      Check-In   Supervising physician immediately available to respond to emergencies  See telemetry face sheet for immediately available MD    Location  [facility]    Staff Present  Benay Pike, Exercise Physiologist;Diane Coad, MS, EP, [facility], Exercise Physiologist;Kaelah Hayashi Wynetta Emery, RN, BSN    Medication changes reported      No    Fall or balance concerns reported     No    Tobacco Cessation  No Change    Warm-up and Cool-down  Performed as group-led instruction    Resistance Training Performed  Yes    VAD Patient?  No    PAD/SET Patient?  No      Pain Assessment   Currently in Pain?  No/denies    Pain Score  0-No pain    Multiple Pain Sites  No       Capillary Blood Glucose: No results found for this or any previous visit (from the past 24 hour(s)).    Social History   Tobacco Use  Smoking Status Current Every Day Smoker  . Packs/day: 0.25  . Years: 40.00  . Pack years: 10.00  . Types: Cigarettes  Smokeless Tobacco Never Used  Tobacco Comment   he sated that he would quit if he was told he had to.     Goals Met:  Independence with exercise equipment Exercise tolerated well No report of cardiac concerns or symptoms Strength training completed today  Goals Unmet:  Not Applicable  Comments: Pt able to follow exercise prescription today without complaint.  Will continue to monitor for progression. Check out 915.   Dr. Kate Sable is Medical Director for [facility] Cardiac and Pulmonary Rehab.

## 2019-01-18 ENCOUNTER — Encounter (HOSPITAL_COMMUNITY)
Admission: RE | Admit: 2019-01-18 | Discharge: 2019-01-18 | Disposition: A | Discharge status: Home / Self Care | Payer: Medicare HMO | Source: Ambulatory Visit | Attending: Thoracic Surgery (Cardiothoracic Vascular Surgery) | Admitting: Thoracic Surgery (Cardiothoracic Vascular Surgery)

## 2019-01-18 ENCOUNTER — Ambulatory Visit: Payer: Medicare HMO | Admitting: Gastroenterology

## 2019-01-18 ENCOUNTER — Encounter: Payer: Self-pay | Admitting: Gastroenterology

## 2019-01-18 ENCOUNTER — Other Ambulatory Visit: Payer: Self-pay

## 2019-01-18 DIAGNOSIS — Z86010 Personal history of colonic polyps: Secondary | ICD-10-CM

## 2019-01-18 DIAGNOSIS — I214 Non-ST elevation (NSTEMI) myocardial infarction: Secondary | ICD-10-CM | POA: Diagnosis not present

## 2019-01-18 DIAGNOSIS — D649 Anemia, unspecified: Secondary | ICD-10-CM | POA: Diagnosis not present

## 2019-01-18 DIAGNOSIS — Z951 Presence of aortocoronary bypass graft: Secondary | ICD-10-CM

## 2019-01-18 DIAGNOSIS — Z860101 Personal history of adenomatous and serrated colon polyps: Secondary | ICD-10-CM

## 2019-01-18 MED ORDER — PEG 3350-KCL-NA BICARB-NACL 420 G PO SOLR: 4000.0000 mL | ORAL | 0 refills | Status: DC

## 2019-01-18 NOTE — Patient Instructions (Signed)
Colonoscopy as scheduled. See separate instructions.  

## 2019-01-18 NOTE — Assessment & Plan Note (Signed)
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  He has a history of anemia although prior to CABG in September 2019 his hemoglobin was normal.  Suspect he simply has not had time to return to normal postoperatively.  His hemoglobin got down to 6, he required transfusions, at discharge his hemoglobin was up to 10.2.  October 2019 his hemoglobin was up to 11.7.  Patient had recent labs, we have requested these.  Plan on colonoscopy in the near future.  He denies any chronic pain medications.  He had minimal tramadol last year according to controlled substance database.  Previously did well with conscious sedation.  Plan for conscious sedation this time as well.  I have discussed the risks, alternatives, benefits with regards to but not limited to the risk of reaction to medication, bleeding, infection, perforation and the patient is agreeable to proceed. Written consent to be obtained.  Hold iron 7 days.

## 2019-01-18 NOTE — Progress Notes (Signed)
Daily Session Note  Patient Details  Name: Timothy Simpson MRN: [id number] Date of Birth: 07/28/1946 Referring Provider:     [place] from 11/04/2018 in [place]  Referring Provider  Roxy Manns      Encounter Date: 01/18/2019  Check In: Session Check In - 01/18/19 0815      Check-In   Supervising physician immediately available to respond to emergencies  See telemetry face sheet for immediately available MD    Location  [facility]    Staff Present  Benay Pike, Exercise Physiologist;Diane Coad, MS, EP, [facility], Exercise Physiologist;Debra Wynetta Emery, RN, BSN    Medication changes reported      No    Fall or balance concerns reported     No    Tobacco Cessation  No Change    Warm-up and Cool-down  Performed as group-led instruction    Resistance Training Performed  Yes    VAD Patient?  No    PAD/SET Patient?  No      Pain Assessment   Currently in Pain?  No/denies    Pain Score  0-No pain    Multiple Pain Sites  No       Capillary Blood Glucose: No results found for this or any previous visit (from the past 24 hour(s)).    Social History   Tobacco Use  Smoking Status Current Every Day Smoker  . Packs/day: 0.25  . Years: 40.00  . Pack years: 10.00  . Types: Cigarettes  Smokeless Tobacco Never Used  Tobacco Comment   he sated that he would quit if he was told he had to.     Goals Met:  Proper associated with RPD/PD & O2 Sat Independence with exercise equipment Exercise tolerated well No report of cardiac concerns or symptoms Strength training completed today  Goals Unmet:  Not Applicable  Comments: Pt able to follow exercise prescription today without complaint.  Will continue to monitor for progression. Check out 0915.   Dr. Kate Sable is Medical Director for [facility] Cardiac and Pulmonary Rehab.

## 2019-01-18 NOTE — Progress Notes (Signed)
Primary Care Physician:  Jani Gravel, MD  Primary Gastroenterologist:  Garfield Cornea, MD   Chief Complaint  Patient presents with  . Consult    TCS. ? last done 2004    HPI:  Timothy Simpson is a 73 y.o. male here the request of Dr. Maudie Mercury for colonoscopy.  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  Colonoscopy by Dr. Gala Romney in 2004 for surveillance was normal.  Surveillance colonoscopy in 2009, 6 mm polyp in the ascending segment and one in the sigmoid colon removed.  Pathology showed tubular adenomas.  5-year surveillance colonoscopy recommended.  Patient presents back at the request of Dr. Maudie Mercury to update colonoscopy.  Patient underwent a CABG back on September 4.  Preoperatively he had a normal hemoglobin of 13.3.  With surgery his hemoglobin dropped to 6.5.  Require blood transfusions.  At time of discharge on September 23 it was 10.2.  Follow-up labs by PCP on October 24 with hemoglobin 11.7, hematocrit 37.9, MCV 86.  Patient denies any concerns.  No constipation or diarrhea.  No melena or rectal bleeding.  Denies upper GI symptoms.  No recent weight loss.     Current Outpatient Medications  Medication Sig Dispense Refill  . allopurinol (ZYLOPRIM) 100 MG tablet Take 100 mg by mouth daily.    Marland Kitchen aspirin EC 81 MG EC tablet Take 1 tablet (81 mg total) by mouth daily.    Marland Kitchen atorvastatin (LIPITOR) 80 MG tablet Take 1 tablet (80 mg total) by mouth daily at 6 PM. 30 tablet 1  . clopidogrel (PLAVIX) 75 MG tablet Take 1 tablet (75 mg total) by mouth daily. 30 tablet 1  . ferrous sulfate 325 (65 FE) MG EC tablet Take 1 tablet (325 mg total) by mouth 2 (two) times daily. 60 tablet 3  . metoprolol tartrate (LOPRESSOR) 25 MG tablet Take 1 tablet (25 mg total) by mouth 2 (two) times daily. 60 tablet 3   No current facility-administered medications for this visit.     Allergies as of 01/18/2019  . (No Known Allergies)    Past Medical History:  Diagnosis Date  .  Coronary artery disease   . Gout   . High cholesterol   . Hypertension   . NSTEMI (non-ST elevated myocardial infarction) ([place]) 07/29/2018  . 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 Tobacco abuse     Past Surgical History:  Procedure Laterality Date  . BACK SURGERY    . COLONOSCOPY  2004   Dr. Gala Romney: Normal.  For history of tubulovillous adenoma back in 2001, five-year follow-up recommended.  . COLONOSCOPY  2009   Dr. Emerson Monte: 6 mm polyp in the ascending segment and one in the sigmoid colon removed.  Pathology showed tubular adenomas.  5-year surveillance colonoscopy recommended.  . CORONARY ARTERY BYPASS GRAFT N/A 08/05/2018   Procedure: CORONARY ARTERY BYPASS GRAFTING (CABG) x3. Endoscopic Saphenous vein harvest.;  Surgeon: Rexene Alberts, MD;  Location: [place];  Service: Open Heart Surgery;  Laterality: N/A;  . INTRAVASCULAR PRESSURE WIRE/FFR STUDY N/A 08/04/2018   Procedure: INTRAVASCULAR PRESSURE WIRE/FFR STUDY;  Surgeon: Burnell Blanks, MD;  Location: [place] CV LAB;  Service: Cardiovascular;  Laterality: N/A;  . LEFT HEART CATH AND CORONARY ANGIOGRAPHY N/A 07/30/2018   Procedure: LEFT HEART CATH AND CORONARY ANGIOGRAPHY;  Surgeon: Jettie Booze, MD;  Location: [facility]  INVASIVE CV LAB;  Service: Cardiovascular;  Laterality: N/A;  . TEE WITHOUT CARDIOVERSION N/A 08/05/2018   Procedure: TRANSESOPHAGEAL ECHOCARDIOGRAM (TEE);  Surgeon: Rexene Alberts, MD;  Location: [place];  Service: Open Heart Surgery;  Laterality: N/A;    Family History  Problem Relation Age of Onset  . Hypertension Father     Social History   Socioeconomic History  . Marital status: Married    Spouse name: Not on file  . Number of children: Not on file  . Years of education: Not on file  . Highest education level: Not on file  Occupational History  . Not on file  Social Needs  .  Financial resource strain: Not on file  . Food insecurity:    Worry: Not on file    Inability: Not on file  . Transportation needs:    Medical: Not on file    Non-medical: Not on file  Tobacco Use  . Smoking status: Current Every Day Smoker    Packs/day: 0.25    Years: 40.00    Pack years: 10.00    Types: Cigarettes  . Smokeless tobacco: Never Used  . Tobacco comment: he sated that he would quit if he was told he had to.   Substance and Sexual Activity  . Alcohol use: No  . Drug use: No  . Sexual activity: Not on file  Lifestyle  . Physical activity:    Days per week: Not on file    Minutes per session: Not on file  . Stress: Not on file  Relationships  . Social connections:    Talks on phone: Not on file    Gets together: Not on file    Attends religious service: Not on file    Active member of club or organization: Not on file    Attends meetings of clubs or organizations: Not on file    Relationship status: Not on file  . Intimate partner violence:    Fear of current or ex partner: Not on file    Emotionally abused: Not on file    Physically abused: Not on file    Forced sexual activity: Not on file  Other Topics Concern  . Not on file  Social History Narrative  . Not on file      ROS:  General: Negative for anorexia, weight loss, fever, chills, fatigue, weakness. Eyes: Negative for vision changes.  ENT: Negative for hoarseness, difficulty swallowing , nasal congestion. CV: Negative for chest pain, angina, palpitations, dyspnea on exertion, peripheral edema.  Respiratory: Negative for dyspnea at rest, dyspnea on exertion, cough, sputum, wheezing.  GI: See history of present illness. GU:  Negative for dysuria, hematuria, urinary incontinence, urinary frequency, nocturnal urination.  MS: Negative for joint pain, low back pain.  Derm: Negative for rash or itching.  Neuro: Negative for weakness, abnormal sensation, seizure, frequent headaches, memory loss,  confusion.  Psych: Negative for anxiety, depression, suicidal ideation, hallucinations.  Endo: Negative for unusual weight change.  Heme: Negative for bruising or bleeding. Allergy: Negative for rash or hives.    Physical Examination:  BP (!) 142/90   Pulse 68   Temp (!) 97 F (36.1 C) (Oral)   Ht 5\' 11"  (1.803 m)   Wt 197 lb 9.6 oz (89.6 kg)   BMI 27.56 kg/m    General: Well-nourished, well-developed in no acute distress.  Head: Normocephalic, atraumatic.   Eyes: Conjunctiva pink, no icterus. Mouth: Oropharyngeal mucosa moist and pink , no lesions erythema or  exudate. Neck: Supple without thyromegaly, masses, or lymphadenopathy.  Lungs: Clear to auscultation bilaterally.  Heart: Regular rate and rhythm, no murmurs rubs or gallops.  Abdomen: Bowel sounds are normal, nontender, nondistended, no hepatosplenomegaly or masses, no abdominal bruits or    hernia , no rebound or guarding.   Rectal: not performed Extremities: No lower extremity edema. No clubbing or deformities.  Neuro: Alert and oriented x 4 , grossly normal neurologically.  Skin: Warm and dry, no rash or jaundice.   Psych: Alert and cooperative, normal mood and affect.  Labs: 09/24/2018: White blood cell count 5400, hemoglobin 11.7 low, hematocrit 37.9, MCV 86, platelets 248,000, creatinine 0.95, BUN 8, glucose 101, albumin 4.5, total bilirubin 0.5, alkaline phosphatase 99, AST 15, ALT 13, TSH 0.937, A1c 5.6.  Imaging Studies: No results found.

## 2019-01-19 NOTE — Progress Notes (Signed)
CC'D TO PCP °

## 2019-01-20 ENCOUNTER — Encounter (HOSPITAL_COMMUNITY)
Admission: RE | Admit: 2019-01-20 | Discharge: 2019-01-20 | Disposition: A | Discharge status: Home / Self Care | Payer: Medicare HMO | Source: Ambulatory Visit | Attending: Thoracic Surgery (Cardiothoracic Vascular Surgery) | Admitting: Thoracic Surgery (Cardiothoracic Vascular Surgery)

## 2019-01-20 DIAGNOSIS — I214 Non-ST elevation (NSTEMI) myocardial infarction: Secondary | ICD-10-CM | POA: Diagnosis not present

## 2019-01-20 DIAGNOSIS — Z951 Presence of aortocoronary bypass graft: Secondary | ICD-10-CM

## 2019-01-20 NOTE — Progress Notes (Signed)
Daily Session Note  Patient Details  Name: Timothy Simpson MRN: [id number] Date of Birth: January 03, 1946 Referring Provider:     [place] from 11/04/2018 in [place]  Referring Provider  Roxy Manns      Encounter Date: 01/20/2019  Check In: Session Check In - 01/20/19 0815      Check-In   Supervising physician immediately available to respond to emergencies  See telemetry face sheet for immediately available MD    Location  [facility]    Staff Present  Benay Pike, Exercise Physiologist;Debra Wynetta Emery, RN, BSN    Medication changes reported      No    Fall or balance concerns reported     No    Tobacco Cessation  No Change    Warm-up and Cool-down  Performed as group-led instruction    Resistance Training Performed  Yes    VAD Patient?  No    PAD/SET Patient?  No      Pain Assessment   Currently in Pain?  No/denies    Pain Score  0-No pain    Multiple Pain Sites  No       Capillary Blood Glucose: No results found for this or any previous visit (from the past 24 hour(s)).    Social History   Tobacco Use  Smoking Status Current Every Day Smoker  . Packs/day: 0.25  . Years: 40.00  . Pack years: 10.00  . Types: Cigarettes  Smokeless Tobacco Never Used  Tobacco Comment   he sated that he would quit if he was told he had to.     Goals Met:  Proper associated with RPD/PD & O2 Sat Independence with exercise equipment Exercise tolerated well No report of cardiac concerns or symptoms Strength training completed today  Goals Unmet:  Not Applicable  Comments: Pt able to follow exercise prescription today without complaint.  Will continue to monitor for progression. Check out 0915.   Dr. Kate Sable is Medical Director for [facility] Cardiac and Pulmonary Rehab.

## 2019-01-21 NOTE — Progress Notes (Signed)
Received copy of labs from PCP.   01/14/2019:  WBC 4900, H/H 15.1/46.5, Platelet 205000, LFTs normal, BUN/Cre normal.   Please let patient know his anemia resolved.  He still needs to have colonoscopy.  He can stop oral iron.

## 2019-01-22 ENCOUNTER — Encounter (HOSPITAL_COMMUNITY)
Admission: RE | Admit: 2019-01-22 | Discharge: 2019-01-22 | Disposition: A | Discharge status: Home / Self Care | Payer: Medicare HMO | Source: Ambulatory Visit | Attending: Thoracic Surgery (Cardiothoracic Vascular Surgery) | Admitting: Thoracic Surgery (Cardiothoracic Vascular Surgery)

## 2019-01-22 DIAGNOSIS — I214 Non-ST elevation (NSTEMI) myocardial infarction: Secondary | ICD-10-CM | POA: Diagnosis not present

## 2019-01-22 DIAGNOSIS — Z951 Presence of aortocoronary bypass graft: Secondary | ICD-10-CM

## 2019-01-22 NOTE — Progress Notes (Signed)
Daily Session Note  Patient Details  Name: ALEKSEI GOODLIN MRN: [id number] Date of Birth: Aug 19, 1946 Referring Provider:     [place] from 11/04/2018 in [place]  Referring Provider  Roxy Manns      Encounter Date: 01/22/2019  Check In: Session Check In - 01/22/19 0930      Check-In   Supervising physician immediately available to respond to emergencies  See telemetry face sheet for immediately available MD    Location  [facility]    Staff Present  Benay Pike, Exercise Physiologist;Debra Wynetta Emery, RN, BSN;Diane Coad, MS, EP, [facility], Exercise Physiologist    Medication changes reported      No    Fall or balance concerns reported     No    Tobacco Cessation  No Change    Warm-up and Cool-down  Performed as group-led instruction    Resistance Training Performed  Yes    VAD Patient?  No    PAD/SET Patient?  No      Pain Assessment   Currently in Pain?  No/denies    Pain Score  0-No pain    Multiple Pain Sites  No       Capillary Blood Glucose: No results found for this or any previous visit (from the past 24 hour(s)).    Social History   Tobacco Use  Smoking Status Current Every Day Smoker  . Packs/day: 0.25  . Years: 40.00  . Pack years: 10.00  . Types: Cigarettes  Smokeless Tobacco Never Used  Tobacco Comment   he sated that he would quit if he was told he had to.     Goals Met:  Independence with exercise equipment Exercise tolerated well No report of cardiac concerns or symptoms Strength training completed today  Goals Unmet:  Not Applicable  Comments: Pt able to follow exercise prescription today without complaint.  Will continue to monitor for progression. Check out 1030.   Dr. Kate Sable is Medical Director for [facility] Cardiac and Pulmonary Rehab.

## 2019-01-25 ENCOUNTER — Encounter (HOSPITAL_COMMUNITY)
Admission: RE | Admit: 2019-01-25 | Discharge: 2019-01-25 | Disposition: A | Discharge status: Home / Self Care | Payer: Medicare HMO | Source: Ambulatory Visit | Attending: Thoracic Surgery (Cardiothoracic Vascular Surgery) | Admitting: Thoracic Surgery (Cardiothoracic Vascular Surgery)

## 2019-01-25 DIAGNOSIS — I214 Non-ST elevation (NSTEMI) myocardial infarction: Secondary | ICD-10-CM

## 2019-01-25 DIAGNOSIS — Z951 Presence of aortocoronary bypass graft: Secondary | ICD-10-CM

## 2019-01-25 NOTE — Progress Notes (Signed)
Pt's spouse is aware and will pass the information on to her spouse.

## 2019-01-25 NOTE — Progress Notes (Signed)
Daily Session Note  Patient Details  Name: Timothy Simpson MRN: [id number] Date of Birth: 09/07/1946 Referring Provider:     [facility] from 11/04/2018 in [place]  Referring Provider  Roxy Manns      Encounter Date: 01/25/2019  Check In: Session Check In - 01/25/19 0815      Check-In   Supervising physician immediately available to respond to emergencies  See telemetry face sheet for immediately available MD    Location  [facility]    Staff Present  Benay Pike, Exercise Physiologist;Debra Wynetta Emery, RN, BSN    Medication changes reported      No    Fall or balance concerns reported     No    Tobacco Cessation  No Change    Warm-up and Cool-down  Performed as group-led instruction    Resistance Training Performed  Yes    VAD Patient?  No    PAD/SET Patient?  No      Pain Assessment   Currently in Pain?  No/denies    Pain Score  0-No pain    Multiple Pain Sites  No       Capillary Blood Glucose: No results found for this or any previous visit (from the past 24 hour(s)).    Social History   Tobacco Use  Smoking Status Current Every Day Smoker  . Packs/day: 0.25  . Years: 40.00  . Pack years: 10.00  . Types: Cigarettes  Smokeless Tobacco Never Used  Tobacco Comment   he sated that he would quit if he was told he had to.     Goals Met:  Proper associated with RPD/PD & O2 Sat Independence with exercise equipment Exercise tolerated well No report of cardiac concerns or symptoms Strength training completed today  Goals Unmet:  Not Applicable  Comments: Pt able to follow exercise prescription today without complaint.  Will continue to monitor for progression. Check out 0915.   Dr. Kate Sable is Medical Director for [facility] Card[facility] and Pulmonary Rehab.

## 2019-01-27 ENCOUNTER — Encounter (HOSPITAL_COMMUNITY)
Admission: RE | Admit: 2019-01-27 | Discharge: 2019-01-27 | Disposition: A | Discharge status: Home / Self Care | Payer: Medicare HMO | Source: Ambulatory Visit | Attending: Thoracic Surgery (Cardiothoracic Vascular Surgery) | Admitting: Thoracic Surgery (Cardiothoracic Vascular Surgery)

## 2019-01-27 DIAGNOSIS — Z951 Presence of aortocoronary bypass graft: Secondary | ICD-10-CM

## 2019-01-27 DIAGNOSIS — I214 Non-ST elevation (NSTEMI) myocardial infarction: Secondary | ICD-10-CM | POA: Diagnosis not present

## 2019-01-27 NOTE — Progress Notes (Signed)
Daily Session Note  Patient Details  Name: Timothy Simpson MRN: [id number] Date of Birth: 11/24/1946 Referring Provider:     [place] [facility] from 11/04/2018 in [place]  Referring Provider  Roxy Manns      Encounter Date: 01/27/2019  Check In: Session Check In - 01/27/19 0815      Check-In   Supervising physician immediately available to respond to emergencies  See telemetry face sheet for immediately available MD    Location  [facility]    Staff Present  Benay Pike, Exercise Physiologist;Debra Wynetta Emery, RN, BSN    Medication changes reported      No    Fall or balance concerns reported     No    Tobacco Cessation  No Change    Warm-up and Cool-down  Performed as group-led instruction    Resistance Training Performed  Yes    VAD Patient?  No    PAD/SET Patient?  No      Pain Assessment   Currently in Pain?  No/denies    Pain Score  0-No pain    Multiple Pain Sites  No       Capillary Blood Glucose: No results found for this or any previous visit (from the past 24 hour(s)).    Social History   Tobacco Use  Smoking Status Current Every Day Smoker  . Packs/day: 0.25  . Years: 40.00  . Pack years: 10.00  . Types: Cigarettes  Smokeless Tobacco Never Used  Tobacco Comment   he sated that he would quit if he was told he had to.     Goals Met:  Proper associated with RPD/PD & O2 Sat Independence with exercise equipment Exercise tolerated well No report of cardiac concerns or symptoms Strength training completed today  Goals Unmet:  Not Applicable  Comments: Pt able to follow exercise prescription today without complaint.  Will continue to monitor for progression. Check out 0915.   Dr. Kate Sable is Medical Director for [facility] Cardiac and Pulmonary Rehab.

## 2019-01-29 ENCOUNTER — Encounter (HOSPITAL_COMMUNITY)
Admission: RE | Admit: 2019-01-29 | Discharge: 2019-01-29 | Disposition: A | Discharge status: Home / Self Care | Payer: Medicare HMO | Source: Ambulatory Visit | Attending: Thoracic Surgery (Cardiothoracic Vascular Surgery) | Admitting: Thoracic Surgery (Cardiothoracic Vascular Surgery)

## 2019-01-29 DIAGNOSIS — Z951 Presence of aortocoronary bypass graft: Secondary | ICD-10-CM

## 2019-01-29 DIAGNOSIS — I214 Non-ST elevation (NSTEMI) myocardial infarction: Secondary | ICD-10-CM | POA: Diagnosis not present

## 2019-01-29 NOTE — Progress Notes (Signed)
Daily Session Note  Patient Details  Name: Timothy Simpson MRN: [id number] Date of Birth: 1946/08/25 Referring Provider:     [place] from 11/04/2018 in [place]  Referring Provider  Roxy Manns      Encounter Date: 01/29/2019  Check In: Session Check In - 01/29/19 0815      Check-In   Supervising physician immediately available to respond to emergencies  See telemetry face sheet for immediately available MD    Location  [facility]    Staff Present  Aundra Dubin, RN, BSN;Diane Coad, MS, EP, [facility], Exercise Physiologist;Other    Medication changes reported      No    Fall or balance concerns reported     No    Tobacco Cessation  No Change    Warm-up and Cool-down  Performed as group-led instruction    Resistance Training Performed  Yes    VAD Patient?  No    PAD/SET Patient?  No      Pain Assessment   Currently in Pain?  No/denies    Pain Score  0-No pain    Multiple Pain Sites  No       Capillary Blood Glucose: No results found for this or any previous visit (from the past 24 hour(s)).    Social History   Tobacco Use  Smoking Status Current Every Day Smoker  . Packs/day: 0.25  . Years: 40.00  . Pack years: 10.00  . Types: Cigarettes  Smokeless Tobacco Never Used  Tobacco Comment   he sated that he would quit if he was told he had to.     Goals Met:  Independence with exercise equipment Exercise tolerated well No report of cardiac concerns or symptoms Strength training completed today  Goals Unmet:  Not Applicable  Comments: Pt able to follow exercise prescription today without complaint.  Will continue to monitor for progression. Check out 915.   Dr. Kate Sable is Medical Director for [facility] Cardiac and Pulmonary Rehab.

## 2019-01-30 ENCOUNTER — Encounter (HOSPITAL_COMMUNITY): Payer: Self-pay | Admitting: Emergency Medicine

## 2019-01-30 ENCOUNTER — Other Ambulatory Visit: Payer: Self-pay

## 2019-01-30 ENCOUNTER — Emergency Department (HOSPITAL_COMMUNITY)
Admission: EM | Admit: 2019-01-30 | Discharge: 2019-01-30 | Disposition: A | Discharge status: Home / Self Care | Payer: Medicare HMO | Attending: Emergency Medicine | Admitting: Emergency Medicine

## 2019-01-30 DIAGNOSIS — R350 Frequency of micturition: Secondary | ICD-10-CM | POA: Diagnosis present

## 2019-01-30 DIAGNOSIS — Z7982 Long term (current) use of aspirin: Secondary | ICD-10-CM | POA: Diagnosis not present

## 2019-01-30 DIAGNOSIS — I252 Old myocardial infarction: Secondary | ICD-10-CM | POA: Insufficient documentation

## 2019-01-30 DIAGNOSIS — I10 Essential (primary) hypertension: Secondary | ICD-10-CM | POA: Diagnosis not present

## 2019-01-30 DIAGNOSIS — I2510 Atherosclerotic heart disease of native coronary artery without angina pectoris: Secondary | ICD-10-CM | POA: Insufficient documentation

## 2019-01-30 DIAGNOSIS — F17210 Nicotine dependence, cigarettes, uncomplicated: Secondary | ICD-10-CM | POA: Insufficient documentation

## 2019-01-30 DIAGNOSIS — Z951 Presence of aortocoronary bypass graft: Secondary | ICD-10-CM | POA: Diagnosis not present

## 2019-01-30 DIAGNOSIS — Z79899 Other long term (current) drug therapy: Secondary | ICD-10-CM | POA: Insufficient documentation

## 2019-01-30 DIAGNOSIS — R0981 Nasal congestion: Secondary | ICD-10-CM | POA: Diagnosis not present

## 2019-01-30 DIAGNOSIS — R351 Nocturia: Secondary | ICD-10-CM | POA: Insufficient documentation

## 2019-01-30 LAB — URINALYSIS, ROUTINE W REFLEX MICROSCOPIC
Bilirubin Urine: NEGATIVE
Glucose, UA: NEGATIVE mg/dL
Ketones, ur: NEGATIVE mg/dL
Leukocytes,Ua: NEGATIVE
Nitrite: NEGATIVE
Protein, ur: NEGATIVE mg/dL
Specific Gravity, Urine: 1.011 (ref 1.005–1.030)
pH: 6 (ref 5.0–8.0)

## 2019-01-30 LAB — CBG MONITORING, ED: Glucose-Capillary: 120 mg/dL — ABNORMAL HIGH (ref 70–99)

## 2019-01-30 NOTE — Discharge Instructions (Addendum)
Stop using the nasal spray.  Follow-up with your primary doctor if needed.

## 2019-01-30 NOTE — ED Triage Notes (Signed)
Patient c/o urinary frequency that started last night. Denies any oliguria, dysuria, or hx of diabetes. Patient concerned it's a side affect of allergy medication he took yesterday.

## 2019-01-31 NOTE — ED Provider Notes (Signed)
[facility] EMERGENCY DEPARTMENT Provider Note   CSN: [id number] Arrival date & time: 01/30/19  9562    History   Chief Complaint Chief Complaint  Patient presents with  . Urinary Frequency    HPI ANSEL FERRALL is a 73 y.o. male.     HPI   MAXWELL LEMEN is a 73 y.o. male who presents to the Emergency Department requesting evaluation for less than 24 hours of urinary frequency.  He states that he has had nasal congestion and "cold" symptoms for several days.  He was advised by the pharmacist to take over-the-counter allergy medications and Afrin nasal spray.  That evening, he states that he wakened with 4-5 episodes of nocturia.  He denies fever, chills, abdominal pain, pain with urination, burning or hematuria. No pain or swelling of the testicles.  No history of his current symptoms until taking the nasal spray and decongestant.   Past Medical History:  Diagnosis Date  . Coronary artery disease   . Gout   . High cholesterol   . Hypertension   . NSTEMI (non-ST elevated myocardial infarction) ([place]) 07/29/2018  . 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 Tobacco abuse     Patient Active Problem List   Diagnosis Date Noted  . Hx of adenomatous colonic polyps 01/18/2019  . Anemia 01/18/2019  . 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 Coronary artery disease   . Essential hypertension   . Hyperlipidemia   . Gout   . Tobacco abuse   . NSTEMI (non-ST elevated myocardial infarction) ([place]) 07/29/2018    Past Surgical History:  Procedure Laterality Date  . BACK SURGERY    . COLONOSCOPY  2004   Dr. Gala Romney: Normal.  For history of tubulovillous adenoma back in 2001, five-year follow-up recommended.  . COLONOSCOPY  2009   Dr. Emerson Monte: 6 mm polyp in the ascending segment and one in the sigmoid colon removed.  Pathology showed tubular adenomas.  5-year surveillance  colonoscopy recommended.  . CORONARY ARTERY BYPASS GRAFT N/A 08/05/2018   Procedure: CORONARY ARTERY BYPASS GRAFTING (CABG) x3. Endoscopic Saphenous vein harvest.;  Surgeon: Rexene Alberts, MD;  Location: [place];  Service: Open Heart Surgery;  Laterality: N/A;  . INTRAVASCULAR PRESSURE WIRE/FFR STUDY N/A 08/04/2018   Procedure: INTRAVASCULAR PRESSURE WIRE/FFR STUDY;  Surgeon: Burnell Blanks, MD;  Location: [place] [facility];  Service: Cardiovascular;  Laterality: N/A;  . LEFT HEART CATH AND CORONARY ANGIOGRAPHY N/A 07/30/2018   Procedure: LEFT HEART CATH AND CORONARY ANGIOGRAPHY;  Surgeon: Jettie Booze, MD;  Location: [place] [facility];  Service: Cardiovascular;  Laterality: N/A;  . TEE WITHOUT CARDIOVERSION N/A 08/05/2018   Procedure: TRANSESOPHAGEAL ECHOCARDIOGRAM (TEE);  Surgeon: Rexene Alberts, MD;  Location: [place];  Service: Open Heart Surgery;  Laterality: N/A;        Home Medications    Prior to Admission medications   Medication Sig Start Date End Date Taking? Authorizing Provider  allopurinol (ZYLOPRIM) 100 MG tablet Take 100 mg by mouth daily. 04/28/18   [provider]  aspirin EC 81 MG EC tablet Take 1 tablet (81 mg total) by mouth daily. 08/09/18   Nani Skillern, PA-C  atorvastatin (LIPITOR) 80 MG tablet Take 1 tablet (80 mg total) by mouth daily at 6 PM. 08/09/18   Nani Skillern, PA-C  clopidogrel (PLAVIX) 75 MG tablet  Take 1 tablet (75 mg total) by mouth daily. 08/09/18   Nani Skillern, PA-C  ferrous sulfate 325 (65 FE) MG EC tablet Take 1 tablet (325 mg total) by mouth 2 (two) times daily. 08/11/18 08/11/19  Barrett, Erin R, PA-C  metoprolol tartrate (LOPRESSOR) 25 MG tablet Take 1 tablet (25 mg total) by mouth 2 (two) times daily. 08/11/18   Barrett, Erin R, PA-C  polyethylene glycol-electrolytes (TRILYTE) 420 g solution Take 4,000 mLs by mouth as directed. 01/18/19   Rourk, Cristopher Estimable, MD    Family History Family History  Problem  Relation Age of Onset  . Hypertension Father     Social History Social History   Tobacco Use  . Smoking status: Current Every Day Smoker    Packs/day: 0.25    Years: 40.00    Pack years: 10.00    Types: Cigarettes  . Smokeless tobacco: Never Used  . Tobacco comment: he sated that he would quit if he was told he had to.   Substance Use Topics  . Alcohol use: No  . Drug use: No     Allergies   Patient has no known allergies.   Review of Systems Review of Systems  Constitutional: Negative for activity change, appetite change, chills and fever.  Respiratory: Negative for chest tightness and shortness of breath.   Cardiovascular: Negative for chest pain.  Gastrointestinal: Negative for abdominal pain, nausea and vomiting.  Genitourinary: Positive for frequency. Negative for decreased urine volume, difficulty urinating, dysuria, flank pain, hematuria, penile swelling, scrotal swelling, testicular pain and urgency.  Musculoskeletal: Negative for back pain.  Skin: Negative for rash.  Neurological: Negative for dizziness, syncope, weakness and numbness.  Hematological: Negative for adenopathy.  Psychiatric/Behavioral: Negative for confusion.     Physical Exam Updated Vital Signs BP 133/90 (BP Location: Left Arm)   Pulse 74   Temp 98.1 F (36.7 C) (Oral)   Resp 18   Ht 5\' 11"  (1.803 m)   Wt 89.6 kg   SpO2 95%   BMI 27.56 kg/m   Physical Exam Vitals signs and nursing note reviewed.  Constitutional:      General: He is not in acute distress.    Appearance: Normal appearance. He is not ill-appearing.  Cardiovascular:     Rate and Rhythm: Normal rate and regular rhythm.     Heart sounds: Normal heart sounds.  Pulmonary:     Effort: Pulmonary effort is normal.     Breath sounds: Normal breath sounds. No wheezing.  Abdominal:     Palpations: Abdomen is soft.     Tenderness: There is no abdominal tenderness. There is no right CVA tenderness, left CVA tenderness,  guarding or rebound.  Musculoskeletal: Normal range of motion.  Skin:    General: Skin is warm.     Capillary Refill: Capillary refill takes less than 2 seconds.     Findings: No rash.  Neurological:     Mental Status: He is alert. Mental status is at baseline.      ED Treatments / Results  Labs (all labs ordered are listed, but only abnormal results are displayed) Labs Reviewed  URINALYSIS, ROUTINE W REFLEX MICROSCOPIC - Abnormal; Notable for the following components:      Result Value   Hgb urine dipstick SMALL (*)    Bacteria, UA RARE (*)    All other components within normal limits  CBG MONITORING, ED - Abnormal; Notable for the following components:   Glucose-Capillary 120 (*)  All other components within normal limits  URINE CULTURE  CBG MONITORING, ED    EKG None  Radiology No results found.  Procedures Procedures (including critical care time)  Medications Ordered in ED Medications - No data to display   Initial Impression / Assessment and Plan / ED Course  I have reviewed the triage vital signs and the nursing notes.  Pertinent labs & imaging results that were available during my care of the patient were reviewed by me and considered in my medical decision making (see chart for details).        Pt well appearing.  Vitals reviewed.  Pt is asymptomatic.  Had several episodes of nocturia w/o pain, burning, fever.  Sx's began after taking nasal spray.  Likely secondary rxn.  Doubt infectious process.  Urine culture pending.   Pt advised to d/c the medication.  He was also seen by Dr. Wilson Singer  Pt agrees to plan and out pt f/u.  Return precautions discussed.   Final Clinical Impressions(s) / ED Diagnoses   Final diagnoses:  Urinary frequency    ED Discharge Orders    None       Bufford Lope 01/31/19 2233    Virgel Manifold, MD 02/05/19 1047

## 2019-02-01 ENCOUNTER — Encounter (HOSPITAL_COMMUNITY)
Admission: RE | Admit: 2019-02-01 | Discharge: 2019-02-01 | Disposition: A | Discharge status: Home / Self Care | Payer: Medicare HMO | Source: Ambulatory Visit | Attending: Thoracic Surgery (Cardiothoracic Vascular Surgery) | Admitting: Thoracic Surgery (Cardiothoracic Vascular Surgery)

## 2019-02-01 VITALS — Ht 71.0 in | Wt 197.3 lb

## 2019-02-01 DIAGNOSIS — I214 Non-ST elevation (NSTEMI) myocardial infarction: Secondary | ICD-10-CM | POA: Insufficient documentation

## 2019-02-01 DIAGNOSIS — Z951 Presence of aortocoronary bypass graft: Secondary | ICD-10-CM | POA: Diagnosis present

## 2019-02-01 NOTE — Progress Notes (Signed)
Daily Session Note  Patient Details  Name: Timothy Simpson MRN: [id number] Date of Birth: [birth year] Referring Provider:     [place] from 11/04/2018 in [place]  Referring Provider  Roxy Manns      Encounter Date: 02/01/2019  Check In: Session Check In - 02/01/19 0815      Check-In   Supervising physician immediately available to respond to emergencies  See telemetry face sheet for immediately available MD    Location  [facility]    Staff Present  Aundra Dubin, RN, BSN;Other;Akiel Fennell Zachery Conch, Exercise Physiologist    Medication changes reported      No    Fall or balance concerns reported     No    Tobacco Cessation  No Change    Warm-up and Cool-down  Performed as group-led instruction    Resistance Training Performed  Yes    VAD Patient?  No    PAD/SET Patient?  No      Pain Assessment   Currently in Pain?  No/denies    Pain Score  0-No pain    Multiple Pain Sites  No       Capillary Blood Glucose: No results found for this or any previous visit (from the past 24 hour(s)).    Social History   Tobacco Use  Smoking Status Current Every Day Smoker  . Packs/day: 0.25  . Years: 40.00  . Pack years: 10.00  . Types: Cigarettes  Smokeless Tobacco Never Used  Tobacco Comment   he sated that he would quit if he was told he had to.     Goals Met:  Proper associated with RPD/PD & O2 Sat Independence with exercise equipment Exercise tolerated well No report of cardiac concerns or symptoms Strength training completed today  Goals Unmet:  Not Applicable  Comments: Pt able to follow exercise prescription today without complaint.  Will continue to monitor for progression. Check out 0915.   Dr. Kate Sable is Medical Director for [facility] Cardiac and Pulmonary Rehab.

## 2019-02-02 LAB — URINE CULTURE

## 2019-02-03 ENCOUNTER — Encounter (HOSPITAL_COMMUNITY): Payer: Medicare HMO

## 2019-02-03 ENCOUNTER — Telehealth: Payer: Self-pay | Admitting: *Deleted

## 2019-02-03 NOTE — Progress Notes (Signed)
Discharge Progress Report  Patient Details  Name: Timothy Simpson MRN: [id number] Date of Birth: 07/14/1946 Referring Provider:     [facility] from 11/04/2018 in [place]  Referring Provider  Roxy Manns       Number of Visits: 29  Reason for Discharge:  Patient reached a stable level of exercise. Patient independent in their exercise. Patient has met program and personal goals.  Smoking History:  Social History   Tobacco Use  Smoking Status Current Every Day Smoker  . Packs/day: 0.25  . Years: 40.00  . Pack years: 10.00  . Types: Cigarettes  Smokeless Tobacco Never Used  Tobacco Comment   he sated that he would quit if he was told he had to.     Diagnosis:  NSTEMI (non-ST elevated myocardial infarction) ([place])  S/P CABG x 3  ADL [facility]:   Initial Exercise Prescription: Initial Exercise Prescription - 11/04/18 1000      Date of Initial Exercise RX and Referring Provider   Date  11/04/18    Referring Provider  Roxy Manns    Expected Discharge Date  02/03/19      Treadmill   MPH  1.3    Grade  0    Minutes  17    METs  1.99      NuStep   Level  1    SPM  120    Minutes  22    METs  2.4      Prescription Details   Frequency (times per week)  3    Duration  Progress to 30 minutes of continuous aerobic without signs/symptoms of physical distress      Intensity   THRR 40-80% of Max Heartrate  [phone]    Ratings of Perceived Exertion  11-13    Perceived Dyspnea  0-4      Progression   Progression  Continue to progress workloads to maintain intensity without signs/symptoms of physical distress.      Resistance Training   Training Prescription  Yes    Weight  1    Reps  10-15       Discharge Exercise Prescription (Final Exercise Prescription Changes): Exercise Prescription Changes - 01/26/19 0800      Response to Exercise   Blood Pressure (Admit)  122/74    Blood Pressure (Exercise)  162/58    Blood  Pressure (Exit)  118/70    Heart Rate (Admit)  61 bpm    Heart Rate (Exercise)  97 bpm    Heart Rate (Exit)  70 bpm    Rating of Perceived Exertion (Exercise)  11    Duration  Continue with 30 min of aerobic exercise without signs/symptoms of physical distress.    Intensity  THRR unchanged      Progression   Progression  Continue to progress workloads to maintain intensity without signs/symptoms of physical distress.    Average METs  2.82      Resistance Training   Training Prescription  Yes    Weight  4    Reps  10-15      Treadmill   MPH  2.8    Grade  0    Minutes  17    METs  3.14      NuStep   Level  3    SPM  99    Minutes  22    METs  2.5      Home Exercise Plan   Plans to continue  exercise at  Home (comment)    Frequency  Add 2 additional days to program exercise sessions.    Initial Home Exercises Provided  11/04/18       Functional Capacity: 6 Minute Walk    Row Name 11/04/18 1037 02/02/19 1019       6 Minute Walk   Phase  Initial  Discharge    Distance  1200 feet  1200 feet    Distance % Change  -  0 %    Distance Feet Change  -  0 ft    Walk Time  6 minutes  6 minutes    # of Rest Breaks  0  0    MPH  2.27  2.27    METS  2.74  2.74    RPE  8  8    Perceived Dyspnea   7  6    VO2 Peak  10.26  9.53    Symptoms  No  No    Resting HR  73 bpm  67 bpm    Resting BP  148/78  120/80    Resting Oxygen Saturation   97 %  100 %    Exercise Oxygen Saturation  during 6 min walk  94 %  98 %    Max Ex. HR  83 bpm  79 bpm    Max Ex. BP  154/70  132/80    2 Minute Post BP  144/78  122/82       Psychological, QOL, Others - Outcomes: PHQ 2/9: Depression screen [facility] 2/9 02/03/2019 11/04/2018  Decreased Interest 0 0  Down, Depressed, Hopeless 0 0  PHQ - 2 Score 0 0  Altered sleeping 0 0  Tired, decreased energy 0 0  Change in appetite 0 0  Feeling bad or failure about yourself  0 0  Trouble concentrating 1 0  Moving slowly or fidgety/restless 0 0   Suicidal thoughts 0 0  PHQ-9 Score 1 0    Quality of Life: Quality of Life - 02/02/19 1021      Quality of Life   Select  Quality of Life      Quality of Life Scores   Health/Function Pre  27.43 %    Health/Function Post  27.57 %    Health/Function % Change  0.51 %    Socioeconomic Pre  28 %    Socioeconomic Post  27.75 %    Socioeconomic % Change   -0.89 %    Psych/Spiritual Pre  28.21 %    Psych/Spiritual Post  30 %    Psych/Spiritual % Change  6.35 %    Family Pre  27.6 %    Family Post  28.8 %    Family % Change  4.35 %    GLOBAL Pre  27.73 %    GLOBAL Post  28.33 %    GLOBAL % Change  2.16 %       Personal Goals: Goals established at orientation with interventions provided to work toward goal. Personal Goals and Risk Factors at Admission - 11/04/18 1123      Core Components/Risk Factors/Patient Goals on Admission    Weight Management  Weight Maintenance    Personal Goal Other  Yes    Personal Goal  To get a good report about his heart.     Intervention  Attend CR 3 x week and supplement with 2 x week at home exercise.     Expected Outcomes  Achieve personal goals.  Personal Goals Discharge: Goals and Risk Factor Review    Row Name 11/19/18 1025 12/10/18 1248 01/07/19 1017 02/03/19 1558       Core Components/Risk Factors/Patient Goals Review   Personal Goals Review  Weight Management/Obesity Improve heart; get good new about heart.   Weight Management/Obesity;Tobacco Cessation Improve heart; get back to work; increase strength and stamina.   Weight Management/Obesity;Tobacco Cessation Improve heart; get good news about heart.   Weight Management/Obesity;Tobacco Cessation Improve heart; get good news about heart.     Review  Patient is new to the program completeing 6 sessions gaining 2 lbs since his initial visit. He has been progressed and is doing well so far. Will continue to monitor for progress.   Patient has completed 13 sessions gaining 2 lbs since  last 30 day review. He is doing well in the program with progression. He says he is doing this because this is what the doctor wants him to do. He continues to smoke but is down to 6 cigarettes/day. Will continue to monitor for progress.   Patient has completed 25 sessions gaining 1 lbs since last 30 day review. He continues to do well in the program with progression. He states he is learing more about the body and his condition and he likes it. He is smoking 6 cigarretts/day. He states he does not smoke in his house or around his wife and children. Will continue to monitor for progress.   Patient graduated with 36 sessions gaining 4.3 lbs overall; however, his waist and hip measurements both decreased by 1 inch and 0.5 inches. He did well in the program. His exit measurments increased in grip strength. His walk test was the same. He states he learned a lot from the education sessions and he is breathing better and is working more in his garden and is able to work on [employer] gardens a well. He feels better overall. He continues to smoke 5 to 6 cigarettes/day and hopes to quit totally. He feels the program was a success and that he meet his goals. He plans to continue exercising by staying active working in his gardens and by walking. CR will f/u for 1 year.     Expected Outcomes  Patient will continue to attend sessions and complete the program meeting his personal goals.   Patient will continue to attend sessions and complete the program meeting his personal goals.   Patient will continue to attend sessions and complete the program meeting his personal goals.   Patient will continue exercising by walking and working in his gardens and continue to meet his goals.        Exercise Goals and Review: Exercise Goals    Row Name 11/04/18 1041             Exercise Goals   Increase Physical Activity  Yes       Intervention  Provide advice, education, support and counseling about physical  activity/exercise needs.;Develop an individualized exercise prescription for aerobic and resistive training based on initial evaluation findings, risk stratification, comorbidities and participant's personal goals.       Expected Outcomes  Short Term: Attend rehab on a regular basis to increase amount of physical activity.       Increase Strength and Stamina  Yes       Intervention  Provide advice, education, support and counseling about physical activity/exercise needs.;Develop an individualized exercise prescription for aerobic and resistive training based on initial evaluation findings, risk stratification, comorbidities and  participant's personal goals.       Expected Outcomes  Short Term: Increase workloads from initial exercise prescription for resistance, speed, and METs.       Able to understand and use rate of perceived exertion (RPE) scale  Yes       Intervention  Provide education and explanation on how to use RPE scale       Expected Outcomes  Short Term: Able to use RPE daily in rehab to express subjective intensity level;Long Term:  Able to use RPE to guide intensity level when exercising independently       Able to understand and use Dyspnea scale  Yes       Intervention  Provide education and explanation on how to use Dyspnea scale       Expected Outcomes  Short Term: Able to use Dyspnea scale daily in rehab to express subjective sense of shortness of breath during exertion;Long Term: Able to use Dyspnea scale to guide intensity level when exercising independently       Knowledge and understanding of Target Heart Rate Range (THRR)  Yes       Intervention  Provide education and explanation of THRR including how the numbers were predicted and where they are located for reference       Expected Outcomes  Short Term: Able to state/look up THRR       Able to check pulse independently  Yes       Intervention  Provide education and demonstration on how to check pulse in carotid and radial  arteries.;Review the importance of being able to check your own pulse for safety during independent exercise       Expected Outcomes  Short Term: Able to explain why pulse checking is important during independent exercise;Long Term: Able to check pulse independently and accurately       Understanding of Exercise Prescription  Yes       Intervention  Provide education, explanation, and written materials on patient's individual exercise prescription       Expected Outcomes  Short Term: Able to explain program exercise prescription;Long Term: Able to explain home exercise prescription to exercise independently          Exercise Goals Re-Evaluation: Exercise Goals Re-Evaluation    Row Name 11/17/18 1553 12/08/18 1537 01/05/19 0835         Exercise Goal Re-Evaluation   Exercise Goals Review  Increase Physical Activity;Able to understand and use rate of perceived exertion (RPE) scale;Increase Strength and Stamina;Able to check pulse independently;Understanding of Exercise Prescription;Knowledge and understanding of Target Heart Rate Range (THRR)  Increase Physical Activity;Able to understand and use rate of perceived exertion (RPE) scale;Increase Strength and Stamina;Able to check pulse independently;Understanding of Exercise Prescription;Knowledge and understanding of Target Heart Rate Range (THRR)  Increase Physical Activity;Able to understand and use rate of perceived exertion (RPE) scale;Increase Strength and Stamina;Able to check pulse independently;Understanding of Exercise Prescription;Knowledge and understanding of Target Heart Rate Range (THRR)     Comments  Pt. has attended 5 exercise sessions so far. He has tolerated the exercise well. Will continue to monitor and progress as we see fit.   Pt. has attended rehab regularly. He has done well in the program and has handled his progressions with ease. We will continue to monitor and increase his workloads as necessary.   Pt. continues to do well in  rehab. He is the most regular attending patient we have. He continues to handle all exercise with ease. We  will continue to progress as tolerated.      Expected Outcomes  improve heart health.   improve heart health.   improve heart health and decrease smoking         Nutrition & Weight - Outcomes: Pre Biometrics - 11/04/18 1041      Pre Biometrics   Height  '6\' 1"'  (1.854 m)    Waist Circumference  39.5 inches    Hip Circumference  40 inches    Waist to Hip Ratio  0.99 %    Triceps Skinfold  4 mm    % Body Fat  20.9 %    Grip Strength  16.9 kg    Flexibility  0 in    Single Leg Stand  60 seconds      Post Biometrics - 02/02/19 1020       Post  Biometrics   Height  '5\' 11"'  (1.803 m)    Weight  89.5 kg    Waist Circumference  38 inches    Hip Circumference  39.5 inches    Waist to Hip Ratio  0.96 %    BMI (Calculated)  27.53    Triceps Skinfold  4 mm    % Body Fat  20.4 %    Grip Strength  17.8 kg    Flexibility  0 in    Single Leg Stand  60 seconds       Nutrition: Nutrition Therapy & Goals - 01/07/19 1014      Nutrition Therapy   RD appointment deferred  Yes      Personal Nutrition Goals   Comments  Patient did not attend RD appointment. He states he is eating more toss salads than he use to. Will continue to monitor for progress.       Intervention Plan   Intervention  Nutrition handout(s) given to patient.       Nutrition Discharge: Nutrition Assessments - 02/03/19 1556      MEDFICTS Scores   Pre Score  37    Post Score  24    Score Difference  -13       Education Questionnaire Score: Knowledge Questionnaire Score - 02/03/19 1556      Knowledge Questionnaire Score   Pre Score  24/28    Post Score  24/28       Goals reviewed with patient; copy given to patient.

## 2019-02-03 NOTE — Telephone Encounter (Signed)
Post ED Visit - Positive Culture Follow-up  Culture report reviewed by antimicrobial stewardship pharmacist: [facility] Team []  Elenor Quinones, Pharm.D. []  Heide Guile, Pharm.D., BCPS AQ-ID []  Parks Neptune, Pharm.D., BCPS []  Alycia Rossetti, Pharm.D., BCPS []  [place], Pharm.D., BCPS, AAHIVP []  Legrand Como, Pharm.D., BCPS, AAHIVP []  Salome Arnt, PharmD, BCPS []  Johnnette Gourd, PharmD, BCPS []  Hughes Better, PharmD, BCPS []  Leeroy Cha, PharmD []  Laqueta Linden, PharmD, BCPS []  Albertina Parr, PharmD Janae Bridgeman , PharmD  [facility] []  Leodis Sias, PharmD []  Lindell Spar, PharmD []  Royetta Asal, PharmD []  Graylin Shiver, Rph []  Rema Fendt) Glennon Mac, PharmD []  Arlyn Dunning, PharmD []  Netta Cedars, PharmD []  Dia Sitter, PharmD []  Leone Haven, PharmD []  Gretta Arab, PharmD []  Theodis Shove, PharmD []  Peggyann Juba, PharmD []  Reuel Boom, PharmD   Positive urine culture reviewed by Suella Broad, PA No further abx needed and no further patient follow-up is required at this time.  Harlon Flor [facility] 02/03/2019, 8:48 AM

## 2019-02-03 NOTE — Progress Notes (Signed)
Cardiac Individual Treatment Plan  Patient Details  Name: Timothy Simpson MRN: [id number] Date of Birth: [birth year] Referring Provider:     [place] from 11/04/2018 in [place]  Referring Provider  Roxy Manns      Initial Encounter Date:    CARDIAC REHAB PHASE II ORIENTATION from 11/04/2018 in [facility]  Date  11/04/18      Visit Diagnosis: NSTEMI (non-ST elevated myocardial infarction) ([place])  S/P CABG x 3  Patient's Home Medications on Admission:  Current Outpatient Medications:  .  allopurinol (ZYLOPRIM) 100 MG tablet, Take 100 mg by mouth daily., Disp: , Rfl:  .  aspirin EC 81 MG EC tablet, Take 1 tablet (81 mg total) by mouth daily., Disp: , Rfl:  .  atorvastatin (LIPITOR) 80 MG tablet, Take 1 tablet (80 mg total) by mouth daily at 6 PM., Disp: 30 tablet, Rfl: 1 .  clopidogrel (PLAVIX) 75 MG tablet, Take 1 tablet (75 mg total) by mouth daily., Disp: 30 tablet, Rfl: 1 .  ferrous sulfate 325 (65 FE) MG EC tablet, Take 1 tablet (325 mg total) by mouth 2 (two) times daily., Disp: 60 tablet, Rfl: 3 .  metoprolol tartrate (LOPRESSOR) 25 MG tablet, Take 1 tablet (25 mg total) by mouth 2 (two) times daily., Disp: 60 tablet, Rfl: 3 .  polyethylene glycol-electrolytes (TRILYTE) 420 g solution, Take 4,000 mLs by mouth as directed., Disp: 4000 mL, Rfl: 0  Past Medical History: Past Medical History:  Diagnosis Date  . Coronary artery disease   . Gout   . High cholesterol   . Hypertension   . NSTEMI (non-ST elevated myocardial infarction) ([place]) 07/29/2018  . 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 Tobacco abuse     Tobacco Use: Social History   Tobacco Use  Smoking Status Current Every Day Smoker  . Packs/day: 0.25  . Years: 40.00  . Pack years: 10.00  . Types: Cigarettes  Smokeless Tobacco Never  Used  Tobacco Comment   he sated that he would quit if he was told he had to.     Labs: Recent Review Flowsheet Data    Labs for ITP Cardiac and Pulmonary Rehab Latest Ref Rng & Units 08/05/2018 08/05/2018 08/05/2018 08/06/2018 08/06/2018   Cholestrol 0 - 200 mg/dL - - - - -   LDLCALC 0 - 99 mg/dL - - - - -   HDL >40 mg/dL - - - - -   Trlycerides <150 mg/dL - - - - -   Hemoglobin A1c 4.8 - 5.6 % - - - - -   PHART 7.350 - 7.450 7.310(L) - 7.304(L) 7.300(L) -   PCO2ART 32.0 - 48.0 mmHg 43.8 - 42.4 41.2 -   HCO3 20.0 - 28.0 mmol/L 22.0 - 21.0 20.1 -   TCO2 22 - 32 mmol/L 23 21(L) 22 21(L) 22   ACIDBASEDEF 0.0 - 2.0 mmol/L 4.0(H) - 5.0(H) 6.0(H) -   O2SAT % 99.0 - 99.0 99.0 -      Capillary Blood Glucose: Lab Results  Component Value Date   GLUCAP 120 (H) 01/30/2019   GLUCAP 87 08/08/2018   GLUCAP 100 (H) 08/08/2018   GLUCAP 99 08/08/2018   GLUCAP 124 (H) 08/07/2018     Exercise Target Goals: Exercise Program Goal: Individual exercise prescription set using results from initial 6 min walk test  and THRR while considering  patient's activity barriers and safety.   Exercise Prescription Goal: Starting with aerobic activity 30 plus minutes a day, 3 days per week for initial exercise prescription. Provide home exercise prescription and guidelines that participant acknowledges understanding prior to discharge.  Activity Barriers & Risk Stratification: Activity Barriers & Cardiac Risk Stratification - 11/04/18 1038      Activity Barriers & Cardiac Risk Stratification   Cardiac Risk Stratification  High       6 Minute Walk: 6 Minute Walk    Row Name 11/04/18 1037 02/02/19 1019       6 Minute Walk   Phase  Initial  Discharge    Distance  1200 feet  1200 feet    Distance % Change  -  0 %    Distance Feet Change  -  0 ft    Walk Time  6 minutes  6 minutes    # of Rest Breaks  0  0    MPH  2.27  2.27    METS  2.74  2.74    RPE  8  8    Perceived Dyspnea   7  6    VO2 Peak  10.26   9.53    Symptoms  No  No    Resting HR  73 bpm  67 bpm    Resting BP  148/78  120/80    Resting Oxygen Saturation   97 %  100 %    Exercise Oxygen Saturation  during 6 min walk  94 %  98 %    Max Ex. HR  83 bpm  79 bpm    Max Ex. BP  154/70  132/80    2 Minute Post BP  144/78  122/82       Oxygen Initial Assessment:   Oxygen Re-Evaluation:   Oxygen Discharge (Final Oxygen Re-Evaluation):   Initial Exercise Prescription: Initial Exercise Prescription - 11/04/18 1000      Date of Initial Exercise RX and Referring Provider   Date  11/04/18    Referring Provider  Roxy Manns    Expected Discharge Date  02/03/19      Treadmill   MPH  1.3    Grade  0    Minutes  17    METs  1.99      NuStep   Level  1    SPM  120    Minutes  22    METs  2.4      Prescription Details   Frequency (times per week)  3    Duration  Progress to 30 minutes of continuous aerobic without signs/symptoms of physical distress      Intensity   THRR 40-80% of Max Heartrate  [phone]    Ratings of Perceived Exertion  11-13    Perceived Dyspnea  0-4      Progression   Progression  Continue to progress workloads to maintain intensity without signs/symptoms of physical distress.      Resistance Training   Training Prescription  Yes    Weight  1    Reps  10-15       Perform Capillary Blood Glucose checks as needed.  Exercise Prescription Changes:  Exercise Prescription Changes    Row Name 11/04/18 1000 11/17/18 1500 12/03/18 0800 12/16/18 1400 12/29/18 1200     Response to Exercise   Blood Pressure (Admit)  148/78  130/70  126/84  116/70  150/80   Blood Pressure (Exercise)  154/70  132/70  140/88  132/88  156/80   Blood Pressure (Exit)  144/78  126/80  122/66  122/64  136/80   Heart Rate (Admit)  73 bpm  64 bpm  75 bpm  65 bpm  60 bpm   Heart Rate (Exercise)  83 bpm  87 bpm  85 bpm  82 bpm  85 bpm   Heart Rate (Exit)  68 bpm  73 bpm  71 bpm  73 bpm  69 bpm   Rating of Perceived Exertion  (Exercise)  '8  11  11  11  11   ' Perceived Dyspnea (Exercise)  7  -  -  -  -   Comments  6 minute walk test  First two weeks of exercise  -  -  increase in overall MET level    Duration  Progress to 30 minutes of  aerobic without signs/symptoms of physical distress  Progress to 30 minutes of  aerobic without signs/symptoms of physical distress  Continue with 30 min of aerobic exercise without signs/symptoms of physical distress.  Continue with 30 min of aerobic exercise without signs/symptoms of physical distress.  Continue with 30 min of aerobic exercise without signs/symptoms of physical distress.   Intensity  THRR New [phone]  THRR unchanged  THRR unchanged  THRR unchanged  THRR unchanged     Progression   Progression  -  -  Continue to progress workloads to maintain intensity without signs/symptoms of physical distress.  Continue to progress workloads to maintain intensity without signs/symptoms of physical distress.  Continue to progress workloads to maintain intensity without signs/symptoms of physical distress.   Average METs  -  -  2.36  2.46  2.61     Resistance Training   Training Prescription  -  Yes  Yes  Yes  Yes   Weight  -  '2  2  2  3   ' Reps  -  10-15  10-15  10-15  10-15     Treadmill   MPH  -  1.5  2  2.4  2.5   Grade  -  0  0  0  0   Minutes  -  '17  17  17  17   ' METs  -  2.1  2.53  2.83  2.91     NuStep   Level  -  '1  1  2  2   ' SPM  -  91  89  91  98   Minutes  -  '22  22  22  22   ' METs  -  2.3  2.2  2.2  2.3     Home Exercise Plan   Plans to continue exercise at  Home (comment) walking  Home (comment)  -  Home (comment)  Home (comment)   Frequency  Add 2 additional days to program exercise sessions.  Add 2 additional days to program exercise sessions.  -  Add 2 additional days to program exercise sessions.  Add 2 additional days to program exercise sessions.   Initial Home Exercises Provided  11/04/18  11/04/18  -  11/04/18  11/04/18   Row Name 01/15/19 1400  01/26/19 0800           Response to Exercise   Blood Pressure (Admit)  122/72  122/74      Blood Pressure (Exercise)  160/70  162/58      Blood Pressure (Exit)  110/58  118/70  Heart Rate (Admit)  61 bpm  61 bpm      Heart Rate (Exercise)  83 bpm  97 bpm      Heart Rate (Exit)  74 bpm  70 bpm      Rating of Perceived Exertion (Exercise)  11  11      Duration  Continue with 30 min of aerobic exercise without signs/symptoms of physical distress.  Continue with 30 min of aerobic exercise without signs/symptoms of physical distress.      Intensity  THRR unchanged  THRR unchanged        Progression   Progression  Continue to progress workloads to maintain intensity without signs/symptoms of physical distress.  Continue to progress workloads to maintain intensity without signs/symptoms of physical distress.      Average METs  2.7  2.82        Resistance Training   Training Prescription  Yes  Yes      Weight  3  4      Reps  10-15  10-15        Treadmill   MPH  2.6  2.8      Grade  0  0      Minutes  17  17      METs  2.99  3.14        NuStep   Level  3  3      SPM  93  99      Minutes  22  22      METs  2.4  2.5        Home Exercise Plan   Plans to continue exercise at  Home (comment)  Home (comment)      Frequency  Add 2 additional days to program exercise sessions.  Add 2 additional days to program exercise sessions.      Initial Home Exercises Provided  11/04/18  11/04/18         Exercise Comments:  Exercise Comments    Row Name 11/17/18 1554 12/08/18 1539 01/05/19 0836 02/01/19 0847     Exercise Comments  Pt. has done well so far. He has attended 5 exercise sessions and is always willing to do what is asked. Will progress as tolerated.   Pt. continues to do well in the program. He has attended 12 exercise sessions and has tolerated all of his progressions with ease.   Pt. has attended 24 exercise sessions. He is up to 2.6 MPH on the TM and level 3 on the NuStep. He  always pushes himself to go alittle farther than the day before.   Delton graduated today from  rehab with 36 sessions completed.  Details of the patient's exercise prescription and what He needs to do in order to continue the prescription and progress were discussed with patient.  Patient was given a copy of prescription and goals.  Patient verbalized understanding.  Kydan plans to continue to exercise by walking at home and working in his yard.       Exercise Goals and Review:  Exercise Goals    Row Name 11/04/18 1041             Exercise Goals   Increase Physical Activity  Yes       Intervention  Provide advice, education, support and counseling about physical activity/exercise needs.;Develop an individualized exercise prescription for aerobic and resistive training based on initial evaluation findings, risk stratification, comorbidities and participant's personal goals.  Expected Outcomes  Short Term: Attend rehab on a regular basis to increase amount of physical activity.       Increase Strength and Stamina  Yes       Intervention  Provide advice, education, support and counseling about physical activity/exercise needs.;Develop an individualized exercise prescription for aerobic and resistive training based on initial evaluation findings, risk stratification, comorbidities and participant's personal goals.       Expected Outcomes  Short Term: Increase workloads from initial exercise prescription for resistance, speed, and METs.       Able to understand and use rate of perceived exertion (RPE) scale  Yes       Intervention  Provide education and explanation on how to use RPE scale       Expected Outcomes  Short Term: Able to use RPE daily in rehab to express subjective intensity level;Long Term:  Able to use RPE to guide intensity level when exercising independently       Able to understand and use Dyspnea scale  Yes       Intervention  Provide education and explanation on how to  use Dyspnea scale       Expected Outcomes  Short Term: Able to use Dyspnea scale daily in rehab to express subjective sense of shortness of breath during exertion;Long Term: Able to use Dyspnea scale to guide intensity level when exercising independently       Knowledge and understanding of Target Heart Rate Range (THRR)  Yes       Intervention  Provide education and explanation of THRR including how the numbers were predicted and where they are located for reference       Expected Outcomes  Short Term: Able to state/look up THRR       Able to check pulse independently  Yes       Intervention  Provide education and demonstration on how to check pulse in carotid and radial arteries.;Review the importance of being able to check your own pulse for safety during independent exercise       Expected Outcomes  Short Term: Able to explain why pulse checking is important during independent exercise;Long Term: Able to check pulse independently and accurately       Understanding of Exercise Prescription  Yes       Intervention  Provide education, explanation, and written materials on patient's individual exercise prescription       Expected Outcomes  Short Term: Able to explain program exercise prescription;Long Term: Able to explain home exercise prescription to exercise independently          Exercise Goals Re-Evaluation : Exercise Goals Re-Evaluation    Row Name 11/17/18 1553 12/08/18 1537 01/05/19 0835         Exercise Goal Re-Evaluation   Exercise Goals Review  Increase Physical Activity;Able to understand and use rate of perceived exertion (RPE) scale;Increase Strength and Stamina;Able to check pulse independently;Understanding of Exercise Prescription;Knowledge and understanding of Target Heart Rate Range (THRR)  Increase Physical Activity;Able to understand and use rate of perceived exertion (RPE) scale;Increase Strength and Stamina;Able to check pulse independently;Understanding of Exercise  Prescription;Knowledge and understanding of Target Heart Rate Range (THRR)  Increase Physical Activity;Able to understand and use rate of perceived exertion (RPE) scale;Increase Strength and Stamina;Able to check pulse independently;Understanding of Exercise Prescription;Knowledge and understanding of Target Heart Rate Range (THRR)     Comments  Pt. has attended 5 exercise sessions so far. He has tolerated the exercise well. Will continue to  monitor and progress as we see fit.   Pt. has attended rehab regularly. He has done well in the program and has handled his progressions with ease. We will continue to monitor and increase his workloads as necessary.   Pt. continues to do well in rehab. He is the most regular attending patient we have. He continues to handle all exercise with ease. We will continue to progress as tolerated.      Expected Outcomes  improve heart health.   improve heart health.   improve heart health and decrease smoking          Discharge Exercise Prescription (Final Exercise Prescription Changes): Exercise Prescription Changes - 01/26/19 0800      Response to Exercise   Blood Pressure (Admit)  122/74    Blood Pressure (Exercise)  162/58    Blood Pressure (Exit)  118/70    Heart Rate (Admit)  61 bpm    Heart Rate (Exercise)  97 bpm    Heart Rate (Exit)  70 bpm    Rating of Perceived Exertion (Exercise)  11    Duration  Continue with 30 min of aerobic exercise without signs/symptoms of physical distress.    Intensity  THRR unchanged      Progression   Progression  Continue to progress workloads to maintain intensity without signs/symptoms of physical distress.    Average METs  2.82      Resistance Training   Training Prescription  Yes    Weight  4    Reps  10-15      Treadmill   MPH  2.8    Grade  0    Minutes  17    METs  3.14      NuStep   Level  3    SPM  99    Minutes  22    METs  2.5      Home Exercise Plan   Plans to continue exercise at  Home  (comment)    Frequency  Add 2 additional days to program exercise sessions.    Initial Home Exercises Provided  11/04/18       Nutrition:  Target Goals: Understanding of nutrition guidelines, daily intake of sodium <1572m, cholesterol <2072m calories 30% from fat and 7% or less from saturated fats, daily to have 5 or more servings of fruits and vegetables.  Biometrics: Pre Biometrics - 11/04/18 1041      Pre Biometrics   Height  '6\' 1"'  (1.854 m)    Waist Circumference  39.5 inches    Hip Circumference  40 inches    Waist to Hip Ratio  0.99 %    Triceps Skinfold  4 mm    % Body Fat  20.9 %    Grip Strength  16.9 kg    Flexibility  0 in    Single Leg Stand  60 seconds      Post Biometrics - 02/02/19 1020       Post  Biometrics   Height  '5\' 11"'  (1.803 m)    Weight  89.5 kg    Waist Circumference  38 inches    Hip Circumference  39.5 inches    Waist to Hip Ratio  0.96 %    BMI (Calculated)  27.53    Triceps Skinfold  4 mm    % Body Fat  20.4 %    Grip Strength  17.8 kg    Flexibility  0 in    Single Leg Stand  60 seconds       Nutrition Therapy Plan and Nutrition Goals: Nutrition Therapy & Goals - 01/07/19 1014      Nutrition Therapy   RD appointment deferred  Yes      Personal Nutrition Goals   Comments  Patient did not attend RD appointment. He states he is eating more toss salads than he use to. Will continue to monitor for progress.       Intervention Plan   Intervention  Nutrition handout(s) given to patient.       Nutrition Assessments: Nutrition Assessments - 02/03/19 1556      MEDFICTS Scores   Pre Score  37    Post Score  24    Score Difference  -13       Nutrition Goals Re-Evaluation:   Nutrition Goals Discharge (Final Nutrition Goals Re-Evaluation):   Psychosocial: Target Goals: Acknowledge presence or absence of significant depression and/or stress, maximize coping skills, provide positive support system. Participant is able to  verbalize types and ability to use techniques and skills needed for reducing stress and depression.  Initial Review & Psychosocial Screening: Initial Psych Review & Screening - 11/04/18 1112      Initial Review   Current issues with  None Identified      Family Dynamics   Good Support System?  Yes      Barriers   Psychosocial barriers to participate in program  There are no identifiable barriers or psychosocial needs.      Screening Interventions   Interventions  Encouraged to exercise    Expected Outcomes  Short Term goal: Identification and review with participant of any Quality of Life or Depression concerns found by scoring the questionnaire.;Long Term goal: The participant improves quality of Life and PHQ9 Scores as seen by post scores and/or verbalization of changes       Quality of Life Scores: Quality of Life - 02/02/19 1021      Quality of Life   Select  Quality of Life      Quality of Life Scores   Health/Function Pre  27.43 %    Health/Function Post  27.57 %    Health/Function % Change  0.51 %    Socioeconomic Pre  28 %    Socioeconomic Post  27.75 %    Socioeconomic % Change   -0.89 %    Psych/Spiritual Pre  28.21 %    Psych/Spiritual Post  30 %    Psych/Spiritual % Change  6.35 %    Family Pre  27.6 %    Family Post  28.8 %    Family % Change  4.35 %    GLOBAL Pre  27.73 %    GLOBAL Post  28.33 %    GLOBAL % Change  2.16 %      Scores of 19 and below usually indicate a poorer quality of life in these areas.  A difference of  2-3 points is a clinically meaningful difference.  A difference of 2-3 points in the total score of the Quality of Life Index has been associated with significant improvement in overall quality of life, self-image, physical symptoms, and general health in studies assessing change in quality of life.  PHQ-9: Recent Review Flowsheet Data    Depression screen [facility] 2/9 02/03/2019 11/04/2018   Decreased Interest 0 0   Down, Depressed,  Hopeless 0 0   PHQ - 2 Score 0 0   Altered sleeping 0 0   Tired, decreased energy  0 0   Change in appetite 0 0   Feeling bad or failure about yourself  0 0   Trouble concentrating 1 0   Moving slowly or fidgety/restless 0 0   Suicidal thoughts 0 0   PHQ-9 Score 1 0     Interpretation of Total Score  Total Score Depression Severity:  1-4 = Minimal depression, 5-9 = Mild depression, 10-14 = Moderate depression, 15-19 = Moderately severe depression, 20-27 = Severe depression   Psychosocial Evaluation and Intervention: Psychosocial Evaluation - 02/03/19 1603      Discharge Psychosocial Assessment & Intervention   Comments  Patient has no psychosocial issues identified at discharge. His exit QOL score increased by 2.17% at 28.33% and his PHQ-9 score went from 0 to 1.        Psychosocial Re-Evaluation: Psychosocial Re-Evaluation    Row Name 11/19/18 0853 12/10/18 1250 01/07/19 1020         Psychosocial Re-Evaluation   Current issues with  None Identified  None Identified  None Identified     Comments  Patient's initial QOL score was 27.73 and his PHQ-9 score was 0 with no psychosocial issues identified.  Patient's initial QOL score was 27.73 and his PHQ-9 score was 0 with no psychosocial issues identified.  Patient's initial QOL score was 27.73 and his PHQ-9 score was 0 with no psychosocial issues identified.     Expected Outcomes  Patient will have no psychosocial issues identified at discharge.   Patient will have no psychosocial issues identified at discharge.   Patient will have no psychosocial issues identified at discharge.      Interventions  Relaxation education;Stress management education;Encouraged to attend Cardiac Rehabilitation for the exercise  Relaxation education;Stress management education;Encouraged to attend Cardiac Rehabilitation for the exercise  Relaxation education;Stress management education;Encouraged to attend Cardiac Rehabilitation for the exercise     Continue  Psychosocial Services   No Follow up required  No Follow up required  No Follow up required        Psychosocial Discharge (Final Psychosocial Re-Evaluation): Psychosocial Re-Evaluation - 01/07/19 1020      Psychosocial Re-Evaluation   Current issues with  None Identified    Comments  Patient's initial QOL score was 27.73 and his PHQ-9 score was 0 with no psychosocial issues identified.    Expected Outcomes  Patient will have no psychosocial issues identified at discharge.     Interventions  Relaxation education;Stress management education;Encouraged to attend Cardiac Rehabilitation for the exercise    Continue Psychosocial Services   No Follow up required       Vocational Rehabilitation: Provide vocational rehab assistance to qualifying candidates.   Vocational Rehab Evaluation & Intervention: Vocational Rehab - 11/04/18 1122      Initial Vocational Rehab Evaluation & Intervention   Assessment shows need for Vocational Rehabilitation  No       Education: Education Goals: Education classes will be provided on a weekly basis, covering required topics. Participant will state understanding/return demonstration of topics presented.  Learning Barriers/Preferences: Learning Barriers/Preferences - 11/04/18 1121      Learning Barriers/Preferences   Learning Barriers  None    Learning Preferences  Verbal Instruction;Skilled Demonstration;Individual Instruction       Education Topics: Hypertension, Hypertension Reduction -Define heart disease and high blood pressure. Discus how high blood pressure affects the body and ways to reduce high blood pressure.   CARDIAC REHAB PHASE II EXERCISE from 01/27/2019 in [place]  Date  01/06/19  Educator  DJ  Instruction Review Code  2- Demonstrated Understanding      Exercise and Your Heart -Discuss why it is important to exercise, the FITT principles of exercise, normal and abnormal responses to exercise, and how to  exercise safely.   CARDIAC REHAB PHASE II EXERCISE from 01/27/2019 in [place]  Date  01/13/19  Educator  Wynetta Emery  Instruction Review Code  2- Demonstrated Understanding      Angina -Discuss definition of angina, causes of angina, treatment of angina, and how to decrease risk of having angina.   CARDIAC REHAB PHASE II EXERCISE from 01/27/2019 in [place]  Date  01/20/19  Educator  Wynetta Emery  Instruction Review Code  2- Demonstrated Understanding      Cardiac Medications -Review what the following cardiac medications are used for, how they affect the body, and side effects that may occur when taking the medications.  Medications include Aspirin, Beta blockers, calcium channel blockers, ACE Inhibitors, angiotensin receptor blockers, diuretics, digoxin, and antihyperlipidemics.   CARDIAC REHAB PHASE II EXERCISE from 01/27/2019 in [place]  Date  01/27/19  Educator  Wynetta Emery  Instruction Review Code  2- Demonstrated Understanding      Congestive Heart Failure -Discuss the definition of CHF, how to live with CHF, the signs and symptoms of CHF, and how keep track of weight and sodium intake.   Heart Disease and Intimacy -Discus the effect sexual activity has on the heart, how changes occur during intimacy as we age, and safety during sexual activity.   CARDIAC REHAB PHASE II EXERCISE from 01/27/2019 in [place]  Date  11/11/18  Educator  Etheleen Mayhew  Instruction Review Code  2- Demonstrated Understanding      Smoking Cessation / COPD -Discuss different methods to quit smoking, the health benefits of quitting smoking, and the definition of COPD.   Nutrition I: Fats -Discuss the types of cholesterol, what cholesterol does to the heart, and how cholesterol levels can be controlled.   Nutrition II: Labels -Discuss the different components of food labels and how to read food label    CARDIAC REHAB PHASE II EXERCISE from 01/27/2019 in [place]  Date  12/09/18  Educator  DC  Instruction Review Code  2- Demonstrated Understanding      Heart Parts/Heart Disease and PAD -Discuss the anatomy of the heart, the pathway of blood circulation through the heart, and these are affected by heart disease.   CARDIAC REHAB PHASE II EXERCISE from 01/27/2019 in [place]  Date  12/04/18  Educator  Wynetta Emery  Instruction Review Code  2- Demonstrated Understanding      Stress I: Signs and Symptoms -Discuss the causes of stress, how stress may lead to anxiety and depression, and ways to limit stress.   CARDIAC REHAB PHASE II EXERCISE from 01/27/2019 in [place]  Date  12/16/18  Educator  DJ  Instruction Review Code  2- Demonstrated Understanding      Stress II: Relaxation -Discuss different types of relaxation techniques to limit stress.   CARDIAC REHAB PHASE II EXERCISE from 01/27/2019 in [place]  Date  12/23/18  Educator  DJ  Instruction Review Code  2- Demonstrated Understanding      Warning Signs of Stroke / TIA -Discuss definition of a stroke, what the signs and symptoms are of a stroke, and how to identify when someone is having stroke.   CARDIAC REHAB PHASE  II EXERCISE from 01/27/2019 in [place]  Date  12/30/18  Educator  DJ  Instruction Review Code  2- Demonstrated Understanding      Knowledge Questionnaire Score: Knowledge Questionnaire Score - 02/03/19 1556      Knowledge Questionnaire Score   Pre Score  24/28    Post Score  24/28       Core Components/Risk Factors/Patient Goals at Admission: Personal Goals and Risk Factors at Admission - 11/04/18 1123      Core Components/Risk Factors/Patient Goals on Admission    Weight Management  Weight Maintenance    Personal Goal Other  Yes    Personal Goal  To get a good report about his  heart.     Intervention  Attend CR 3 x week and supplement with 2 x week at home exercise.     Expected Outcomes  Achieve personal goals.        Core Components/Risk Factors/Patient Goals Review:  Goals and Risk Factor Review    Row Name 11/19/18 8889 12/10/18 1248 01/07/19 1017 02/03/19 1558       Core Components/Risk Factors/Patient Goals Review   Personal Goals Review  Weight Management/Obesity Improve heart; get good new about heart.   Weight Management/Obesity;Tobacco Cessation Improve heart; get back to work; increase strength and stamina.   Weight Management/Obesity;Tobacco Cessation Improve heart; get good news about heart.   Weight Management/Obesity;Tobacco Cessation Improve heart; get good news about heart.     Review  Patient is new to the program completeing 6 sessions gaining 2 lbs since his initial visit. He has been progressed and is doing well so far. Will continue to monitor for progress.   Patient has completed 13 sessions gaining 2 lbs since last 30 day review. He is doing well in the program with progression. He says he is doing this because this is what the doctor wants him to do. He continues to smoke but is down to 6 cigarettes/day. Will continue to monitor for progress.   Patient has completed 25 sessions gaining 1 lbs since last 30 day review. He continues to do well in the program with progression. He states he is learing more about the body and his condition and he likes it. He is smoking 6 cigarretts/day. He states he does not smoke in his house or around his wife and children. Will continue to monitor for progress.   Patient graduated with 36 sessions gaining 4.3 lbs overall; however, his waist and hip measurements both decreased by 1 inch and 0.5 inches. He did well in the program. His exit measurments increased in grip strength. His walk test was the same. He states he learned a lot from the education sessions and he is breathing better and is working more in his garden  and is able to work on [employer] gardens a well. He feels better overall. He continues to smoke 5 to 6 cigarettes/day and hopes to quit totally. He feels the program was a success and that he meet his goals. He plans to continue exercising by staying active working in his gardens and by walking. CR will f/u for 1 year.     Expected Outcomes  Patient will continue to attend sessions and complete the program meeting his personal goals.   Patient will continue to attend sessions and complete the program meeting his personal goals.   Patient will continue to attend sessions and complete the program meeting his personal goals.   Patient will continue exercising by  walking and working in his gardens and continue to meet his goals.        Core Components/Risk Factors/Patient Goals at Discharge (Final Review):  Goals and Risk Factor Review - 02/03/19 1558      Core Components/Risk Factors/Patient Goals Review   Personal Goals Review  Weight Management/Obesity;Tobacco Cessation   Improve heart; get good news about heart.    Review  Patient graduated with 36 sessions gaining 4.3 lbs overall; however, his waist and hip measurements both decreased by 1 inch and 0.5 inches. He did well in the program. His exit measurments increased in grip strength. His walk test was the same. He states he learned a lot from the education sessions and he is breathing better and is working more in his garden and is able to work on [employer] gardens a well. He feels better overall. He continues to smoke 5 to 6 cigarettes/day and hopes to quit totally. He feels the program was a success and that he meet his goals. He plans to continue exercising by staying active working in his gardens and by walking. CR will f/u for 1 year.     Expected Outcomes  Patient will continue exercising by walking and working in his gardens and continue to meet his goals.        ITP Comments:   Comments: Patient graduated from [facility] today on 02/01/2019 after completing 36 sessions. He achieved LTG of 30 minutes of aerobic exercise at Max Met level of 3.14. All patients vitals are WNL. Patient has met with dietician. Discharge instruction has been reviewed in detail and patient stated an understanding of material given. Patient plans to continue exercising at home by walking and staying active in his gardens and yard. Cardiac Rehab staff will make f/u calls at 1 month, 6 months, and 1 year. Patient had no complaints of any abnormal S/S or pain on their exit visit.

## 2019-03-11 ENCOUNTER — Telehealth: Payer: Self-pay | Admitting: *Deleted

## 2019-03-11 NOTE — Telephone Encounter (Signed)
Spoke with marvel (on dpr) and patient procedure is r/s'd to 05/11/2019 at 8:15am. Terence Lux is aware will mail new instructions. Nothing further needed

## 2019-03-18 ENCOUNTER — Telehealth: Payer: Self-pay | Admitting: Cardiology

## 2019-03-18 NOTE — Telephone Encounter (Signed)
Patient had been putting alcohol on his chest wherehe has incision line .I discouraged that as it dries skin out.I suggested he use a mild soap like dove or ivory and use lotion after bath.He said he would try that.

## 2019-03-18 NOTE — Telephone Encounter (Signed)
Patient called stating that he continues to itch around the area of his heart surgery.

## 2019-05-03 ENCOUNTER — Telehealth: Payer: Self-pay | Admitting: *Deleted

## 2019-05-03 ENCOUNTER — Other Ambulatory Visit (HOSPITAL_COMMUNITY): Payer: Self-pay | Admitting: Neurology

## 2019-05-03 ENCOUNTER — Other Ambulatory Visit: Payer: Self-pay | Admitting: Neurology

## 2019-05-03 DIAGNOSIS — F0390 Unspecified dementia without behavioral disturbance: Secondary | ICD-10-CM

## 2019-05-03 NOTE — Telephone Encounter (Signed)
Lmom for pt to call us back.  Need to schedule COVID 19 screening prior to procedure. 

## 2019-05-04 ENCOUNTER — Telehealth: Payer: Self-pay | Admitting: *Deleted

## 2019-05-04 NOTE — Telephone Encounter (Signed)
Called and spoke to pt's wife, TCS w/RMR rescheduled to 07/07/19 at 8:30am. Endo scheduler informed. New instructions mailed.

## 2019-05-04 NOTE — Telephone Encounter (Signed)
Called pt to schedule COVID 19 screening.  Wife requested [place] to re-schedule his procedure.  She says that they don't feel comfortable having the procedure right now due to COVID 19.  They would like [place] to call them back to re-schedule it for sometime in August.  [phone].

## 2019-05-07 ENCOUNTER — Other Ambulatory Visit: Payer: Self-pay

## 2019-05-07 ENCOUNTER — Ambulatory Visit (HOSPITAL_COMMUNITY)
Admission: RE | Admit: 2019-05-07 | Discharge: 2019-05-07 | Disposition: A | Discharge status: Home / Self Care | Payer: Medicare HMO | Source: Ambulatory Visit | Attending: Neurology | Admitting: Neurology

## 2019-05-07 DIAGNOSIS — F0390 Unspecified dementia without behavioral disturbance: Secondary | ICD-10-CM | POA: Insufficient documentation

## 2019-06-03 ENCOUNTER — Telehealth: Payer: Self-pay | Admitting: *Deleted

## 2019-06-03 NOTE — Telephone Encounter (Signed)
Called pt but was advised to speak with his spouse. She is aware patient needs COVID-19 testing prior to procedure and patient will need to remain quarantined after testing until procedure. She voiced understanding. appt scheduled for 8/3 at 9:00am. Aware of testing location.

## 2019-06-08 ENCOUNTER — Ambulatory Visit (INDEPENDENT_AMBULATORY_CARE_PROVIDER_SITE_OTHER): Payer: Medicare HMO | Admitting: Cardiology

## 2019-06-08 ENCOUNTER — Encounter: Payer: Self-pay | Admitting: Cardiology

## 2019-06-08 ENCOUNTER — Other Ambulatory Visit: Payer: Self-pay

## 2019-06-08 VITALS — BP 116/73 | HR 84 | Ht 74.0 in | Wt 206.0 lb

## 2019-06-08 DIAGNOSIS — I2510 Atherosclerotic heart disease of native coronary artery without angina pectoris: Secondary | ICD-10-CM

## 2019-06-08 DIAGNOSIS — I10 Essential (primary) hypertension: Secondary | ICD-10-CM | POA: Diagnosis not present

## 2019-06-08 DIAGNOSIS — E785 Hyperlipidemia, unspecified: Secondary | ICD-10-CM

## 2019-06-08 DIAGNOSIS — I6523 Occlusion and stenosis of bilateral carotid arteries: Secondary | ICD-10-CM | POA: Diagnosis not present

## 2019-06-08 MED ORDER — LISINOPRIL 2.5 MG PO TABS: 2.5000 mg | ORAL_TABLET | Freq: Every day | ORAL | 3 refills | Status: DC

## 2019-06-08 NOTE — Patient Instructions (Signed)
Medication Instructions:  START LISINOPRIL 2.5 MG DAILY   STOP PLAVIX ON August 03, 2019  Labwork: 2 WEEKS  BMET   Testing/Procedures: Your physician has requested that you have a carotid duplex. This test is an ultrasound of the carotid arteries in your neck. It looks at blood flow through these arteries that supply the brain with blood. Allow one hour for this exam. There are no restrictions or special instructions.    Follow-Up: Your physician wants you to follow-up in: 6 MONTHS . You will receive a reminder letter in the mail two months in advance. If you don't receive a letter, please call our office to schedule the follow-up appointment.   Any Other Special Instructions Will Be Listed Below (If Applicable).     If you need a refill on your cardiac medications before your next appointment, please call your pharmacy.

## 2019-06-08 NOTE — Progress Notes (Signed)
Clinical Summary Mr. Lefferts is a 73 y.o.male last seen by PA Strader, this is our first visit together  1. CAD - history of CABG 08/2018 LIMA-distal LAD and seq-SVG-medial and lateral [place]li Min of [place] - 08/2018 echo LVEF 55-60%, no WMAs, grade II diastolic dysfunction - has been on DAPT after CABG as he presented with NSTEMI  - no recent chest pain. No SOB or DOE - compliant with meds.    2. Carotid stenosis - 08/2018 carotid US: RICA >45%, LICA <62% - no recent symptoms  3. HTN - compliant with meds   4. Hyperlipidemia - recent labs by pcp - compliant with statin    Past Medical History:  Diagnosis Date  . Coronary artery disease   . Gout   . High cholesterol   . Hypertension   . NSTEMI (non-ST elevated myocardial infarction) ([place]) 07/29/2018  . 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 Tobacco abuse      No Known Allergies   Current Outpatient Medications  Medication Sig Dispense Refill  . allopurinol (ZYLOPRIM) 100 MG tablet Take 100 mg by mouth daily.    Marland Kitchen aspirin EC 81 MG EC tablet Take 1 tablet (81 mg total) by mouth daily.    Marland Kitchen atorvastatin (LIPITOR) 80 MG tablet Take 1 tablet (80 mg total) by mouth daily at 6 PM. 30 tablet 1  . cetirizine (ZYRTEC) 10 MG tablet Take 10 mg by mouth daily as needed for allergies.    Marland Kitchen clopidogrel (PLAVIX) 75 MG tablet Take 1 tablet (75 mg total) by mouth daily. 30 tablet 1  . ferrous sulfate 325 (65 FE) MG EC tablet Take 1 tablet (325 mg total) by mouth 2 (two) times daily. (Patient not taking: Reported on 03/04/2019) 60 tablet 3  . hydroxypropyl methylcellulose / hypromellose (ISOPTO TEARS / GONIOVISC) 2.5 % ophthalmic solution Place 1 drop into both eyes 3 (three) times daily as needed for dry eyes.    . metoprolol tartrate (LOPRESSOR) 25 MG tablet Take 1 tablet (25 mg total) by mouth 2 (two) times daily. 60 tablet 3   . oxymetazoline (AFRIN) 0.05 % nasal spray Place 1 spray into both nostrils 2 (two) times daily as needed for congestion.    . polyethylene glycol-electrolytes (TRILYTE) 420 g solution Take 4,000 mLs by mouth as directed. 4000 mL 0   No current facility-administered medications for this visit.      Past Surgical History:  Procedure Laterality Date  . BACK SURGERY    . COLONOSCOPY  2004   Dr. Gala Romney: Normal.  For history of tubulovillous adenoma back in 2001, five-year follow-up recommended.  . COLONOSCOPY  2009   Dr. Emerson Monte: 6 mm polyp in the ascending segment and one in the sigmoid colon removed.  Pathology showed tubular adenomas.  5-year surveillance colonoscopy recommended.  . CORONARY ARTERY BYPASS GRAFT N/A 08/05/2018   Procedure: CORONARY ARTERY BYPASS GRAFTING (CABG) x3. Endoscopic Saphenous vein harvest.;  Surgeon: Rexene Alberts, MD;  Location: [facility];  Service: Open Heart Surgery;  Laterality: N/A;  . INTRAVASCULAR PRESSURE WIRE/FFR STUDY N/A 08/04/2018   Procedure: INTRAVASCULAR PRESSURE WIRE/FFR STUDY;  Surgeon: Burnell Blanks, MD;  Location: [facility];  Service: Cardiovascular;  Laterality: N/A;  . LEFT HEART CATH AND CORONARY ANGIOGRAPHY N/A 07/30/2018   Procedure: LEFT HEART CATH AND CORONARY ANGIOGRAPHY;  Surgeon: Larae Grooms  S, MD;  Location: [facility];  Service: Cardiovascular;  Laterality: N/A;  . TEE WITHOUT CARDIOVERSION N/A 08/05/2018   Procedure: TRANSESOPHAGEAL ECHOCARDIOGRAM (TEE);  Surgeon: Rexene Alberts, MD;  Location: [facility];  Service: Open Heart Surgery;  Laterality: N/A;     No Known Allergies    Family History  Problem Relation Age of Onset  . Hypertension Father      Social History Mr. Moster reports that he has been smoking cigarettes. He has a 10.00 pack-year smoking history. He has never used smokeless tobacco. Mr. Turton reports no history of alcohol use.   Review of Systems CONSTITUTIONAL: No weight  loss, fever, chills, weakness or fatigue.  HEENT: Eyes: No visual loss, blurred vision, double vision or yellow sclerae.No hearing loss, sneezing, congestion, runny nose or sore throat.  SKIN: No rash or itching.  CARDIOVASCULAR: per hpi RESPIRATORY: No shortness of breath, cough or sputum.  GASTROINTESTINAL: No anorexia, nausea, vomiting or diarrhea. No abdominal pain or blood.  GENITOURINARY: No burning on urination, no polyuria NEUROLOGICAL: No headache, dizziness, syncope, paralysis, ataxia, numbness or tingling in the extremities. No change in bowel or bladder control.  MUSCULOSKELETAL: No muscle, back pain, joint pain or stiffness.  LYMPHATICS: No enlarged nodes. No history of splenectomy.  PSYCHIATRIC: No history of depression or anxiety.  ENDOCRINOLOGIC: No reports of sweating, cold or heat intolerance. No polyuria or polydipsia.  Marland Kitchen   Physical Examination Today's Vitals   06/08/19 1249  BP: 116/73  Pulse: 84  SpO2: 94%  Weight: 206 lb (93.4 kg)  Height: 6\' 2"  (1.88 m)   Body mass index is 26.45 kg/m.  Gen: resting comfortably, no acute distress HEENT: no scleral icterus, pupils equal round and reactive, no palptable cervical adenopathy,  CV: RRR, 2/6 systolic murmur rusb, right sided carotid bruit Resp: Clear to auscultation bilaterally GI: abdomen is soft, non-tender, non-distended, normal bowel sounds, no hepatosplenomegaly MSK: extremities are warm, no edema.  Skin: warm, no rash Neuro:  no focal deficits Psych: appropriate affect   Diagnostic Studies Cardiac Catheterization: 08-Aug-2018  Mid LM to Dist LM lesion is 50% stenosed. 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  Ost Ramus lesion is 95% stenosed.  Lat Ramus lesion is 99% stenosed.  Dist RCA lesion is 100% stenosed.  Mid Cx lesion is 70% stenosed.  Prox LAD lesion is 60% stenosed.  The left ventricular systolic function is normal.  LV end diastolic pressure is normal. LVEDP 6 mm  Hg.  The left ventricular ejection fraction is 55-65% by visual estimate.  There is no aortic valve stenosis.  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    Limited Echocardiogram: 08/10/2018 Study Conclusions  - Left ventricle: The cavity size was normal. Systolic function was normal. The estimated ejection fraction was in the range of 55% to 60%. Wall motion was normal; there were no regional wall motion abnormalities. Features are consistent with a pseudonormal left ventricular filling pattern, with concomitant abnormal relaxation and increased filling pressure (grade 2 diastolic dysfunction). - Aortic valve: Valve mobility was restricted. Transvalvular velocity was within the normal range. There was no stenosis. There was no regurgitation. - Mitral valve: Transvalvular velocity was within the normal range. There was no evidence for stenosis. There was mild regurgitation. - Right  ventricle: The cavity size was normal. Wall thickness was normal. Systolic function was normal. - Atrial septum: No defect or patent foramen ovale was identified by color flow Doppler. - Tricuspid valve: There was mild regurgitation.  Carotid Artery Stenosis: 08/2018 IMPRESSION: Right greater than left carotid atherosclerosis.  Right ICA stenosis estimated greater than 70% by ultrasound criteria  Left ICA narrowing less than 50%.    Assessment and Plan  1. CAD - doing well nearly 1 year out from CABG, no recent symptoms - will stop plavix 08/03/2019, having completed one year of therapy after CABG in setting of ACS - start lisinopril 2.5 mg daily in setting of CAD, prior NSTEMI  2. HTN - at goal, starting low dose lisinopril more for CV benefits as  opposed to bp control. Check BMET in 2 weeks  3. Carotid US - asymptomatic, repeat US 08/2019  4. Hyperlipidemia - request labs from pcp, continue high dose statin   F/u 6 months      Arnoldo Lenis, M.D.

## 2019-07-05 ENCOUNTER — Other Ambulatory Visit (HOSPITAL_COMMUNITY)
Admission: RE | Admit: 2019-07-05 | Discharge: 2019-07-05 | Disposition: A | Discharge status: Home / Self Care | Payer: Medicare HMO | Source: Ambulatory Visit | Attending: Internal Medicine | Admitting: Internal Medicine

## 2019-07-05 DIAGNOSIS — Z01812 Encounter for preprocedural laboratory examination: Secondary | ICD-10-CM | POA: Insufficient documentation

## 2019-07-05 DIAGNOSIS — Z20828 Contact with and (suspected) exposure to other viral communicable diseases: Secondary | ICD-10-CM | POA: Diagnosis not present

## 2019-07-05 LAB — SARS CORONAVIRUS 2 (TAT 6-24 HRS): SARS Coronavirus 2: NEGATIVE

## 2019-07-07 ENCOUNTER — Encounter (HOSPITAL_COMMUNITY): Payer: Self-pay | Admitting: *Deleted

## 2019-07-07 ENCOUNTER — Encounter (HOSPITAL_COMMUNITY)
Admission: RE | Disposition: A | Discharge status: Home / Self Care | Payer: Self-pay | Source: Home / Self Care | Attending: Internal Medicine

## 2019-07-07 ENCOUNTER — Other Ambulatory Visit: Payer: Self-pay

## 2019-07-07 ENCOUNTER — Ambulatory Visit (HOSPITAL_COMMUNITY)
Admission: RE | Admit: 2019-07-07 | Discharge: 2019-07-07 | Disposition: A | Discharge status: Home / Self Care | Payer: Medicare HMO | Attending: Internal Medicine | Admitting: Internal Medicine

## 2019-07-07 DIAGNOSIS — M109 Gout, unspecified: Secondary | ICD-10-CM | POA: Insufficient documentation

## 2019-07-07 DIAGNOSIS — Z951 Presence of aortocoronary bypass graft: Secondary | ICD-10-CM | POA: Insufficient documentation

## 2019-07-07 DIAGNOSIS — Z8719 Personal history of other diseases of the digestive system: Secondary | ICD-10-CM | POA: Insufficient documentation

## 2019-07-07 DIAGNOSIS — K635 Polyp of colon: Secondary | ICD-10-CM

## 2019-07-07 DIAGNOSIS — D649 Anemia, unspecified: Secondary | ICD-10-CM

## 2019-07-07 DIAGNOSIS — E7800 Pure hypercholesterolemia, unspecified: Secondary | ICD-10-CM | POA: Diagnosis not present

## 2019-07-07 DIAGNOSIS — F17210 Nicotine dependence, cigarettes, uncomplicated: Secondary | ICD-10-CM | POA: Insufficient documentation

## 2019-07-07 DIAGNOSIS — Z79899 Other long term (current) drug therapy: Secondary | ICD-10-CM | POA: Insufficient documentation

## 2019-07-07 DIAGNOSIS — Z7902 Long term (current) use of antithrombotics/antiplatelets: Secondary | ICD-10-CM | POA: Insufficient documentation

## 2019-07-07 DIAGNOSIS — I252 Old myocardial infarction: Secondary | ICD-10-CM | POA: Insufficient documentation

## 2019-07-07 DIAGNOSIS — Z7982 Long term (current) use of aspirin: Secondary | ICD-10-CM | POA: Diagnosis not present

## 2019-07-07 DIAGNOSIS — Z8249 Family history of ischemic heart disease and other diseases of the circulatory system: Secondary | ICD-10-CM | POA: Insufficient documentation

## 2019-07-07 DIAGNOSIS — Z86010 Personal history of colonic polyps: Secondary | ICD-10-CM | POA: Insufficient documentation

## 2019-07-07 DIAGNOSIS — I2510 Atherosclerotic heart disease of native coronary artery without angina pectoris: Secondary | ICD-10-CM | POA: Insufficient documentation

## 2019-07-07 DIAGNOSIS — I10 Essential (primary) hypertension: Secondary | ICD-10-CM | POA: Diagnosis not present

## 2019-07-07 DIAGNOSIS — D125 Benign neoplasm of sigmoid colon: Secondary | ICD-10-CM | POA: Insufficient documentation

## 2019-07-07 DIAGNOSIS — Z1211 Encounter for screening for malignant neoplasm of colon: Secondary | ICD-10-CM | POA: Insufficient documentation

## 2019-07-07 HISTORY — PX: POLYPECTOMY: SHX5525

## 2019-07-07 HISTORY — PX: COLONOSCOPY: SHX5424

## 2019-07-07 SURGERY — COLONOSCOPY
Anesthesia: Moderate Sedation

## 2019-07-07 MED ORDER — ONDANSETRON HCL 4 MG/2ML IJ SOLN
INTRAMUSCULAR | Status: AC
  Filled 2019-07-07: qty 2

## 2019-07-07 MED ORDER — MEPERIDINE HCL 100 MG/ML IJ SOLN
INTRAMUSCULAR | Status: DC | PRN
  Administered 2019-07-07: 25 mg via INTRAVENOUS
  Administered 2019-07-07: 15 mg via INTRAVENOUS

## 2019-07-07 MED ORDER — STERILE WATER FOR IRRIGATION IR SOLN
Status: DC | PRN
  Administered 2019-07-07: 09:00:00 1.5 mL

## 2019-07-07 MED ORDER — SODIUM CHLORIDE 0.9 % IV SOLN
INTRAVENOUS | Status: DC
  Administered 2019-07-07: 08:00:00 via INTRAVENOUS

## 2019-07-07 MED ORDER — ONDANSETRON HCL 4 MG/2ML IJ SOLN
INTRAMUSCULAR | Status: DC | PRN
  Administered 2019-07-07: 4 mg via INTRAVENOUS

## 2019-07-07 MED ORDER — MIDAZOLAM HCL 5 MG/5ML IJ SOLN
INTRAMUSCULAR | Status: AC
  Filled 2019-07-07: qty 10

## 2019-07-07 MED ORDER — MIDAZOLAM HCL 5 MG/5ML IJ SOLN
INTRAMUSCULAR | Status: DC | PRN
  Administered 2019-07-07: 2 mg via INTRAVENOUS
  Administered 2019-07-07 (×2): 1 mg via INTRAVENOUS

## 2019-07-07 MED ORDER — MEPERIDINE HCL 50 MG/ML IJ SOLN
INTRAMUSCULAR | Status: AC
  Filled 2019-07-07: qty 1

## 2019-07-07 NOTE — Op Note (Signed)
[facility] Patient Name: Timothy Simpson Procedure Date: 07/07/2019 8:11 AM MRN: [id number] Date of Birth: 1946-08-22 Attending MD: Norvel Richards , MD CSN: [id number] Age: 73 Admit Type: Outpatient Procedure:                Colonoscopy Indications:              High risk colon cancer surveillance: Personal                            history of colonic polyps Providers:                Norvel Richards, MD, Charlsie Quest. Theda Sers RN, RN,                            Raphael Gibney, Technician Referring MD:              Medicines:                Midazolam 4 mg IV, Meperidine 40 mg IV Complications:            No immediate complications. Estimated Blood Loss:     Estimated blood loss was minimal. Procedure:                Pre-Anesthesia Assessment:                           - Prior to the procedure, a History and Physical                            was performed, and patient medications and                            allergies were reviewed. The patient's tolerance of                            previous anesthesia was also reviewed. The risks                            and benefits of the procedure and the sedation                            options and risks were discussed with the patient.                            All questions were answered, and informed consent                            was obtained. Prior Anticoagulants: The patient has                            taken no previous anticoagulant or antiplatelet                            agents. ASA Grade Assessment: II - A patient with  mild systemic disease. After reviewing the risks                            and benefits, the patient was deemed in                            satisfactory condition to undergo the procedure.                           After obtaining informed consent, the colonoscope                            was passed under direct vision. Throughout the                             procedure, the patient's blood pressure, pulse, and                            oxygen saturations were monitored continuously. The                            [id number]-[id number] ([id number]) scope was introduced through                            the anus and advanced to the the cecum, identified                            by appendiceal orifice and ileocecal valve. The                            colonoscopy was performed without difficulty. The                            patient tolerated the procedure well. The quality                            of the bowel preparation was adequate. The                            ileocecal valve, appendiceal orifice, and rectum                            were photographed. The entire colon was well                            visualized. Scope In: 8:37:39 AM Scope Out: 8:52:02 AM Scope Withdrawal Time: 0 hours 10 minutes 18 seconds  Total Procedure Duration: 0 hours 14 minutes 23 seconds  Findings:      The perianal and digital rectal examinations were normal.      A 10 mm polyp was found in the appendiceal orifice. The polyp was       semi-pedunculated. The polyp was removed with a hot snare. Resection and       retrieval were complete. Estimated blood loss: none.  A 6 mm polyp was found in the sigmoid colon. The polyp was sessile. The       polyp was removed with a cold snare. Resection and retrieval were       complete. Estimated blood loss was minimal.      The exam was otherwise without abnormality on direct and retroflexion       views. Impression:               - One 10 mm polyp at the appendiceal orifice,                            removed with a hot snare. Resected and retrieved.                           - One 6 mm polyp in the sigmoid colon, removed with                            a cold snare. Resected and retrieved.                           - The examination was otherwise normal on direct                            and retroflexion  views. Moderate Sedation:      Moderate (conscious) sedation was administered by the endoscopy nurse       and supervised by the endoscopist. The following parameters were       monitored: oxygen saturation, heart rate, blood pressure, respiratory       rate, EKG, adequacy of pulmonary ventilation, and response to care.       Total physician intraservice time was 20 minutes. Recommendation:           - Patient has a contact number available for                            emergencies. The signs and symptoms of potential                            delayed complications were discussed with the                            patient. Return to normal activities tomorrow.                            Written discharge instructions were provided to the                            patient.                           - Resume previous diet.                           - Continue present medications.                           -  Repeat colonoscopy date to be determined after                            pending pathology results are reviewed for                            surveillance based on pathology results.                           - Return to GI office (date not yet determined). Procedure Code(s):        --- Professional ---                           [phone], Colonoscopy, flexible; with removal of                            tumor(s), polyp(s), or other lesion(s) by snare                            technique                           G0500, Moderate sedation services provided by the                            same physician or other qualified health care                            professional performing a gastrointestinal                            endoscopic service that sedation supports,                            requiring the presence of an independent trained                            observer to assist in the monitoring of the                            patient's level of consciousness and  physiological                            status; initial 15 minutes of intra-service time;                            patient age 42 years or older (additional time may                            be reported with [phone], as appropriate) Diagnosis Code(s):        --- Professional ---                           Z86.010, Personal history of colonic polyps  K63.5, Polyp of colon CPT copyright 2019 [employer]. All rights reserved. The codes documented in this report are preliminary and upon coder review may  be revised to meet current compliance requirements. Cristopher Estimable. Sharece Fleischhacker, MD Norvel Richards, MD 07/07/2019 8:59:19 AM This report has been signed electronically. Number of Addenda: 0

## 2019-07-07 NOTE — H&P (Signed)
@LOGO @   Primary Care Physician:  Jani Gravel, MD Primary Gastroenterologist:  Dr. Gala Romney  Pre-Procedure History & Physical: HPI:  Timothy Simpson is a 73 y.o. male here for surveillance colonoscopy.  History of adenoma-advance removed-distant past.  Overdue for surveillance.  No bowel symptoms.  Past Medical History:  Diagnosis Date  . Coronary artery disease   . Gout   . High cholesterol   . Hypertension   . NSTEMI (non-ST elevated myocardial infarction) ([facility]) 07/29/2018  . 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 Tobacco abuse     Past Surgical History:  Procedure Laterality Date  . BACK SURGERY    . COLONOSCOPY  2004   Dr. Gala Romney: Normal.  For history of tubulovillous adenoma back in 2001, five-year follow-up recommended.  . COLONOSCOPY  2009   Dr. Emerson Monte: 6 mm polyp in the ascending segment and one in the sigmoid colon removed.  Pathology showed tubular adenomas.  5-year surveillance colonoscopy recommended.  . CORONARY ARTERY BYPASS GRAFT N/A 08/05/2018   Procedure: CORONARY ARTERY BYPASS GRAFTING (CABG) x3. Endoscopic Saphenous vein harvest.;  Surgeon: Rexene Alberts, MD;  Location: [facility];  Service: Open Heart Surgery;  Laterality: N/A;  . INTRAVASCULAR PRESSURE WIRE/FFR STUDY N/A 08/04/2018   Procedure: INTRAVASCULAR PRESSURE WIRE/FFR STUDY;  Surgeon: Burnell Blanks, MD;  Location: [facility];  Service: Cardiovascular;  Laterality: N/A;  . LEFT HEART CATH AND CORONARY ANGIOGRAPHY N/A 07/30/2018   Procedure: LEFT HEART CATH AND CORONARY ANGIOGRAPHY;  Surgeon: Jettie Booze, MD;  Location: [facility];  Service: Cardiovascular;  Laterality: N/A;  . TEE WITHOUT CARDIOVERSION N/A 08/05/2018   Procedure: TRANSESOPHAGEAL ECHOCARDIOGRAM (TEE);  Surgeon: Rexene Alberts, MD;  Location: [facility];  Service: Open Heart Surgery;  Laterality: N/A;     Prior to Admission medications   Medication Sig Start Date End Date Taking? Authorizing Provider  allopurinol (ZYLOPRIM) 100 MG tablet Take 100 mg by mouth daily. 04/28/18  Yes [provider]  aspirin EC 81 MG EC tablet Take 1 tablet (81 mg total) by mouth daily. 08/09/18  Yes Lars Pinks M, PA-C  atorvastatin (LIPITOR) 80 MG tablet Take 1 tablet (80 mg total) by mouth daily at 6 PM. 08/09/18  Yes Lars Pinks M, PA-C  clopidogrel (PLAVIX) 75 MG tablet Take 1 tablet (75 mg total) by mouth daily. 08/09/18  Yes Lars Pinks M, PA-C  lisinopril (ZESTRIL) 2.5 MG tablet Take 1 tablet (2.5 mg total) by mouth daily. 06/08/19 09/06/19 Yes Branch, Alphonse Guild, MD  metoprolol tartrate (LOPRESSOR) 25 MG tablet Take 1 tablet (25 mg total) by mouth 2 (two) times daily. 08/11/18  Yes Barrett, Erin R, PA-C  polyethylene glycol-electrolytes (TRILYTE) 420 g solution Take 4,000 mLs by mouth as directed. 01/18/19  Yes Rourk, Cristopher Estimable, MD  cetirizine (ZYRTEC) 10 MG tablet Take 10 mg by mouth daily as needed for allergies.    [provider]  ferrous sulfate 325 (65 FE) MG EC tablet Take 1 tablet (325 mg total) by mouth 2 (two) times daily. 08/11/18 08/11/19  Barrett, Erin R, PA-C  hydroxypropyl methylcellulose / hypromellose (ISOPTO TEARS / GONIOVISC) 2.5 % ophthalmic solution Place 1 drop into both eyes 3 (three) times daily as needed for dry eyes.    [provider]  oxymetazoline (AFRIN) 0.05 % nasal spray Place 1 spray into both nostrils  2 (two) times daily as needed for congestion.    [provider]    Allergies as of 01/18/2019  . (No Known Allergies)    Family History  Problem Relation Age of Onset  . Hypertension Father     Social History   Socioeconomic History  . Marital status: Married    Spouse name: Not on file  . Number of children: Not on file  . Years of education: Not on file  . Highest education level: Not on file  Occupational History   . Not on file  Social Needs  . Financial resource strain: Not on file  . Food insecurity    Worry: Not on file    Inability: Not on file  . Transportation needs    Medical: Not on file    Non-medical: Not on file  Tobacco Use  . Smoking status: Current Every Day Smoker    Packs/day: 0.25    Years: 40.00    Pack years: 10.00    Types: Cigarettes  . Smokeless tobacco: Never Used  . Tobacco comment: he sated that he would quit if he was told he had to.   Substance and Sexual Activity  . Alcohol use: No  . Drug use: No  . Sexual activity: Not on file  Lifestyle  . Physical activity    Days per week: Not on file    Minutes per session: Not on file  . Stress: Not on file  Relationships  . Social Herbalist on phone: Not on file    Gets together: Not on file    Attends religious service: Not on file    Active member of club or organization: Not on file    Attends meetings of clubs or organizations: Not on file    Relationship status: Not on file  . Intimate partner violence    Fear of current or ex partner: Not on file    Emotionally abused: Not on file    Physically abused: Not on file    Forced sexual activity: Not on file  Other Topics Concern  . Not on file  Social History Narrative  . Not on file    Review of Systems: See HPI, otherwise negative ROS  Physical Exam: BP (!) 160/87   Pulse 62   Temp 97.7 F (36.5 C) (Oral)   Resp 14   Ht 6\' 2"  (1.88 m)   Wt 94.8 kg   SpO2 97%   BMI 26.83 kg/m  General:   Alert,  Well-developed, well-nourished, pleasant and cooperative in NAD Mouth:  No deformity or lesions. Neck:  Supple; no masses or thyromegaly. No significant cervical adenopathy. Lungs:  Clear throughout to auscultation.   No wheezes, crackles, or rhonchi. No acute distress. Heart:  Regular rate and rhythm; no murmurs, clicks, rubs,  or gallops. Abdomen: Non-distended, normal bowel sounds.  Soft and nontender without appreciable mass or  hepatosplenomegaly.  Pulses:  Normal pulses noted. Extremities:  Without clubbing or edema.  Impression/Plan: Pleasant 73 year old gentleman with a history of multiple colonic polyps (advanced adenoma) removed in the past.  Overdue for surveillance examination  I have offered the patient a surveillance colonoscopy today.     Notice: This dictation was prepared with Dragon dictation along with smaller phrase technology. Any transcriptional errors that result from this process are unintentional and may not be corrected upon review.

## 2019-07-07 NOTE — Discharge Instructions (Signed)
Colonoscopy Discharge Instructions  Read the instructions outlined below and refer to this sheet in the next few weeks. These discharge instructions provide you with general information on caring for yourself after you leave the hospital. Your doctor may also give you specific instructions. While your treatment has been planned according to the most current medical practices available, unavoidable complications occasionally occur. If you have any problems or questions after discharge, call Dr. Gala Romney at [phone]. ACTIVITY  You may resume your regular activity, but move at a slower pace for the next 24 hours.   Take frequent rest periods for the next 24 hours.   Walking will help get rid of the air and reduce the bloated feeling in your belly (abdomen).   No driving for 24 hours (because of the medicine (anesthesia) used during the test).    Do not sign any important legal documents or operate any machinery for 24 hours (because of the anesthesia used during the test).  NUTRITION  Drink plenty of fluids.   You may resume your normal diet as instructed by your doctor.   Begin with a light meal and progress to your normal diet. Heavy or fried foods are harder to digest and may make you feel sick to your stomach (nauseated).   Avoid alcoholic beverages for 24 hours or as instructed.  MEDICATIONS  You may resume your normal medications unless your doctor tells you otherwise.  WHAT YOU CAN EXPECT TODAY  Some feelings of bloating in the abdomen.   Passage of more gas than usual.   Spotting of blood in your stool or on the toilet paper.  IF YOU HAD POLYPS REMOVED DURING THE COLONOSCOPY:  No aspirin products for 7 days or as instructed.   No alcohol for 7 days or as instructed.   Eat a soft diet for the next 24 hours.  FINDING OUT THE RESULTS OF YOUR TEST Not all test results are available during your visit. If your test results are not back during the visit, make an appointment  with your caregiver to find out the results. Do not assume everything is normal if you have not heard from your caregiver or the medical facility. It is important for you to follow up on all of your test results.  SEEK IMMEDIATE MEDICAL ATTENTION IF:  You have more than a spotting of blood in your stool.   Your belly is swollen (abdominal distention).   You are nauseated or vomiting.   You have a temperature over 101.   You have abdominal pain or discomfort that is severe or gets worse throughout the day.   Colon polyp information provided  Further recommendations to follow pending review of pathology report  I have discussed my findings and recommendations with wife, Amalia Hailey,  At [phone]   Colon Polyps  Polyps are tissue growths inside the body. Polyps can grow in many places, including the large intestine (colon). A polyp may be a round bump or a mushroom-shaped growth. You could have one polyp or several. Most colon polyps are noncancerous (benign). However, some colon polyps can become cancerous over time. Finding and removing the polyps early can help prevent this. What are the causes? The exact cause of colon polyps is not known. What increases the risk? You are more likely to develop this condition if you:  Have a family history of colon cancer or colon polyps.  Are older than 54 or older than 45 if you are African American.  Have inflammatory bowel disease,  such as ulcerative colitis or Crohn's disease.  Have certain hereditary conditions, such as: ? Familial adenomatous polyposis. ? Lynch syndrome. ? Turcot syndrome. ? Peutz-Jeghers syndrome.  Are overweight.  Smoke cigarettes.  Do not get enough exercise.  Drink too much alcohol.  Eat a diet that is high in fat and red meat and low in fiber.  Had childhood cancer that was treated with abdominal radiation. What are the signs or symptoms? Most polyps do not cause symptoms. If you have symptoms, they may  include:  Blood coming from your rectum when having a bowel movement.  Blood in your stool. The stool may look dark red or black.  Abdominal pain.  A change in bowel habits, such as constipation or diarrhea. How is this diagnosed? This condition is diagnosed with a colonoscopy. This is a procedure in which a lighted, flexible scope is inserted into the anus and then passed into the colon to examine the area. Polyps are sometimes found when a colonoscopy is done as part of routine cancer screening tests. How is this treated? Treatment for this condition involves removing any polyps that are found. Most polyps can be removed during a colonoscopy. Those polyps will then be tested for cancer. Additional treatment may be needed depending on the results of testing. Follow these instructions at home: Lifestyle  Maintain a healthy weight, or lose weight if recommended by your health care provider.  Exercise every day or as told by your health care provider.  Do not use any products that contain nicotine or tobacco, such as cigarettes and e-cigarettes. If you need help quitting, ask your health care provider.  If you drink alcohol, limit how much you have: ? 0-1 drink a day for women. ? 0-2 drinks a day for men.  Be aware of how much alcohol is in your drink. In the [place] one drink equals one 12 oz bottle of beer (355 mL), one 5 oz glass of wine (148 mL), or one 1 oz shot of hard liquor (44 mL). Eating and drinking   Eat foods that are high in fiber, such as fruits, vegetables, and whole grains.  Eat foods that are high in calcium and vitamin D, such as milk, cheese, yogurt, eggs, liver, fish, and broccoli.  Limit foods that are high in fat, such as fried foods and desserts.  Limit the amount of red meat and processed meat you eat, such as hot dogs, sausage, bacon, and lunch meats. General instructions  Keep all follow-up visits as told by your health care provider. This is  important. ? This includes having regularly scheduled colonoscopies. ? Talk to your health care provider about when you need a colonoscopy. Contact a health care provider if:  You have new or worsening bleeding during a bowel movement.  You have new or increased blood in your stool.  You have a change in bowel habits.  You lose weight for no known reason. Summary  Polyps are tissue growths inside the body. Polyps can grow in many places, including the colon.  Most colon polyps are noncancerous (benign), but some can become cancerous over time.  This condition is diagnosed with a colonoscopy.  Treatment for this condition involves removing any polyps that are found. Most polyps can be removed during a colonoscopy. This information is not intended to replace advice given to you by your health care provider. Make sure you discuss any questions you have with your health care provider. Document Released: 08/14/2004 Document Revised: 03/05/2018 Document  Reviewed: 03/05/2018 Elsevier Patient Education  [employer].

## 2019-07-10 ENCOUNTER — Encounter: Payer: Self-pay | Admitting: Internal Medicine

## 2019-07-20 ENCOUNTER — Encounter (HOSPITAL_COMMUNITY): Payer: Self-pay | Admitting: Internal Medicine

## 2019-07-26 ENCOUNTER — Ambulatory Visit: Payer: Medicare HMO | Admitting: Thoracic Surgery (Cardiothoracic Vascular Surgery)

## 2019-08-11 ENCOUNTER — Ambulatory Visit (HOSPITAL_COMMUNITY)
Admission: RE | Admit: 2019-08-11 | Discharge: 2019-08-11 | Disposition: A | Discharge status: Home / Self Care | Payer: Medicare HMO | Source: Ambulatory Visit | Attending: Cardiology | Admitting: Cardiology

## 2019-08-11 ENCOUNTER — Other Ambulatory Visit: Payer: Self-pay

## 2019-08-11 DIAGNOSIS — I6523 Occlusion and stenosis of bilateral carotid arteries: Secondary | ICD-10-CM | POA: Insufficient documentation

## 2019-08-20 ENCOUNTER — Telehealth: Payer: Self-pay

## 2019-08-20 NOTE — Telephone Encounter (Signed)
-----   Message from Arnoldo Lenis, MD sent at 08/19/2019  3:42 PM EDT ----- Carotid US shows severe black on the right, mild to modarete on the left. We should have this looked at further by vascular surgery, can we please refer him for carotid stenosis. The blockage is at least to the point where we may need to consider some form of intervention vs continued monitoring, they will help [place] decide the best course of action   Zandra Abts MD

## 2019-08-20 NOTE — Telephone Encounter (Signed)
Called pt. No answer. Left message for pt to return call.  

## 2019-08-24 ENCOUNTER — Telehealth: Payer: Self-pay | Admitting: Cardiology

## 2019-08-24 ENCOUNTER — Telehealth: Payer: Self-pay

## 2019-08-24 DIAGNOSIS — I6523 Occlusion and stenosis of bilateral carotid arteries: Secondary | ICD-10-CM

## 2019-08-24 NOTE — Telephone Encounter (Signed)
Pt's wife made aware. Referral placed.

## 2019-08-24 NOTE — Telephone Encounter (Signed)
Pt notified he is not to start back at this time

## 2019-08-24 NOTE — Telephone Encounter (Signed)
-----   Message from Arnoldo Lenis, MD sent at 08/19/2019  3:42 PM EDT ----- Carotid US shows severe black on the right, mild to modarete on the left. We should have this looked at further by vascular surgery, can we please refer him for carotid stenosis. The blockage is at least to the point where we may need to consider some form of intervention vs continued monitoring, they will help [place] decide the best course of action   Zandra Abts MD

## 2019-08-24 NOTE — Telephone Encounter (Signed)
Please give pt a call-- has stopped plavix on 08/03/2019  Want to know if he needs to start it back

## 2019-08-26 ENCOUNTER — Telehealth: Payer: Self-pay | Admitting: Cardiology

## 2019-08-26 NOTE — Telephone Encounter (Signed)
Pt's wife called questioning if it was safe for her husband to wait 5 more weeks to see [facility]. She wondered if an urgent referral could be placed, Please advise.

## 2019-08-26 NOTE — Telephone Encounter (Signed)
Please give pt's wife a call concerning [facility] apt

## 2019-08-27 NOTE — Telephone Encounter (Signed)
Pt's wife made aware. Voiced understanding.

## 2019-08-27 NOTE — Telephone Encounter (Signed)
[place] for 5 weeks. He has asymptomatic carotid stenosis which is not an urgent issue. If he were to have any symptoms like vision change, weakness, slurred speech then that would change the timeframe. This blockage has been building for quite some time, ok to be evaluted in a few weeks   Zandra Abts MD

## 2019-08-30 ENCOUNTER — Telehealth (INDEPENDENT_AMBULATORY_CARE_PROVIDER_SITE_OTHER): Payer: Medicare HMO | Admitting: Thoracic Surgery (Cardiothoracic Vascular Surgery)

## 2019-08-30 ENCOUNTER — Other Ambulatory Visit: Payer: Self-pay

## 2019-08-30 DIAGNOSIS — I2510 Atherosclerotic heart disease of native coronary artery without angina pectoris: Secondary | ICD-10-CM

## 2019-08-30 DIAGNOSIS — Z951 Presence of aortocoronary bypass graft: Secondary | ICD-10-CM

## 2019-08-30 NOTE — Progress Notes (Signed)
[address]       [place] [address]             [phone]     CARDIOTHORACIC SURGERY TELEPHONE VIRTUAL OFFICE NOTE  Referring Provider is Jettie Booze, MD Primary Cardiologist is Glenetta Hew, MD PCP is Jani Gravel, MD   HPI:  I spoke with Timothy Simpson (DOB 1946-09-10 ) via telephone on 08/30/2019 at 3:31 PM and verified that I was speaking with the correct person using more than one form of identification.  We discussed the reason(s) for conducting our visit virtually instead of in-person.  The patient expressed understanding the circumstances and agreed to proceed as described.   Patient is a 73 year old African-American male with history of hypertension, hyperlipidemia, and long-standing tobacco abuse who underwent coronary artery bypass grafting x3 on August 05, 2018 for severe left main coronary artery disease status post acute non-ST segment elevation myocardial infarction.  Routine follow-up echocardiogram performed August 10, 2018 revealed no significant wall motion abnormalities and left ventricular ejection fraction estimated 55 to 60%.  He was last seen here in our office on September 14, 2018 at which time he was doing very well.  Since then he completed the cardiac rehab program and has been seen in follow-up by Dr. Harl Bowie, most recently on June 08, 2019.  Plavix was stopped at that time.  Recent carotid duplex scan confirms the presence of findings consistent with greater than 70% stenosis of the right internal carotid artery.  The patient has not had any transient neurologic symptoms but has now been referred to [facility] for consultation.  I spoke with the patient over the telephone today.  He reports that he is doing exceptionally well.  He is quite active physically.  He denies any symptoms of chest pain or shortness of breath either with activity or at rest.  He is compliant with medications.  He does still smoke cigarettes.   Current  Outpatient Medications  Medication Sig Dispense Refill  . allopurinol (ZYLOPRIM) 100 MG tablet Take 100 mg by mouth daily.    Marland Kitchen aspirin EC 81 MG EC tablet Take 1 tablet (81 mg total) by mouth daily.    Marland Kitchen atorvastatin (LIPITOR) 80 MG tablet Take 1 tablet (80 mg total) by mouth daily at 6 PM. 30 tablet 1  . cetirizine (ZYRTEC) 10 MG tablet Take 10 mg by mouth daily as needed for allergies.    Marland Kitchen clopidogrel (PLAVIX) 75 MG tablet Take 1 tablet (75 mg total) by mouth daily. 30 tablet 1  . ferrous sulfate 325 (65 FE) MG EC tablet Take 1 tablet (325 mg total) by mouth 2 (two) times daily. 60 tablet 3  . hydroxypropyl methylcellulose / hypromellose (ISOPTO TEARS / GONIOVISC) 2.5 % ophthalmic solution Place 1 drop into both eyes 3 (three) times daily as needed for dry eyes.    Marland Kitchen lisinopril (ZESTRIL) 2.5 MG tablet Take 1 tablet (2.5 mg total) by mouth daily. 90 tablet 3  . metoprolol tartrate (LOPRESSOR) 25 MG tablet Take 1 tablet (25 mg total) by mouth 2 (two) times daily. 60 tablet 3  . oxymetazoline (AFRIN) 0.05 % nasal spray Place 1 spray into both nostrils 2 (two) times daily as needed for congestion.    . polyethylene glycol-electrolytes (TRILYTE) 420 g solution Take 4,000 mLs by mouth as directed. 4000 mL 0   No current facility-administered medications for this visit.      Diagnostic Tests:  n/a  Impression:  Patient is doing well from a cardiac standpoint approximately 1 year status post coronary artery bypass grafting  Plan:  We have not recommended any change the patient's current medications.  I have strongly encouraged the patient to find a way to quit smoking.  He will continue to follow-up regularly with Dr. Harl Bowie and return to our office in the future only should specific problems or questions arise.    I discussed limitations of evaluation and management via telephone.  The patient was advised to call back for repeat telephone consultation or to seek an in-person evaluation  if questions arise or the patient's clinical condition changes in any significant manner.  I spent in excess of 10 minutes of non-face-to-face time during the conduct of this telephone virtual office consultation.     [name], MD 08/30/2019 3:31 PM

## 2019-08-30 NOTE — Patient Instructions (Addendum)
Continue all previous medications without any changes at this time  Stop smoking immediately and permanently.  

## 2019-09-24 ENCOUNTER — Other Ambulatory Visit: Payer: Self-pay

## 2019-09-24 DIAGNOSIS — I6523 Occlusion and stenosis of bilateral carotid arteries: Secondary | ICD-10-CM

## 2019-09-25 IMAGING — DX DG FOOT COMPLETE 3+V*R*
3 series · 3 of 3 positions shown · non-contrast
Comparison: None.

CLINICAL DATA: Right posterior heel pain for 1 month

EXAM:
RIGHT FOOT COMPLETE - 3+ VIEW

[foot ap]
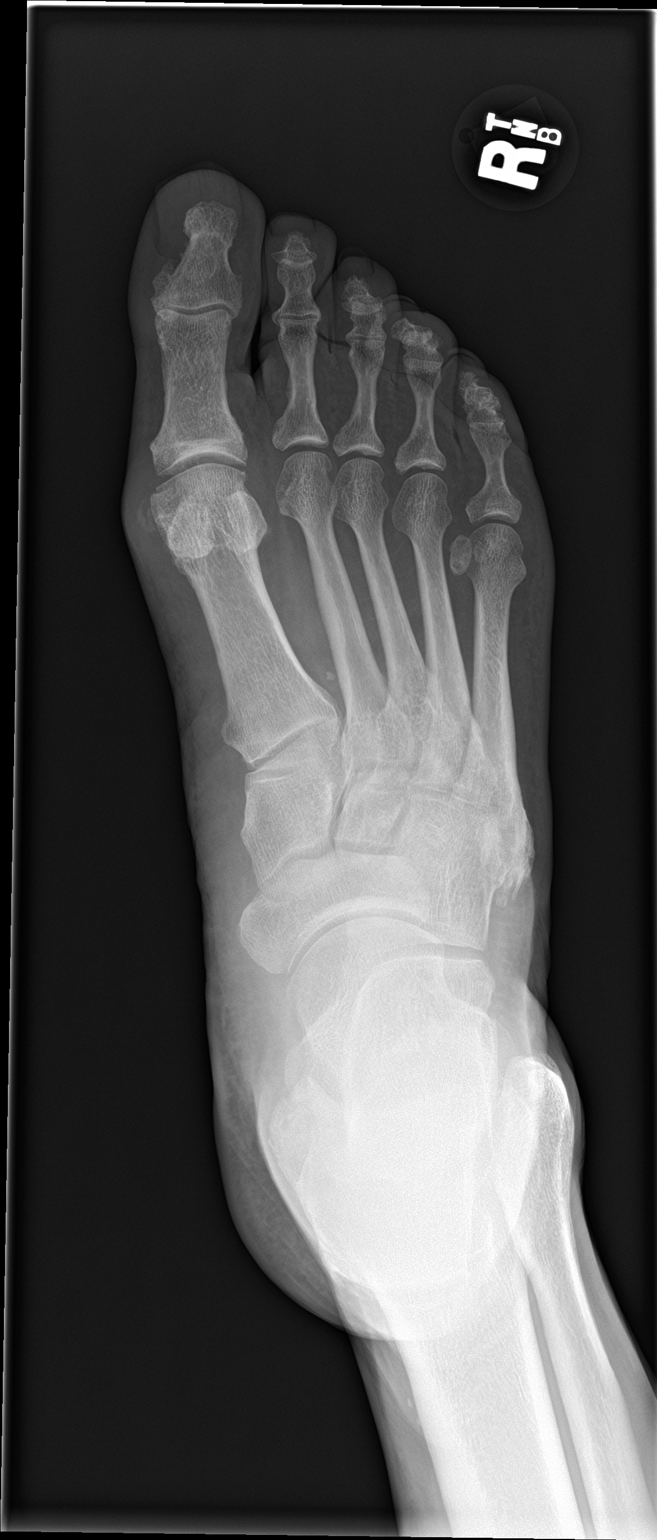

[foot obl]
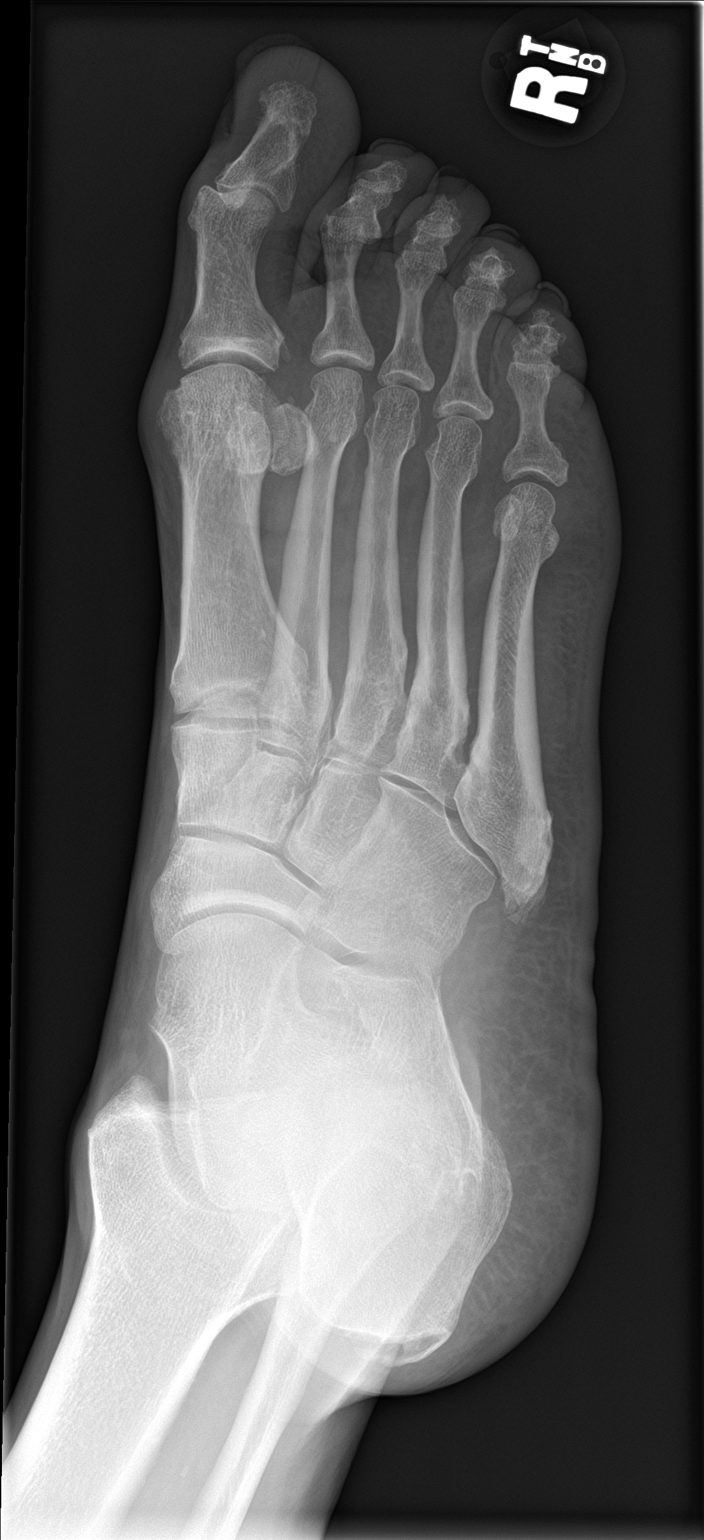

[foot lat]
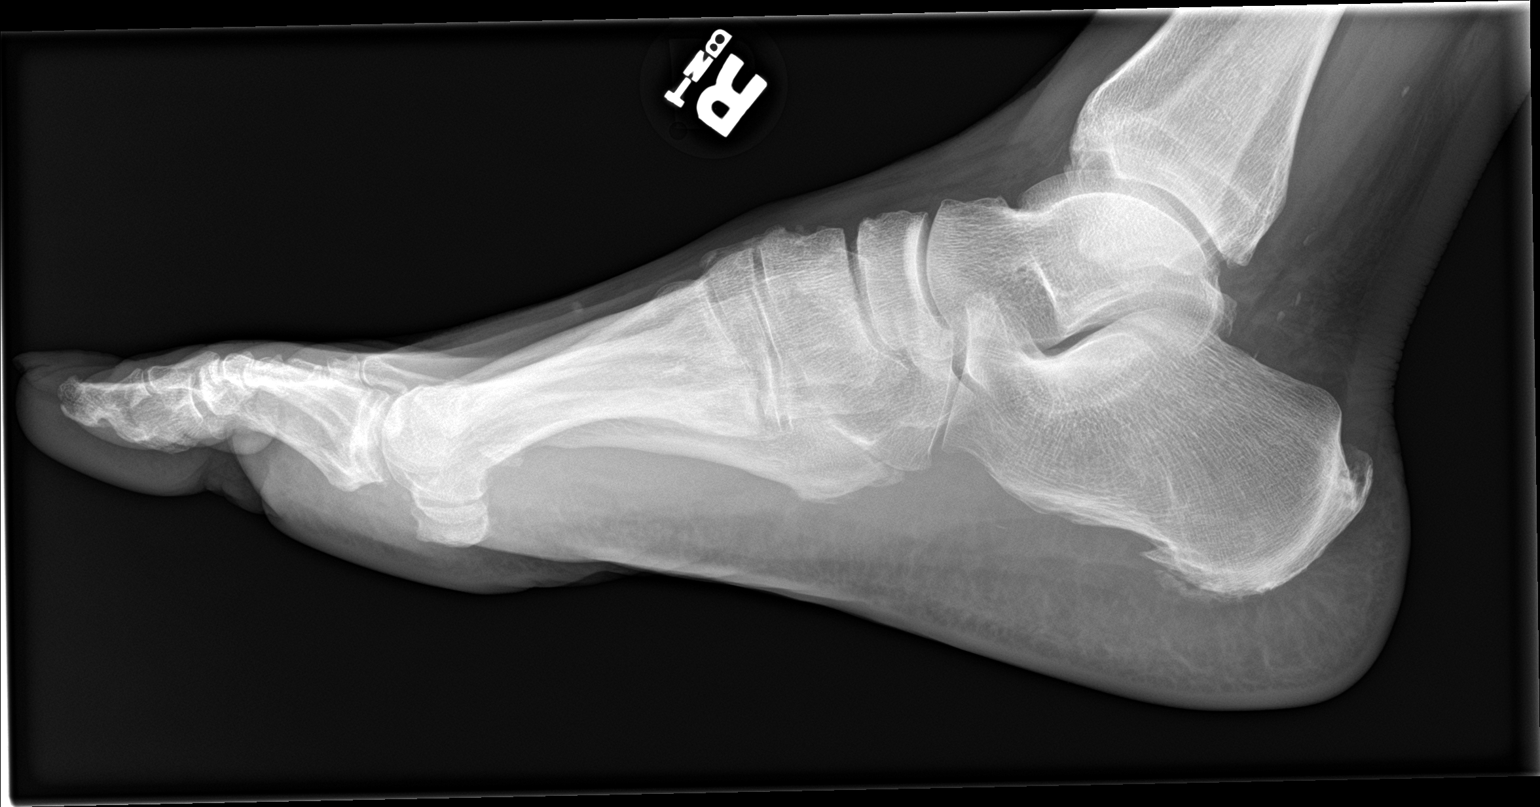

[3 of 3 positions shown; findings below may reference images not displayed]

FINDINGS: There is no evidence of fracture or dislocation. There is mild
osteoarthritis of the first MTP joint. There is a small plantar
calcaneal spur. Soft tissues are unremarkable.
IMPRESSION: No acute osseous injury of the right foot.

## 2019-09-27 ENCOUNTER — Telehealth (HOSPITAL_COMMUNITY): Payer: Self-pay | Admitting: *Deleted

## 2019-09-27 NOTE — Telephone Encounter (Signed)

## 2019-09-28 ENCOUNTER — Encounter: Payer: Self-pay | Admitting: *Deleted

## 2019-09-28 ENCOUNTER — Ambulatory Visit (HOSPITAL_COMMUNITY)
Admission: RE | Admit: 2019-09-28 | Discharge: 2019-09-28 | Disposition: A | Discharge status: Home / Self Care | Payer: Medicare HMO | Source: Ambulatory Visit | Attending: Vascular Surgery | Admitting: Vascular Surgery

## 2019-09-28 ENCOUNTER — Other Ambulatory Visit: Payer: Self-pay | Admitting: *Deleted

## 2019-09-28 ENCOUNTER — Other Ambulatory Visit: Payer: Self-pay

## 2019-09-28 ENCOUNTER — Ambulatory Visit (INDEPENDENT_AMBULATORY_CARE_PROVIDER_SITE_OTHER): Payer: Medicare HMO | Admitting: Vascular Surgery

## 2019-09-28 ENCOUNTER — Encounter: Payer: Self-pay | Admitting: Vascular Surgery

## 2019-09-28 DIAGNOSIS — I6529 Occlusion and stenosis of unspecified carotid artery: Secondary | ICD-10-CM | POA: Insufficient documentation

## 2019-09-28 DIAGNOSIS — I6521 Occlusion and stenosis of right carotid artery: Secondary | ICD-10-CM

## 2019-09-28 DIAGNOSIS — I6523 Occlusion and stenosis of bilateral carotid arteries: Secondary | ICD-10-CM | POA: Diagnosis not present

## 2019-09-28 NOTE — Progress Notes (Signed)
Patient name: Timothy Simpson MRN: [id number] DOB: November 28, 1946 Sex: male  REASON FOR CONSULT: Asymptomatic high-grade right carotid stenosis  HPI: Timothy Simpson is a 73 y.o. 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  Patient states his carotid disease was initially diagnosed last year when he had heart surgery.  He denies any previous history of TIAs or strokes.  No vision loss.  No weakness or numbness in arm or leg.  He still smoking at least 8 cigarettes a day.  He has recovered from his heart surgery without any issues.  States his cardiologist is Dr. Harl Bowie.  No previous head or neck surgery.  Takes aspirin and plavix daily.  Past Medical History:  Diagnosis Date  . Coronary artery disease   . Gout   . High cholesterol   . Hypertension   . NSTEMI (non-ST elevated myocardial infarction) ([facility]) 07/29/2018  . 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 Tobacco abuse     Past Surgical History:  Procedure Laterality Date  . BACK SURGERY    . COLONOSCOPY  2004   Dr. Gala Romney: Normal.  For history of tubulovillous adenoma back in 2001, five-year follow-up recommended.  . COLONOSCOPY  2009   Dr. Emerson Monte: 6 mm polyp in the ascending segment and one in the sigmoid colon removed.  Pathology showed tubular adenomas.  5-year surveillance colonoscopy recommended.  . COLONOSCOPY N/A 07/07/2019   Procedure: COLONOSCOPY;  Surgeon: Daneil Dolin, MD;  [facility];  Service: Endoscopy;  Laterality: N/A;  1:00pm  . CORONARY ARTERY BYPASS GRAFT N/A 08/05/2018   Procedure: CORONARY ARTERY BYPASS GRAFTING (CABG) x3. Endoscopic Saphenous vein harvest.;  Surgeon: Rexene Alberts, MD;   Location: [facility];  Service: Open Heart Surgery;  Laterality: N/A;  . INTRAVASCULAR PRESSURE WIRE/FFR STUDY N/A 08/04/2018   Procedure: INTRAVASCULAR PRESSURE WIRE/FFR STUDY;  Surgeon: Burnell Blanks, MD;  Location: [facility];  Service: Cardiovascular;  Laterality: N/A;  . LEFT HEART CATH AND CORONARY ANGIOGRAPHY N/A 07/30/2018   Procedure: LEFT HEART CATH AND CORONARY ANGIOGRAPHY;  Surgeon: Jettie Booze, MD;  Location: [facility];  Service: Cardiovascular;  Laterality: N/A;  . POLYPECTOMY  07/07/2019   Procedure: POLYPECTOMY;  Surgeon: Daneil Dolin, MD;  [facility];  Service: Endoscopy;;  . TEE WITHOUT CARDIOVERSION N/A 08/05/2018   Procedure: TRANSESOPHAGEAL ECHOCARDIOGRAM (TEE);  Surgeon: Rexene Alberts, MD;  Location: [facility];  Service: Open Heart Surgery;  Laterality: N/A;    Family History  Problem Relation Age of Onset  . Hypertension Father     SOCIAL HISTORY: Social History   Socioeconomic History  . Marital status: Married    Spouse name: Not on file  . Number of children: Not on file  . Years of education: Not on file  . Highest education level: Not on file  Occupational History  . Not on file  Social Needs  . Financial resource strain: Not on file  . Food insecurity    Worry: Not on file    Inability: Not on file  . Transportation needs    Medical: Not on file    Non-medical: Not on file  Tobacco Use  .  Smoking status: Current Every Day Smoker    Packs/day: 0.25    Years: 40.00    Pack years: 10.00    Types: Cigarettes  . Smokeless tobacco: Never Used  . Tobacco comment: he sated that he would quit if he was told he had to.   Substance and Sexual Activity  . Alcohol use: No  . Drug use: No  . Sexual activity: Not on file  Lifestyle  . Physical activity    Days per week: Not on file    Minutes per session: Not on file  . Stress: Not on file  Relationships  . Social Herbalist on phone: Not on file     Gets together: Not on file    Attends religious service: Not on file    Active member of club or organization: Not on file    Attends meetings of clubs or organizations: Not on file    Relationship status: Not on file  . Intimate partner violence    Fear of current or ex partner: Not on file    Emotionally abused: Not on file    Physically abused: Not on file    Forced sexual activity: Not on file  Other Topics Concern  . Not on file  Social History Narrative  . Not on file    No Known Allergies  Current Outpatient Medications  Medication Sig Dispense Refill  . allopurinol (ZYLOPRIM) 100 MG tablet Take 100 mg by mouth daily.    Marland Kitchen aspirin EC 81 MG EC tablet Take 1 tablet (81 mg total) by mouth daily.    Marland Kitchen atorvastatin (LIPITOR) 80 MG tablet Take 1 tablet (80 mg total) by mouth daily at 6 PM. 30 tablet 1  . cetirizine (ZYRTEC) 10 MG tablet Take 10 mg by mouth daily as needed for allergies.    Marland Kitchen clopidogrel (PLAVIX) 75 MG tablet Take 1 tablet (75 mg total) by mouth daily. 30 tablet 1  . hydroxypropyl methylcellulose / hypromellose (ISOPTO TEARS / GONIOVISC) 2.5 % ophthalmic solution Place 1 drop into both eyes 3 (three) times daily as needed for dry eyes.    . metoprolol tartrate (LOPRESSOR) 25 MG tablet Take 1 tablet (25 mg total) by mouth 2 (two) times daily. 60 tablet 3  . oxymetazoline (AFRIN) 0.05 % nasal spray Place 1 spray into both nostrils 2 (two) times daily as needed for congestion.    . polyethylene glycol-electrolytes (TRILYTE) 420 g solution Take 4,000 mLs by mouth as directed. 4000 mL 0  . ferrous sulfate 325 (65 FE) MG EC tablet Take 1 tablet (325 mg total) by mouth 2 (two) times daily. 60 tablet 3  . lisinopril (ZESTRIL) 2.5 MG tablet Take 1 tablet (2.5 mg total) by mouth daily. 90 tablet 3   No current facility-administered medications for this visit.     REVIEW OF SYSTEMS:  [X]  denotes positive finding, [ ]  denotes negative finding Cardiac  Comments:  Chest pain  or chest pressure:    Shortness of breath upon exertion:    Short of breath when lying flat:    Irregular heart rhythm:        Vascular    Pain in calf, thigh, or hip brought on by ambulation:    Pain in feet at night that wakes you up from your sleep:     Blood clot in your veins:    Leg swelling:         Pulmonary    Oxygen at  home:    Productive cough:     Wheezing:         Neurologic    Sudden weakness in arms or legs:     Sudden numbness in arms or legs:     Sudden onset of difficulty speaking or slurred speech:    Temporary loss of vision in one eye:     Problems with dizziness:         Gastrointestinal    Blood in stool:     Vomited blood:         Genitourinary    Burning when urinating:     Blood in urine:        Psychiatric    Major depression:         Hematologic    Bleeding problems:    Problems with blood clotting too easily:        Skin    Rashes or ulcers:        Constitutional    Fever or chills:      PHYSICAL EXAM: Vitals:   09/28/19 1120 09/28/19 1125 09/28/19 1126  BP: (!) 183/101 (!) 158/88 (!) 167/86  Pulse: 64 64 64  Resp: 18    Temp: (!) 97.3 F (36.3 C)    TempSrc: Temporal    SpO2: 99%    Weight: 208 lb (94.3 kg)    Height: 6' (1.829 m)      GENERAL: The patient is a well-nourished male, in no acute distress. The vital signs are documented above. CARDIAC: There is a regular rate and rhythm.  Vascular: Weak right radial pulse, strong left radial pulse PULMONARY: There is good air exchange bilaterally without wheezing or rales. ABDOMEN: Soft and non-tender with normal pitched bowel sounds.  MUSCULOSKELETAL: There are no major deformities or cyanosis. NEUROLOGIC: No focal weakness or paresthesias are detected.  Cranial nerves II through XII grossly intact. SKIN: There are no ulcers or rashes noted. PSYCHIATRIC: The patient has a normal affect.  DATA:   Carotid duplex shows a greater than 80% right ICA stenosis with a velocity  of 384/121.  No significant left carotid disease.  Assessment/Plan:   73 y.o. male, with history of hypertension hyperlipidemia tobacco abuse as well as coronary artery disease with history of NSTEMI and status post three-vessel CABG last year 2019 that presents for evaluation of asymptomatic high-grade right ICA stenosis.    I have recommended right carotid endarterectomy given greater than 80% stenosis.  Discussed goal of surgery is stroke risk reduction.   I discussed the details of the surgery with him and his wife today.  Risk benefits were discussed including risk of cranial nerve injury, bleeding requiring return to the OR, 1% risk of perioperative stroke, risk of anesthesia etc.  He wants to delay surgery untill after Thanksgiving which I told him would be okay given this has been an asymptomatic lesion that was identified initially last year.  We will get clearance with Dr. Harl Bowie (his cardiologist) to ensure that he is safe from an anesthetic standpoint.   Marty Heck, MD [facility] Office: [phone] Pager: [employer]

## 2019-10-05 ENCOUNTER — Encounter: Payer: Medicare HMO | Admitting: Vascular Surgery

## 2019-10-05 ENCOUNTER — Encounter (HOSPITAL_COMMUNITY): Payer: Medicare HMO

## 2019-10-13 ENCOUNTER — Other Ambulatory Visit: Payer: Self-pay

## 2019-10-13 ENCOUNTER — Encounter (HOSPITAL_COMMUNITY): Payer: Self-pay

## 2019-10-13 ENCOUNTER — Encounter (HOSPITAL_COMMUNITY)
Admission: RE | Admit: 2019-10-13 | Discharge: 2019-10-13 | Disposition: A | Discharge status: Home / Self Care | Payer: Medicare HMO | Source: Ambulatory Visit | Attending: Vascular Surgery | Admitting: Vascular Surgery

## 2019-10-13 DIAGNOSIS — I10 Essential (primary) hypertension: Secondary | ICD-10-CM | POA: Insufficient documentation

## 2019-10-13 DIAGNOSIS — I2510 Atherosclerotic heart disease of native coronary artery without angina pectoris: Secondary | ICD-10-CM | POA: Diagnosis not present

## 2019-10-13 DIAGNOSIS — Z01818 Encounter for other preprocedural examination: Secondary | ICD-10-CM | POA: Insufficient documentation

## 2019-10-13 DIAGNOSIS — Z951 Presence of aortocoronary bypass graft: Secondary | ICD-10-CM | POA: Diagnosis not present

## 2019-10-13 DIAGNOSIS — I4510 Unspecified right bundle-branch block: Secondary | ICD-10-CM | POA: Insufficient documentation

## 2019-10-13 LAB — URINALYSIS, ROUTINE W REFLEX MICROSCOPIC
Bilirubin Urine: NEGATIVE
Glucose, UA: NEGATIVE mg/dL
Hgb urine dipstick: NEGATIVE
Ketones, ur: NEGATIVE mg/dL
Leukocytes,Ua: NEGATIVE
Nitrite: NEGATIVE
Protein, ur: NEGATIVE mg/dL
Specific Gravity, Urine: 1.006 (ref 1.005–1.030)
pH: 6 (ref 5.0–8.0)

## 2019-10-13 LAB — COMPREHENSIVE METABOLIC PANEL
ALT: 20 U/L (ref 0–44)
AST: 25 U/L (ref 15–41)
Albumin: 4.3 g/dL (ref 3.5–5.0)
Alkaline Phosphatase: 103 U/L (ref 38–126)
Anion gap: 10 (ref 5–15)
BUN: 8 mg/dL (ref 8–23)
CO2: 30 mmol/L (ref 22–32)
Calcium: 9.6 mg/dL (ref 8.9–10.3)
Chloride: 101 mmol/L (ref 98–111)
Creatinine, Ser: 0.99 mg/dL (ref 0.61–1.24)
GFR calc Af Amer: >60 mL/min (ref >60)
GFR calc non Af Amer: >60 mL/min (ref >60)
Glucose, Bld: 82 mg/dL (ref 70–99)
Potassium: 4.4 mmol/L (ref 3.5–5.1)
Sodium: 141 mmol/L (ref 135–145)
Total Bilirubin: 1 mg/dL (ref 0.3–1.2)
Total Protein: 7.2 g/dL (ref 6.5–8.1)

## 2019-10-13 LAB — CBC
HCT: 44.9 % (ref 39.0–52.0)
Hemoglobin: 14.8 g/dL (ref 13.0–17.0)
MCH: 29.8 pg (ref 26.0–34.0)
MCHC: 33 g/dL (ref 30.0–36.0)
MCV: 90.5 fL (ref 80.0–100.0)
Platelets: 217 10*3/uL (ref 150–400)
RBC: 4.96 MIL/uL (ref 4.22–5.81)
RDW: 12.5 % (ref 11.5–15.5)
WBC: 7.7 10*3/uL (ref 4.0–10.5)
nRBC: 0 % (ref 0.0–0.2)

## 2019-10-13 LAB — SURGICAL PCR SCREEN
MRSA, PCR: NEGATIVE
Staphylococcus aureus: NEGATIVE

## 2019-10-13 LAB — TYPE AND SCREEN
ABO/RH(D): O POS
Antibody Screen: NEGATIVE

## 2019-10-13 LAB — PROTIME-INR
INR: 1.1 (ref 0.8–1.2)
Prothrombin Time: 13.9 seconds (ref 11.4–15.2)

## 2019-10-13 LAB — APTT: aPTT: 29 seconds (ref 24–36)

## 2019-10-13 NOTE — Pre-Procedure Instructions (Addendum)
[facility] [address], [place] [address] [address] [place] [address] Phone: [phone] Fax: [place], [place] - [address] [place] [address] Phone: [phone] Fax: [facility] [place], [place] [address] Phone: [phone] Fax: [phone]     Your procedure is scheduled on Monday, November 16th.  Report to [facility] Main Entrance "A" at 05:30 A.M., and check in at the Admitting office.  Call this number if you have problems the morning of surgery:  [phone]   Remember:  Do not eat or drink after midnight the night before your surgery     Take these medicines the morning of surgery with A SIP OF WATER  allopurinol (ZYLOPRIM) donepezil (ARICEPT) metoprolol tartrate (LOPRESSOR)   IF NEEDED: cetirizine (ZYRTEC)   oxymetazoline (AFRIN) 0.05 % nasal spray hydroxypropyl methylcellulose / hypromellose (ISOPTO TEARS / GONIOVISC) 2.5 % ophthalmic solution  Follow your surgeon's instructions on when to stop Aspirin.  If no instructions were given by your surgeon then you will need to call the office to get those instructions.    As of today, STOP taking any Aspirin (unless otherwise instructed by your surgeon), Aleve, Naproxen, Ibuprofen, Motrin, Advil, Goody's, BC's, all herbal medications, fish oil, and all vitamins.    The Morning of Surgery  Do not wear jewelry.  Do not wear lotions, powders, colognes, or deodorant  Do not shave 48 hours prior to surgery.  Men may shave face and neck.  Do not bring valuables to the hospital.  [facility] is not responsible for any belongings or valuables.  If you are a smoker, DO NOT Smoke 24 hours prior to surgery IF you wear a CPAP at night please bring your mask, tubing, and machine the morning of surgery   Remember that you must have  someone to transport you home after your surgery, and remain with you for 24 hours if you are discharged the same day.   Contacts, glasses, hearing aids, dentures or bridgework may not be worn into surgery.   Leave your suitcase in the car.  After surgery it may be brought to your room.  For patients admitted to the hospital, discharge time will be determined by your treatment team.  Patients discharged the day of surgery will not be allowed to drive home.    Special instructions:   - Preparing For Surgery  Before surgery, you can play an important role. Because skin is not sterile, your skin needs to be as free of germs as possible. You can reduce the number of germs on your skin by washing with CHG (chlorahexidine gluconate) Soap before surgery.  CHG is an antiseptic cleaner which kills germs and bonds with the skin to continue killing germs even after washing.    Please do not use if you have an allergy to CHG or antibacterial soaps. If your skin becomes reddened/irritated stop using the CHG.  Do not shave (including legs and underarms) for at least 48 hours prior to first CHG shower. It is OK to shave your face.  Please follow these instructions carefully.   1. Shower the NIGHT BEFORE SURGERY and the MORNING OF SURGERY with CHG Soap.   2. If you chose to wash your hair, wash your hair first as usual with your normal shampoo.  3. After you shampoo, rinse your  hair and body thoroughly to remove the shampoo.  4. Use CHG as you would any other liquid soap. You can apply CHG directly to the skin and wash gently with a scrungie or a clean washcloth.   5. Apply the CHG Soap to your body ONLY FROM THE NECK DOWN.  Do not use on open wounds or open sores. Avoid contact with your eyes, ears, mouth and genitals (private parts). Wash Face and genitals (private parts)  with your normal soap.   6. Wash thoroughly, paying special attention to the area where your surgery will be  performed.  7. Thoroughly rinse your body with warm water from the neck down.  8. DO NOT shower/wash with your normal soap after using and rinsing off the CHG Soap.  9. Pat yourself dry with a CLEAN TOWEL.  10. Wear CLEAN PAJAMAS to bed the night before surgery, wear comfortable clothes the morning of surgery  11. Place CLEAN SHEETS on your bed the night of your first shower and DO NOT SLEEP WITH PETS.    Day of Surgery:  Remember - BRUSH YOUR TEETH THE MORNING OF SURGERY WITH YOUR REGULAR TOOTHPASTE Do not apply any deodorants/lotions. Please shower the morning of surgery with the CHG soap  Please wear clean clothes to the hospital/surgery center.   Remember to brush your teeth WITH YOUR REGULAR TOOTHPASTE.   Please read over the following fact sheets that you were given.

## 2019-10-13 NOTE — Progress Notes (Signed)
PCP - Jani Gravel, MD Cardiologist - Arnoldo Lenis, MD  PPM/ICD - Denies  Chest x-ray - N/A EKG - 10/13/2019 Stress Test - Denies ECHO - 08/10/2018 Cardiac Cath - 07/30/2018  Sleep Study - Denies  Patient denies being a diabetic  Blood Thinner Instructions: Per patient, last dose 10/07/2019 Aspirin Instructions: Continue Taking ASA per Dr. Ainsley Spinner office  ERAS Protcol - None PRE-SURGERY Ensure or G2- N/A   Anesthesia review: Yes, Cardiac Hx.   COVID TEST- 10/14/2019 Patient denies shortness of breath, fever, cough and chest pain at PAT appointment  Coronavirus Screening  Have you experienced the following symptoms:  Cough yes/no: No Fever (>100.36F)  yes/no: No Runny nose yes/no: No Sore throat yes/no: No Difficulty breathing/shortness of breath  yes/no: No Have you or a family member traveled in the last 14 days and where? yes/no: No   If the patient indicates "YES" to the above questions, their PAT will be rescheduled to limit the exposure to others and, the surgeon will be notified. THE PATIENT WILL NEED TO BE ASYMPTOMATIC FOR 14 DAYS.   If the patient is not experiencing any of these symptoms, the PAT nurse will instruct them to NOT bring anyone with them to their appointment since they may have these symptoms or traveled as well.   Please remind your patients and families that hospital visitation restrictions are in effect and the importance of the restrictions.     All instructions explained to the patient, with a verbal understanding of the material. Patient agrees to go over the instructions while at home for a better understanding. Patient also instructed to self quarantine after being tested for COVID-19. The opportunity to ask questions was provided.

## 2019-10-14 ENCOUNTER — Other Ambulatory Visit (HOSPITAL_COMMUNITY)
Admission: RE | Admit: 2019-10-14 | Discharge: 2019-10-14 | Disposition: A | Discharge status: Home / Self Care | Payer: Medicare HMO | Source: Ambulatory Visit | Attending: Vascular Surgery | Admitting: Vascular Surgery

## 2019-10-14 DIAGNOSIS — Z20828 Contact with and (suspected) exposure to other viral communicable diseases: Secondary | ICD-10-CM | POA: Insufficient documentation

## 2019-10-14 DIAGNOSIS — Z01812 Encounter for preprocedural laboratory examination: Secondary | ICD-10-CM | POA: Diagnosis present

## 2019-10-14 LAB — SARS CORONAVIRUS 2 (TAT 6-24 HRS): SARS Coronavirus 2: NEGATIVE

## 2019-10-14 NOTE — Progress Notes (Signed)
Anesthesia Chart Review: Follows with Dr. Harl Bowie for hx of CAD s/p CABG 08/2018. Echo 08/2018 LVEF 55-60%, no WMAs, grade II diastolic dysfunction. Last seen by Dr. Harl Bowie 06/08/19, doing well at that time, no CV symptoms. He was cleared for surgery as low risk - copy of clearance on chart.  Preop labs reviewed, WNL.   EKG 10/13/19: Sinus rhythm with 1st degree A-V block. Rate 72. Incomplete right bundle branch block.  TTE 08/10/18: - Left ventricle: The cavity size was normal. Systolic function was   normal. The estimated ejection fraction was in the range of 55%   to 60%. Wall motion was normal; there were no regional wall   motion abnormalities. Features are consistent with a pseudonormal   left ventricular filling pattern, with concomitant abnormal   relaxation and increased filling pressure (grade 2 diastolic   dysfunction). - Aortic valve: Valve mobility was restricted. Transvalvular   velocity was within the normal range. There was no stenosis.   There was no regurgitation. - Mitral valve: Transvalvular velocity was within the normal range.   There was no evidence for stenosis. There was mild regurgitation. - Right ventricle: The cavity size was normal. Wall thickness was   normal. Systolic function was normal. - Atrial septum: No defect or patent foramen ovale was identified   by color flow Doppler. - Tricuspid valve: There was mild regurgitation.   Wynonia Musty [facility] Short Stay Center/Anesthesiology Phone [phone] 10/14/2019 10:25 AM

## 2019-10-17 NOTE — Anesthesia Preprocedure Evaluation (Addendum)
Anesthesia Evaluation  Patient identified by MRN, date of birth, ID band Patient awake    Reviewed: Allergy & Precautions, NPO status , Patient's Chart, lab work & pertinent test results  History of Anesthesia Complications Negative for: history of anesthetic complications  Airway Mallampati: III  TM Distance: <3 FB Neck ROM: Full    Dental  (+) Chipped, Poor Dentition, Missing   Pulmonary Current Smoker and Patient abstained from smoking.,    breath sounds clear to auscultation       Cardiovascular hypertension, Pt. on medications + CAD and + Past MI   Rhythm:Regular     Neuro/Psych negative neurological ROS  negative psych ROS   GI/Hepatic negative GI ROS, Neg liver ROS,   Endo/Other  negative endocrine ROS  Renal/GU negative Renal ROS     Musculoskeletal negative musculoskeletal ROS (+)   Abdominal   Peds  Hematology negative hematology ROS (+) anemia ,   Anesthesia Other Findings Cardiac catheterization was performed today which showed complex anatomy at the distal left main with disease up to 50% followed by aneurysmal section.  95% ostial ramus stenosis, distal RCA stenosis is 100%, mid circumflex stenosis is 70%, and proximal LAD stenosis 60%.  Estimated left ventricular ejection fraction on cath is 55 to 65%.   Reproductive/Obstetrics                           Anesthesia Physical Anesthesia Plan  ASA: III  Anesthesia Plan: General   Post-op Pain Management:    Induction: Intravenous  PONV Risk Score and Plan: 2 and Ondansetron, Dexamethasone and Treatment may vary due to age or medical condition  Airway Management Planned: Oral ETT  Additional Equipment: Arterial line  Intra-op Plan:   Post-operative Plan: Extubation in OR  Informed Consent: I have reviewed the patients History and Physical, chart, labs and discussed the procedure including the risks, benefits and  alternatives for the proposed anesthesia with the patient or authorized representative who has indicated his/her understanding and acceptance.     Dental advisory given  Plan Discussed with: CRNA  Anesthesia Plan Comments: (Remifentanil gtt      Timothy Simpson:  Follows with Dr. Harl Bowie for hx of CAD s/p CABG 08/2018. Echo 08/2018 LVEF 55-60%, no WMAs, grade II diastolic dysfunction. Last seen by Dr. Harl Bowie 06/08/19, doing well at that time, no CV symptoms. He was cleared for surgery as low risk - copy of clearance on chart.  Preop labs reviewed, WNL.   EKG 10/13/19: Sinus rhythm with 1st degree A-V block. Rate 72. Incomplete right bundle branch block.  TTE 08/10/18: - Left ventricle: The cavity size was normal. Systolic function was   normal. The estimated ejection fraction was in the range of 55%   to 60%. Wall motion was normal; there were no regional wall   motion abnormalities. Features are consistent with a pseudonormal   left ventricular filling pattern, with concomitant abnormal   relaxation and increased filling pressure (grade 2 diastolic   dysfunction). - Aortic valve: Valve mobility was restricted. Transvalvular   velocity was within the normal range. There was no stenosis.   There was no regurgitation. - Mitral valve: Transvalvular velocity was within the normal range.   There was no evidence for stenosis. There was mild regurgitation. - Right ventricle: The cavity size was normal. Wall thickness was   normal. Systolic function was normal. - Atrial septum: No defect or patent foramen ovale was identified  by color flow Doppler. - Tricuspid valve: There was mild regurgitation. )        Anesthesia Quick Evaluation

## 2019-10-18 ENCOUNTER — Encounter (HOSPITAL_COMMUNITY): Payer: Self-pay

## 2019-10-18 ENCOUNTER — Other Ambulatory Visit: Payer: Self-pay

## 2019-10-18 ENCOUNTER — Inpatient Hospital Stay (HOSPITAL_COMMUNITY): Payer: Medicare HMO | Admitting: Anesthesiology

## 2019-10-18 ENCOUNTER — Inpatient Hospital Stay (HOSPITAL_COMMUNITY): Payer: Medicare HMO | Admitting: Physician Assistant

## 2019-10-18 ENCOUNTER — Inpatient Hospital Stay (HOSPITAL_COMMUNITY)
Admission: RE | Admit: 2019-10-18 | Discharge: 2019-10-19 | DRG: 039 | Disposition: A | Discharge status: Home / Self Care | Payer: Medicare HMO | Attending: Vascular Surgery | Admitting: Vascular Surgery

## 2019-10-18 ENCOUNTER — Encounter (HOSPITAL_COMMUNITY)
Admission: RE | Disposition: A | Discharge status: Home / Self Care | Payer: Self-pay | Source: Home / Self Care | Attending: Vascular Surgery

## 2019-10-18 DIAGNOSIS — Z951 Presence of aortocoronary bypass graft: Secondary | ICD-10-CM | POA: Diagnosis not present

## 2019-10-18 DIAGNOSIS — Z7982 Long term (current) use of aspirin: Secondary | ICD-10-CM

## 2019-10-18 DIAGNOSIS — I252 Old myocardial infarction: Secondary | ICD-10-CM

## 2019-10-18 DIAGNOSIS — F17210 Nicotine dependence, cigarettes, uncomplicated: Secondary | ICD-10-CM | POA: Diagnosis present

## 2019-10-18 DIAGNOSIS — Z23 Encounter for immunization: Secondary | ICD-10-CM | POA: Diagnosis not present

## 2019-10-18 DIAGNOSIS — E7800 Pure hypercholesterolemia, unspecified: Secondary | ICD-10-CM | POA: Diagnosis not present

## 2019-10-18 DIAGNOSIS — I2510 Atherosclerotic heart disease of native coronary artery without angina pectoris: Secondary | ICD-10-CM | POA: Diagnosis present

## 2019-10-18 DIAGNOSIS — I6521 Occlusion and stenosis of right carotid artery: Secondary | ICD-10-CM | POA: Diagnosis present

## 2019-10-18 DIAGNOSIS — Z8249 Family history of ischemic heart disease and other diseases of the circulatory system: Secondary | ICD-10-CM | POA: Diagnosis not present

## 2019-10-18 DIAGNOSIS — Z7902 Long term (current) use of antithrombotics/antiplatelets: Secondary | ICD-10-CM | POA: Diagnosis not present

## 2019-10-18 DIAGNOSIS — M109 Gout, unspecified: Secondary | ICD-10-CM | POA: Diagnosis not present

## 2019-10-18 DIAGNOSIS — Z79899 Other long term (current) drug therapy: Secondary | ICD-10-CM

## 2019-10-18 DIAGNOSIS — I6529 Occlusion and stenosis of unspecified carotid artery: Secondary | ICD-10-CM | POA: Diagnosis present

## 2019-10-18 DIAGNOSIS — E785 Hyperlipidemia, unspecified: Secondary | ICD-10-CM | POA: Diagnosis not present

## 2019-10-18 DIAGNOSIS — I10 Essential (primary) hypertension: Secondary | ICD-10-CM | POA: Diagnosis present

## 2019-10-18 HISTORY — PX: PATCH ANGIOPLASTY: SHX6230

## 2019-10-18 HISTORY — PX: ENDARTERECTOMY: SHX5162

## 2019-10-18 LAB — CBC
HCT: 40.3 % (ref 39.0–52.0)
Hemoglobin: 13.2 g/dL (ref 13.0–17.0)
MCH: 29.9 pg (ref 26.0–34.0)
MCHC: 32.8 g/dL (ref 30.0–36.0)
MCV: 91.2 fL (ref 80.0–100.0)
Platelets: 190 10*3/uL (ref 150–400)
RBC: 4.42 MIL/uL (ref 4.22–5.81)
RDW: 12.7 % (ref 11.5–15.5)
WBC: 11.4 10*3/uL — ABNORMAL HIGH (ref 4.0–10.5)
nRBC: 0 % (ref 0.0–0.2)

## 2019-10-18 LAB — CREATININE, SERUM
Creatinine, Ser: 0.91 mg/dL (ref 0.61–1.24)
GFR calc Af Amer: >60 mL/min (ref >60)
GFR calc non Af Amer: >60 mL/min (ref >60)

## 2019-10-18 LAB — POCT ACTIVATED CLOTTING TIME: Activated Clotting Time: 323 seconds

## 2019-10-18 SURGERY — ENDARTERECTOMY, CAROTID
Anesthesia: General | Site: Neck | Laterality: Right

## 2019-10-18 MED ORDER — PNEUMOCOCCAL VAC POLYVALENT 25 MCG/0.5ML IJ INJ
0.5000 mL | INJECTION | INTRAMUSCULAR | Status: AC
  Administered 2019-10-19: 09:00:00 0.5 mL via INTRAMUSCULAR
  Filled 2019-10-18: qty 0.5

## 2019-10-18 MED ORDER — BISACODYL 5 MG PO TBEC: 5.0000 mg | DELAYED_RELEASE_TABLET | Freq: Every day | ORAL | Status: DC | PRN

## 2019-10-18 MED ORDER — METOPROLOL TARTRATE 25 MG PO TABS
25.0000 mg | ORAL_TABLET | Freq: Two times a day (BID) | ORAL | Status: DC
  Administered 2019-10-18 – 2019-10-19 (×2): 25 mg via ORAL
  Filled 2019-10-18 (×2): qty 1

## 2019-10-18 MED ORDER — MEPERIDINE HCL 25 MG/ML IJ SOLN: 6.2500 mg | INTRAMUSCULAR | Status: DC | PRN

## 2019-10-18 MED ORDER — METOPROLOL TARTRATE 5 MG/5ML IV SOLN: 2.0000 mg | INTRAVENOUS | Status: DC | PRN

## 2019-10-18 MED ORDER — ALLOPURINOL 100 MG PO TABS
100.0000 mg | ORAL_TABLET | Freq: Every day | ORAL | Status: DC
  Administered 2019-10-19: 09:00:00 100 mg via ORAL
  Filled 2019-10-18: qty 1

## 2019-10-18 MED ORDER — CLOPIDOGREL BISULFATE 75 MG PO TABS
75.0000 mg | ORAL_TABLET | Freq: Every day | ORAL | Status: DC
  Administered 2019-10-19: 09:00:00 75 mg via ORAL
  Filled 2019-10-18: qty 1

## 2019-10-18 MED ORDER — DONEPEZIL HCL 5 MG PO TABS
10.0000 mg | ORAL_TABLET | Freq: Every day | ORAL | Status: DC
  Administered 2019-10-19: 10 mg via ORAL
  Filled 2019-10-18: qty 2

## 2019-10-18 MED ORDER — PROTAMINE SULFATE 10 MG/ML IV SOLN
INTRAVENOUS | Status: DC | PRN
  Administered 2019-10-18 (×2): 20 mg via INTRAVENOUS
  Administered 2019-10-18: 10 mg via INTRAVENOUS

## 2019-10-18 MED ORDER — ASPIRIN EC 81 MG PO TBEC
81.0000 mg | DELAYED_RELEASE_TABLET | Freq: Every day | ORAL | Status: DC
  Administered 2019-10-19: 81 mg via ORAL
  Filled 2019-10-18 (×2): qty 1

## 2019-10-18 MED ORDER — HEPARIN SODIUM (PORCINE) 1000 UNIT/ML IJ SOLN
INTRAMUSCULAR | Status: DC | PRN
  Administered 2019-10-18: 10000 [IU] via INTRAVENOUS

## 2019-10-18 MED ORDER — LABETALOL HCL 5 MG/ML IV SOLN
INTRAVENOUS | Status: DC | PRN
  Administered 2019-10-18: 5 mg via INTRAVENOUS

## 2019-10-18 MED ORDER — EPHEDRINE SULFATE 50 MG/ML IJ SOLN
INTRAMUSCULAR | Status: DC | PRN
  Administered 2019-10-18: 5 mg via INTRAVENOUS

## 2019-10-18 MED ORDER — FENTANYL CITRATE (PF) 100 MCG/2ML IJ SOLN
INTRAMUSCULAR | Status: AC
  Filled 2019-10-18: qty 2

## 2019-10-18 MED ORDER — MAGNESIUM SULFATE 2 GM/50ML IV SOLN: 2.0000 g | Freq: Every day | INTRAVENOUS | Status: DC | PRN

## 2019-10-18 MED ORDER — 0.9 % SODIUM CHLORIDE (POUR BTL) OPTIME
TOPICAL | Status: DC | PRN
  Administered 2019-10-18: 2000 mL

## 2019-10-18 MED ORDER — POTASSIUM CHLORIDE CRYS ER 20 MEQ PO TBCR: 20.0000 meq | EXTENDED_RELEASE_TABLET | Freq: Every day | ORAL | Status: DC | PRN

## 2019-10-18 MED ORDER — HYDROMORPHONE HCL 1 MG/ML IJ SOLN
0.5000 mg | INTRAMUSCULAR | Status: DC | PRN
  Administered 2019-10-18 (×2): 1 mg via INTRAVENOUS
  Filled 2019-10-18 (×2): qty 1

## 2019-10-18 MED ORDER — PROPOFOL 10 MG/ML IV BOLUS
INTRAVENOUS | Status: AC
  Filled 2019-10-18: qty 20

## 2019-10-18 MED ORDER — FENTANYL CITRATE (PF) 250 MCG/5ML IJ SOLN
INTRAMUSCULAR | Status: AC
  Filled 2019-10-18: qty 5

## 2019-10-18 MED ORDER — ONDANSETRON HCL 4 MG/2ML IJ SOLN: 4.0000 mg | Freq: Four times a day (QID) | INTRAMUSCULAR | Status: DC | PRN

## 2019-10-18 MED ORDER — DOCUSATE SODIUM 100 MG PO CAPS
100.0000 mg | ORAL_CAPSULE | Freq: Every day | ORAL | Status: DC
  Administered 2019-10-19: 09:00:00 100 mg via ORAL
  Filled 2019-10-18: qty 1

## 2019-10-18 MED ORDER — HYPROMELLOSE (GONIOSCOPIC) 2.5 % OP SOLN
1.0000 [drp] | Freq: Two times a day (BID) | OPHTHALMIC | Status: DC | PRN
  Filled 2019-10-18: qty 15

## 2019-10-18 MED ORDER — PHENYLEPHRINE HCL-NACL 10-0.9 MG/250ML-% IV SOLN
INTRAVENOUS | Status: DC | PRN
  Administered 2019-10-18: 25 ug/min via INTRAVENOUS

## 2019-10-18 MED ORDER — SODIUM CHLORIDE 0.9 % IV SOLN
INTRAVENOUS | Status: DC
  Administered 2019-10-19: 05:00:00 via INTRAVENOUS

## 2019-10-18 MED ORDER — HEMOSTATIC AGENTS (NO CHARGE) OPTIME
TOPICAL | Status: DC | PRN
  Administered 2019-10-18 (×2): 1 via TOPICAL

## 2019-10-18 MED ORDER — LIDOCAINE HCL (PF) 1 % IJ SOLN: INTRAMUSCULAR | Status: DC | PRN

## 2019-10-18 MED ORDER — SENNOSIDES-DOCUSATE SODIUM 8.6-50 MG PO TABS: 1.0000 | ORAL_TABLET | Freq: Every evening | ORAL | Status: DC | PRN

## 2019-10-18 MED ORDER — OXYCODONE HCL 5 MG PO TABS: 5.0000 mg | ORAL_TABLET | ORAL | Status: DC | PRN

## 2019-10-18 MED ORDER — LABETALOL HCL 5 MG/ML IV SOLN: 10.0000 mg | INTRAVENOUS | Status: DC | PRN

## 2019-10-18 MED ORDER — ROCURONIUM BROMIDE 50 MG/5ML IV SOSY
PREFILLED_SYRINGE | INTRAVENOUS | Status: DC | PRN
  Administered 2019-10-18: 70 mg via INTRAVENOUS

## 2019-10-18 MED ORDER — SODIUM CHLORIDE 0.9 % IV SOLN
INTRAVENOUS | Status: DC | PRN
  Administered 2019-10-18: 500 mL

## 2019-10-18 MED ORDER — SODIUM CHLORIDE 0.9 % IV SOLN
INTRAVENOUS | Status: DC
  Administered 2019-10-18: 07:00:00 via INTRAVENOUS

## 2019-10-18 MED ORDER — CHLORHEXIDINE GLUCONATE CLOTH 2 % EX PADS: 6.0000 | MEDICATED_PAD | Freq: Once | CUTANEOUS | Status: DC

## 2019-10-18 MED ORDER — ATORVASTATIN CALCIUM 80 MG PO TABS
80.0000 mg | ORAL_TABLET | Freq: Every day | ORAL | Status: DC
  Administered 2019-10-18: 18:00:00 80 mg via ORAL
  Filled 2019-10-18: qty 1

## 2019-10-18 MED ORDER — ACETAMINOPHEN 325 MG RE SUPP: 325.0000 mg | RECTAL | Status: DC | PRN

## 2019-10-18 MED ORDER — ACETAMINOPHEN 325 MG PO TABS
325.0000 mg | ORAL_TABLET | ORAL | Status: DC | PRN
  Administered 2019-10-18: 23:00:00 325 mg via ORAL
  Filled 2019-10-18: qty 1

## 2019-10-18 MED ORDER — SUGAMMADEX SODIUM 500 MG/5ML IV SOLN
INTRAVENOUS | Status: DC | PRN
  Administered 2019-10-18: 240 mg via INTRAVENOUS

## 2019-10-18 MED ORDER — PHENOL 1.4 % MT LIQD: 1.0000 | OROMUCOSAL | Status: DC | PRN

## 2019-10-18 MED ORDER — LISINOPRIL 2.5 MG PO TABS
2.5000 mg | ORAL_TABLET | Freq: Every day | ORAL | Status: DC
  Administered 2019-10-19: 09:00:00 2.5 mg via ORAL
  Filled 2019-10-18: qty 1

## 2019-10-18 MED ORDER — FENTANYL CITRATE (PF) 100 MCG/2ML IJ SOLN
25.0000 ug | INTRAMUSCULAR | Status: DC | PRN
  Administered 2019-10-18 (×2): 50 ug via INTRAVENOUS

## 2019-10-18 MED ORDER — GUAIFENESIN-DM 100-10 MG/5ML PO SYRP: 15.0000 mL | ORAL_SOLUTION | ORAL | Status: DC | PRN

## 2019-10-18 MED ORDER — LIDOCAINE 2% (20 MG/ML) 5 ML SYRINGE
INTRAMUSCULAR | Status: DC | PRN
  Administered 2019-10-18: 100 mg via INTRAVENOUS

## 2019-10-18 MED ORDER — CEFAZOLIN SODIUM-DEXTROSE 2-4 GM/100ML-% IV SOLN
INTRAVENOUS | Status: AC
  Filled 2019-10-18: qty 100

## 2019-10-18 MED ORDER — SODIUM CHLORIDE 0.9 % IV SOLN: 500.0000 mL | Freq: Once | INTRAVENOUS | Status: DC | PRN

## 2019-10-18 MED ORDER — GLYCOPYRROLATE PF 0.2 MG/ML IJ SOSY: PREFILLED_SYRINGE | INTRAMUSCULAR | Status: DC | PRN

## 2019-10-18 MED ORDER — FENTANYL CITRATE (PF) 100 MCG/2ML IJ SOLN
INTRAMUSCULAR | Status: DC | PRN
  Administered 2019-10-18: 50 ug via INTRAVENOUS
  Administered 2019-10-18: 100 ug via INTRAVENOUS
  Administered 2019-10-18: 50 ug via INTRAVENOUS

## 2019-10-18 MED ORDER — HYDRALAZINE HCL 20 MG/ML IJ SOLN: 5.0000 mg | INTRAMUSCULAR | Status: DC | PRN

## 2019-10-18 MED ORDER — CEFAZOLIN SODIUM-DEXTROSE 2-4 GM/100ML-% IV SOLN
2.0000 g | INTRAVENOUS | Status: AC
  Administered 2019-10-18: 2 g via INTRAVENOUS

## 2019-10-18 MED ORDER — PEG 3350-KCL-NA BICARB-NACL 420 G PO SOLR: 4000.0000 mL | ORAL | Status: DC

## 2019-10-18 MED ORDER — ALUM & MAG HYDROXIDE-SIMETH 200-200-20 MG/5ML PO SUSP: 15.0000 mL | ORAL | Status: DC | PRN

## 2019-10-18 MED ORDER — ONDANSETRON HCL 4 MG/2ML IJ SOLN
INTRAMUSCULAR | Status: DC | PRN
  Administered 2019-10-18: 4 mg via INTRAVENOUS

## 2019-10-18 MED ORDER — PHENYLEPHRINE HCL (PRESSORS) 10 MG/ML IV SOLN
INTRAVENOUS | Status: DC | PRN
  Administered 2019-10-18 (×8): 40 ug via INTRAVENOUS

## 2019-10-18 MED ORDER — ESMOLOL HCL 100 MG/10ML IV SOLN
INTRAVENOUS | Status: DC | PRN
  Administered 2019-10-18 (×4): 10 mg via INTRAVENOUS

## 2019-10-18 MED ORDER — HEPARIN SODIUM (PORCINE) 5000 UNIT/ML IJ SOLN: 5000.0000 [IU] | Freq: Three times a day (TID) | INTRAMUSCULAR | Status: DC

## 2019-10-18 MED ORDER — ONDANSETRON HCL 4 MG/2ML IJ SOLN: 4.0000 mg | Freq: Once | INTRAMUSCULAR | Status: DC | PRN

## 2019-10-18 MED ORDER — PROPOFOL 10 MG/ML IV BOLUS
INTRAVENOUS | Status: DC | PRN
  Administered 2019-10-18: 150 mg via INTRAVENOUS

## 2019-10-18 MED ORDER — PANTOPRAZOLE SODIUM 40 MG PO TBEC
40.0000 mg | DELAYED_RELEASE_TABLET | Freq: Every day | ORAL | Status: DC
  Administered 2019-10-18 – 2019-10-19 (×2): 40 mg via ORAL
  Filled 2019-10-18 (×2): qty 1

## 2019-10-18 MED ORDER — CEFAZOLIN SODIUM-DEXTROSE 2-4 GM/100ML-% IV SOLN
2.0000 g | Freq: Three times a day (TID) | INTRAVENOUS | Status: AC
  Administered 2019-10-18 – 2019-10-19 (×2): 2 g via INTRAVENOUS
  Filled 2019-10-18 (×2): qty 100

## 2019-10-18 SURGICAL SUPPLY — 58 items
ADH SKN CLS APL DERMABOND .7 (GAUZE/BANDAGES/DRESSINGS) ×1
ADPR TBG 2 MALE LL ART (MISCELLANEOUS)
CANISTER SUCT 3000ML PPV (MISCELLANEOUS) ×3 IMPLANT
CATH ROBINSON RED A/P 18FR (CATHETERS) ×3 IMPLANT
CLIP VESOCCLUDE MED 24/CT (CLIP) ×3 IMPLANT
CLIP VESOCCLUDE SM WIDE 24/CT (CLIP) ×3 IMPLANT
COVER PROBE W GEL 5X96 (DRAPES) ×2 IMPLANT
COVER TRANSDUCER ULTRASND GEL (DRAPE) ×1 IMPLANT
COVER WAND RF STERILE (DRAPES) ×1 IMPLANT
DERMABOND ADVANCED (GAUZE/BANDAGES/DRESSINGS) ×2
DERMABOND ADVANCED .7 DNX12 (GAUZE/BANDAGES/DRESSINGS) ×1 IMPLANT
DRAIN CHANNEL 15F RND FF W/TCR (WOUND CARE) IMPLANT
ELECT REM PT RETURN 9FT ADLT (ELECTROSURGICAL) ×3
ELECTRODE REM PT RTRN 9FT ADLT (ELECTROSURGICAL) ×1 IMPLANT
EVACUATOR SILICONE 100CC (DRAIN) IMPLANT
GLOVE BIO SURGEON STRL SZ 6.5 (GLOVE) ×1 IMPLANT
GLOVE BIO SURGEON STRL SZ7.5 (GLOVE) ×3 IMPLANT
GLOVE BIO SURGEONS STRL SZ 6.5 (GLOVE) ×1
GLOVE BIOGEL PI IND STRL 8 (GLOVE) ×1 IMPLANT
GLOVE BIOGEL PI INDICATOR 8 (GLOVE) ×2
GOWN STRL REUS W/ TWL LRG LVL3 (GOWN DISPOSABLE) ×2 IMPLANT
GOWN STRL REUS W/ TWL XL LVL3 (GOWN DISPOSABLE) ×2 IMPLANT
GOWN STRL REUS W/TWL LRG LVL3 (GOWN DISPOSABLE) ×12
GOWN STRL REUS W/TWL XL LVL3 (GOWN DISPOSABLE) ×6
HEMOSTAT SNOW SURGICEL 2X4 (HEMOSTASIS) ×4 IMPLANT
IV ADAPTER SYR DOUBLE MALE LL (MISCELLANEOUS) IMPLANT
KIT BASIN OR (CUSTOM PROCEDURE TRAY) ×3 IMPLANT
KIT SHUNT ARGYLE CAROTID ART 6 (VASCULAR PRODUCTS) IMPLANT
KIT TURNOVER KIT B (KITS) ×3 IMPLANT
LOOP VESSEL MINI RED (MISCELLANEOUS) ×2 IMPLANT
NDL HYPO 25GX1X1/2 BEV (NEEDLE) IMPLANT
NDL SPNL 20GX3.5 QUINCKE YW (NEEDLE) IMPLANT
NEEDLE HYPO 25GX1X1/2 BEV (NEEDLE) ×3 IMPLANT
NEEDLE SPNL 20GX3.5 QUINCKE YW (NEEDLE) IMPLANT
NS IRRIG 1000ML POUR BTL (IV SOLUTION) ×9 IMPLANT
PACK CAROTID (CUSTOM PROCEDURE TRAY) ×3 IMPLANT
PAD ARMBOARD 7.5X6 YLW CONV (MISCELLANEOUS) ×6 IMPLANT
PATCH VASC XENOSURE 1CMX6CM (Vascular Products) ×3 IMPLANT
PATCH VASC XENOSURE 1X6 (Vascular Products) IMPLANT
POSITIONER HEAD DONUT 9IN (MISCELLANEOUS) ×3 IMPLANT
SHUNT CAROTID BYPASS 10 (VASCULAR PRODUCTS) ×2 IMPLANT
SHUNT CAROTID BYPASS 12FRX15.5 (VASCULAR PRODUCTS) IMPLANT
SPONGE SURGIFOAM ABS GEL 100 (HEMOSTASIS) IMPLANT
STOPCOCK 4 WAY LG BORE MALE ST (IV SETS) IMPLANT
SUT ETHILON 3 0 PS 1 (SUTURE) IMPLANT
SUT MNCRL AB 4-0 PS2 18 (SUTURE) ×3 IMPLANT
SUT PROLENE 5 0 C 1 24 (SUTURE) ×3 IMPLANT
SUT PROLENE 6 0 BV (SUTURE) ×5 IMPLANT
SUT SILK 3 0 (SUTURE)
SUT SILK 3-0 18XBRD TIE 12 (SUTURE) IMPLANT
SUT VIC AB 2-0 CT1 27 (SUTURE) ×9
SUT VIC AB 2-0 CT1 TAPERPNT 27 (SUTURE) ×1 IMPLANT
SUT VIC AB 3-0 SH 27 (SUTURE) ×3
SUT VIC AB 3-0 SH 27X BRD (SUTURE) ×1 IMPLANT
SYR CONTROL 10ML LL (SYRINGE) ×2 IMPLANT
TOWEL GREEN STERILE (TOWEL DISPOSABLE) ×3 IMPLANT
TUBING ART PRESS 48 MALE/FEM (TUBING) IMPLANT
WATER STERILE IRR 1000ML POUR (IV SOLUTION) ×3 IMPLANT

## 2019-10-18 NOTE — Progress Notes (Signed)
    Right neck incision healing well without hematoma No tongue deviation and smile is symmetrical  S/p right CEA Stable disposition

## 2019-10-18 NOTE — Anesthesia Procedure Notes (Signed)
Procedure Name: Intubation Date/Time: 10/18/2019 7:48 AM Performed by: Neldon Newport, CRNA Pre-anesthesia Checklist: Timeout performed, Patient being monitored, Suction available, Emergency Drugs available and Patient identified Patient Re-evaluated:Patient Re-evaluated prior to induction Oxygen Delivery Method: Circle system utilized Preoxygenation: Pre-oxygenation with 100% oxygen Induction Type: IV induction Ventilation: Mask ventilation without difficulty Laryngoscope Size: Mac and 4 Grade View: Grade II Tube type: Oral Tube size: 7.5 mm Number of attempts: 1 Placement Confirmation: breath sounds checked- equal and bilateral,  positive ETCO2 and ETT inserted through vocal cords under direct vision Secured at: 23 cm Tube secured with: Tape Dental Injury: Teeth and Oropharynx as per pre-operative assessment

## 2019-10-18 NOTE — Discharge Instructions (Signed)
   [facility]  Discharge Instructions   Carotid Endarterectomy (CEA)  Please refer to the following instructions for your post-procedure care. Your surgeon or physician assistant will discuss any changes with you.  Activity  You are encouraged to walk as much as you can. You can slowly return to normal activities but must avoid strenuous activity and heavy lifting until your doctor tell you it's OK. Avoid activities such as vacuuming or swinging a golf club. You can drive after one week if you are comfortable and you are no longer taking prescription pain medications. It is normal to feel tired for serval weeks after your surgery. It is also normal to have difficulty with sleep habits, eating, and bowel movements after surgery. These will go away with time.  Bathing/Showering  You may shower after you come home. Do not soak in a bathtub, hot tub, or swim until the incision heals completely.  Incision Care  Shower every day. Clean your incision with mild soap and water. Pat the area dry with a clean towel. You do not need a bandage unless otherwise instructed. Do not apply any ointments or creams to your incision. You may have skin glue on your incision. Do not peel it off. It will come off on its own in about one week. Your incision may feel thickened and raised for several weeks after your surgery. This is normal and the skin will soften over time. For Men Only: It's OK to shave around the incision but do not shave the incision itself for 2 weeks. It is common to have numbness under your chin that could last for several months.  Diet  Resume your normal diet. There are no special food restrictions following this procedure. A low fat/low cholesterol diet is recommended for all patients with vascular disease. In order to heal from your surgery, it is CRITICAL to get adequate nutrition. Your body requires vitamins, minerals, and protein. Vegetables are the best  source of vitamins and minerals. Vegetables also provide the perfect balance of protein. Processed food has little nutritional value, so try to avoid this.        Medications  Resume taking all of your medications unless your doctor or physician assistant tells you not to. If your incision is causing pain, you may take over-the- counter pain relievers such as acetaminophen (Tylenol). If you were prescribed a stronger pain medication, please be aware these medications can cause nausea and constipation. Prevent nausea by taking the medication with a snack or meal. Avoid constipation by drinking plenty of fluids and eating foods with a high amount of fiber, such as fruits, vegetables, and grains. Do not take Tylenol if you are taking prescription pain medications.  Follow Up  Our office will schedule a follow up appointment 2-3 weeks following discharge.  Please call us immediately for any of the following conditions  Increased pain, redness, drainage (pus) from your incision site. Fever of 101 degrees or higher. If you should develop stroke (slurred speech, difficulty swallowing, weakness on one side of your body, loss of vision) you should call 911 and go to the nearest emergency room.  Reduce your risk of vascular disease:  Stop smoking. If you would like help call [facility] at 1-800-QUIT-NOW (1-800-784-8669) or [facility] at 336-586-4000. Manage your cholesterol Maintain a desired weight Control your diabetes Keep your blood pressure down  If you have any questions, please call the office at [phone].   

## 2019-10-18 NOTE — Op Note (Signed)
OPERATIVE NOTE  PROCEDURE:   1.  right carotid endarterectomy with bovine patch angioplasty  PRE-OPERATIVE DIAGNOSIS: right asymptomatic high grade carotid stenosis (>80%)  POST-OPERATIVE DIAGNOSIS: same as above   SURGEON: Marty Heck, MD  ASSISTANT(S): Roxy Horseman, PA  ANESTHESIA: general  ESTIMATED BLOOD LOSS: 50 cc  FINDING(S): 1.  High grade right carotid plaque. 2.  Continuous Doppler audible flow signatures are appropriate for each carotid artery after endartectomy and patch angioplasty.  SPECIMEN(S):  Carotid plaque (sent to Pathology)  INDICATIONS:   Timothy Simpson is a 73 y.o. male who presents with right asymptomatic carotid stenosis >80%.  I discussed with the patient the risks, benefits, and alternatives to carotid endarterectomy.  I discussed the procedural details of carotid endarterectomy with the patient.  The patient is aware that the risks of carotid endarterectomy include but are not limited to: bleeding, infection, stroke, myocardial infarction, death, cranial nerve injuries both temporary and permanent, neck hematoma, possible airway compromise, labile blood pressure post-operatively, cerebral hyperperfusion syndrome, and possible need for additional interventions in the future. The patient is aware of the risks and agrees to proceed forward with the procedure.  DESCRIPTION: After full informed written consent was obtained from the patient, the patient was brought back to the operating room and placed supine upon the operating table.  Prior to induction, the patient received IV antibiotics.  After obtaining adequate anesthesia, the patient was placed into semi-Fowler position with a shoulder roll in place and the patient's neck slightly hyperextended and rotated away from the surgical site.  The patient was prepped in the standard fashion for a right carotid endarterectomy.  I made an incision anterior to the sternocleidomastoid muscle  and dissected down through the subcutaneous tissue.  The platysma was opened with electrocautery.  I then used Bovie cautery and blunt dissection to dissect through the underlying platysma and to mobilize the anterior border of the sternocleidomastoid as well as the internal jugular vein laterally.  The facial vein was ligated with 3-0 silk and surgical clips and divided.  After identifying the carotid artery I used Metzenbaum scissors to bluntly dissect the common carotid artery and then controlled this with both a large vessel loop and a umbilical tape.  At this point in time the patient was given 100 units/kg of IV heparin and we checked an ACT to ensure it was greater than 250.  I then carried my dissection cephalad and mobilized the external carotid artery and superior thyroid artery and controlled each of these with a vessel loop.  I then dissected out the internal carotid artery well past the distal plaque.  The internal carotid artery was then controlled with a umbilical tape as well. I was careful to identify the vagus nerve between the internal jugular and common carotid and this was presereved.  I was also careful to identify and preserve the hypoglossal nerve and this was preserved.    Once our ACT was confirmed, I proceeded by clamping the internal carotid artery with a angled bulldog clamp first.  The proximal common carotid artery was controlled with a angled debakey clamp.  The external carotid was controlled with a vessel loop.  I subsequently opened the common carotid artery with an 11 blade scalpel in longitudinal fashion and extended the arteriotomy with Potts scissors onto the ICA past the distal plaque.   I then used a Garment/textile technologist and performed a endarterectomy starting in the common carotid artery.  The external carotid artery  was endarterectomized with an eversion technique and I was careful to feather the distal ICA plaque.  The specimen was passed off the field. At this point a 10  [place] shunt was brought to the field and then initially placed distally into the ICA after removing the clamp and controlled with a umbilical tape. The shunt was back bleed with good flow.  I then placed the proximal end of the shunt in the common carotid artery and controlled this with a Rummel tourniquet. The endarterectomy site was then flushed with heparinized saline and I was careful to ensure there were no flaps in the endarterectomy site.  I then brought a bovine carotid patch on the field and this was sewn in place with a running anastomosis using a 6-0 Prolene distally and a 5-0 proximal.  The bovine patch was trimmed accordingly.  The shunt was removed just before completion of the patch.  The artery was flushed antegrade and retrograde prior to completion of the patch.  Once the patch was complete, I flushed up the external carotid artery first prior to releasing the internal carotid artery clamp.  An intraoperative doppler was performed with good flow signatures in the common, external, and internal carotid artery.   Once I was happy with the intraoperative doppler the patient was given protamine for reversal.  I used surgicel snow to get hemostasis around the patch.  Ultimately the platysma was closed in running fashion with 3-0 Vicryl.  The skin was closed with a running 4-0 Monocryl.  Dermabond was applied with a dry sterile dressing.  The patient was awakened from anesthesia with no new neurological deficit and taken to PACU in stable condition.    COMPLICATIONS: None  CONDITION: Stable  Marty Heck, MD [facility] Office: [phone] Pager: [phone]  10/18/2019, 10:01 AM

## 2019-10-18 NOTE — Anesthesia Procedure Notes (Signed)
Arterial Line Insertion Start/End11/16/2020 6:55 AM, 10/18/2019 7:20 AM Performed by: Verdie Drown, CRNA, CRNA  Patient location: Pre-op. Preanesthetic checklist: patient identified, IV checked, site marked, risks and benefits discussed, surgical consent, monitors and equipment checked, pre-op evaluation and timeout performed Lidocaine 1% used for infiltration Right, radial was placed Catheter size: 20 G Maximum sterile barriers used   Attempts: 3 Procedure performed without using ultrasound guided technique. Following insertion, dressing applied and Biopatch. Post procedure assessment: normal and unchanged  Patient tolerated the procedure well with no immediate complications. Additional procedure comments: First two attempts by Elige Ko, CRNA.

## 2019-10-18 NOTE — Transfer of Care (Signed)
Immediate Anesthesia Transfer of Care Note  Patient: Timothy Simpson  Procedure(s) Performed: ENDARTERECTOMY CAROTID (Right Neck) Patch Angioplasty (Right Neck)  Patient Location: PACU  Anesthesia Type:General  Level of Consciousness: awake, alert  and oriented  Airway & Oxygen Therapy: Patient Spontanous Breathing and Patient connected to nasal cannula oxygen  Post-op Assessment: Report given to RN, Post -op Vital signs reviewed and stable and Patient moving all extremities X 4  Post vital signs: Reviewed and stable  Last Vitals:  Vitals Value Taken Time  BP 128/78 10/18/19 1022  Temp    Pulse 76 10/18/19 1023  Resp 20 10/18/19 1023  SpO2 98 % 10/18/19 1023  Vitals shown include unvalidated device data.  Last Pain:  Vitals:   10/18/19 0621  TempSrc:   PainSc: 0-No pain         Complications: No apparent anesthesia complications

## 2019-10-18 NOTE — H&P (Signed)
History and Physical Interval Note:  10/18/2019 7:24 AM  Timothy Simpson  has presented today for surgery, with the diagnosis of RIGHT CAROTID STENOSIS.  The various methods of treatment have been discussed with the patient and family. After consideration of risks, benefits and other options for treatment, the patient has consented to  Procedure(s): ENDARTERECTOMY CAROTID (Right) as a surgical intervention.  The patient's history has been reviewed, patient examined, no change in status, stable for surgery.  I have reviewed the patient's chart and labs.  Questions were answered to the patient's satisfaction.    Right CEA.  Marty Heck  Patient name: Timothy Simpson  MRN: [id number]        DOB: [birth year]        Sex: male  REASON FOR CONSULT: Asymptomatic high-grade right carotid stenosis  HPI: Timothy Simpson is a 73 y.o. 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  Patient states his carotid disease was initially diagnosed last year when he had heart surgery.  He denies any previous history of TIAs or strokes.  No vision loss.  No weakness or numbness in arm or leg.  He still smoking at least 8 cigarettes a day.  He has recovered from his heart surgery without any issues.  States his cardiologist is Dr. Harl Bowie.  No previous head or neck surgery.  Takes aspirin and plavix daily.      Past Medical History:  Diagnosis Date  . Coronary artery disease   . Gout   . High cholesterol   . Hypertension   . NSTEMI (non-ST elevated myocardial infarction) ([facility]) 07/29/2018  . 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 Tobacco abuse          Past Surgical History:   Procedure Laterality Date  . BACK SURGERY    . COLONOSCOPY  2004   Dr. Gala Romney: Normal.  For history of tubulovillous adenoma back in 2001, five-year follow-up recommended.  . COLONOSCOPY  2009   Dr. Emerson Monte: 6 mm polyp in the ascending segment and one in the sigmoid colon removed.  Pathology showed tubular adenomas.  5-year surveillance colonoscopy recommended.  . COLONOSCOPY N/A 07/07/2019   Procedure: COLONOSCOPY;  Surgeon: Daneil Dolin, MD;  [facility];  Service: Endoscopy;  Laterality: N/A;  1:00pm  . CORONARY ARTERY BYPASS GRAFT N/A 08/05/2018   Procedure: CORONARY ARTERY BYPASS GRAFTING (CABG) x3. Endoscopic Saphenous vein harvest.;  Surgeon: Rexene Alberts, MD;  Location: [place] [facility];  Service: Open Heart Surgery;  Laterality: N/A;  . INTRAVASCULAR PRESSURE WIRE/FFR STUDY N/A 08/04/2018   Procedure: INTRAVASCULAR PRESSURE WIRE/FFR STUDY;  Surgeon: Burnell Blanks, MD;  Location: [facility];  Service: Cardiovascular;  Laterality: N/A;  . LEFT HEART CATH AND CORONARY ANGIOGRAPHY N/A 07/30/2018   Procedure: LEFT HEART CATH AND CORONARY ANGIOGRAPHY;  Surgeon: Jettie Booze, MD;  Location: [facility];  Service: Cardiovascular;  Laterality: N/A;  . POLYPECTOMY  07/07/2019   Procedure: POLYPECTOMY;  Surgeon: Daneil Dolin, MD;  [facility];  Service: Endoscopy;;  . TEE WITHOUT CARDIOVERSION N/A 08/05/2018   Procedure: TRANSESOPHAGEAL ECHOCARDIOGRAM (TEE);  Surgeon: Rexene Alberts, MD;  Location: [facility];  Service: Open  Heart Surgery;  Laterality: N/A;         Family History  Problem Relation Age of Onset  . Hypertension Father     SOCIAL HISTORY: Social History   Socioeconomic History  . Marital status: Married    Spouse name: Not on file  . Number of children: Not on file  . Years of education: Not on file  . Highest education level: Not on file  Occupational History  . Not on file  Social Needs  . Financial  resource strain: Not on file  . Food insecurity    Worry: Not on file    Inability: Not on file  . Transportation needs    Medical: Not on file    Non-medical: Not on file  Tobacco Use  . Smoking status: Current Every Day Smoker    Packs/day: 0.25    Years: 40.00    Pack years: 10.00    Types: Cigarettes  . Smokeless tobacco: Never Used  . Tobacco comment: he sated that he would quit if he was told he had to.   Substance and Sexual Activity  . Alcohol use: No  . Drug use: No  . Sexual activity: Not on file  Lifestyle  . Physical activity    Days per week: Not on file    Minutes per session: Not on file  . Stress: Not on file  Relationships  . Social Herbalist on phone: Not on file    Gets together: Not on file    Attends religious service: Not on file    Active member of club or organization: Not on file    Attends meetings of clubs or organizations: Not on file    Relationship status: Not on file  . Intimate partner violence    Fear of current or ex partner: Not on file    Emotionally abused: Not on file    Physically abused: Not on file    Forced sexual activity: Not on file  Other Topics Concern  . Not on file  Social History Narrative  . Not on file    No Known Allergies        Current Outpatient Medications  Medication Sig Dispense Refill  . allopurinol (ZYLOPRIM) 100 MG tablet Take 100 mg by mouth daily.    Marland Kitchen aspirin EC 81 MG EC tablet Take 1 tablet (81 mg total) by mouth daily.    Marland Kitchen atorvastatin (LIPITOR) 80 MG tablet Take 1 tablet (80 mg total) by mouth daily at 6 PM. 30 tablet 1  . cetirizine (ZYRTEC) 10 MG tablet Take 10 mg by mouth daily as needed for allergies.    Marland Kitchen clopidogrel (PLAVIX) 75 MG tablet Take 1 tablet (75 mg total) by mouth daily. 30 tablet 1  . hydroxypropyl methylcellulose / hypromellose (ISOPTO TEARS / GONIOVISC) 2.5 % ophthalmic solution Place 1 drop into both eyes 3 (three)  times daily as needed for dry eyes.    . metoprolol tartrate (LOPRESSOR) 25 MG tablet Take 1 tablet (25 mg total) by mouth 2 (two) times daily. 60 tablet 3  . oxymetazoline (AFRIN) 0.05 % nasal spray Place 1 spray into both nostrils 2 (two) times daily as needed for congestion.    . polyethylene glycol-electrolytes (TRILYTE) 420 g solution Take 4,000 mLs by mouth as directed. 4000 mL 0  . ferrous sulfate 325 (65 FE) MG EC tablet Take 1 tablet (325 mg total) by mouth 2 (two) times daily. 60 tablet 3  .  lisinopril (ZESTRIL) 2.5 MG tablet Take 1 tablet (2.5 mg total) by mouth daily. 90 tablet 3   No current facility-administered medications for this visit.     REVIEW OF SYSTEMS:  [X]  denotes positive finding, [ ]  denotes negative finding Cardiac  Comments:  Chest pain or chest pressure:    Shortness of breath upon exertion:    Short of breath when lying flat:    Irregular heart rhythm:        Vascular    Pain in calf, thigh, or hip brought on by ambulation:    Pain in feet at night that wakes you up from your sleep:     Blood clot in your veins:    Leg swelling:         Pulmonary    Oxygen at home:    Productive cough:     Wheezing:         Neurologic    Sudden weakness in arms or legs:     Sudden numbness in arms or legs:     Sudden onset of difficulty speaking or slurred speech:    Temporary loss of vision in one eye:     Problems with dizziness:         Gastrointestinal    Blood in stool:     Vomited blood:         Genitourinary    Burning when urinating:     Blood in urine:        Psychiatric    Major depression:         Hematologic    Bleeding problems:    Problems with blood clotting too easily:        Skin    Rashes or ulcers:        Constitutional    Fever or chills:      PHYSICAL EXAM:      Vitals:   09/28/19 1120 09/28/19 1125 09/28/19 1126   BP: (!) 183/101 (!) 158/88 (!) 167/86  Pulse: 64 64 64  Resp: 18    Temp: (!) 97.3 F (36.3 C)    TempSrc: Temporal    SpO2: 99%    Weight: 208 lb (94.3 kg)    Height: 6' (1.829 m)      GENERAL: The patient is a well-nourished male, in no acute distress. The vital signs are documented above. CARDIAC: There is a regular rate and rhythm.  Vascular: Weak right radial pulse, strong left radial pulse PULMONARY: There is good air exchange bilaterally without wheezing or rales. ABDOMEN: Soft and non-tender with normal pitched bowel sounds.  MUSCULOSKELETAL: There are no major deformities or cyanosis. NEUROLOGIC: No focal weakness or paresthesias are detected.  Cranial nerves II through XII grossly intact. SKIN: There are no ulcers or rashes noted. PSYCHIATRIC: The patient has a normal affect.  DATA:   Carotid duplex shows a greater than 80% right ICA stenosis with a velocity of 384/121.  No significant left carotid disease.  Assessment/Plan:   73 y.o. male, with history of hypertension hyperlipidemia tobacco abuse as well as coronary artery disease with history of NSTEMI and status post three-vessel CABG last year 2019 that presents for evaluation of asymptomatic high-grade right ICA stenosis.    I have recommended right carotid endarterectomy given greater than 80% stenosis.  Discussed goal of surgery is stroke risk reduction.   I discussed the details of the surgery with him and his wife today.  Risk benefits were discussed including risk of cranial  nerve injury, bleeding requiring return to the OR, 1% risk of perioperative stroke, risk of anesthesia etc.  He wants to delay surgery untill after Thanksgiving which I told him would be okay given this has been an asymptomatic lesion that was identified initially last year.  We will get clearance with Dr. Harl Bowie (his cardiologist) to ensure that he is safe from an anesthetic standpoint.   Marty Heck,  MD [facility] Office: [phone] Pager: [place]

## 2019-10-18 NOTE — Anesthesia Postprocedure Evaluation (Signed)
Anesthesia Post Note  Patient: Timothy Simpson  Procedure(s) Performed: ENDARTERECTOMY CAROTID (Right Neck) Patch Angioplasty (Right Neck)     Patient location during evaluation: PACU Anesthesia Type: General Level of consciousness: sedated and patient cooperative Pain management: pain level controlled Vital Signs Assessment: post-procedure vital signs reviewed and stable Respiratory status: spontaneous breathing Cardiovascular status: stable Anesthetic complications: no    Last Vitals:  Vitals:   10/18/19 1300 10/18/19 1400  BP: 125/83 120/74  Pulse: 67 76  Resp: 20 17  Temp:    SpO2: 98% 93%    Last Pain:  Vitals:   10/18/19 1310  TempSrc:   PainSc: 10-Worst pain ever                 Nolon Nations

## 2019-10-19 ENCOUNTER — Encounter (HOSPITAL_COMMUNITY): Payer: Self-pay | Admitting: Vascular Surgery

## 2019-10-19 LAB — BASIC METABOLIC PANEL
Anion gap: 10 (ref 5–15)
BUN: 9 mg/dL (ref 8–23)
CO2: 25 mmol/L (ref 22–32)
Calcium: 8.4 mg/dL — ABNORMAL LOW (ref 8.9–10.3)
Chloride: 105 mmol/L (ref 98–111)
Creatinine, Ser: 1.01 mg/dL (ref 0.61–1.24)
GFR calc Af Amer: >60 mL/min (ref >60)
GFR calc non Af Amer: >60 mL/min (ref >60)
Glucose, Bld: 126 mg/dL — ABNORMAL HIGH (ref 70–99)
Potassium: 3.7 mmol/L (ref 3.5–5.1)
Sodium: 140 mmol/L (ref 135–145)

## 2019-10-19 LAB — CBC WITH DIFFERENTIAL/PLATELET
Abs Immature Granulocytes: 0.03 10*3/uL (ref 0.00–0.07)
Basophils Absolute: 0 10*3/uL (ref 0.0–0.1)
Basophils Relative: 0 %
Eosinophils Absolute: 0.3 10*3/uL (ref 0.0–0.5)
Eosinophils Relative: 3 %
HCT: 35.2 % — ABNORMAL LOW (ref 39.0–52.0)
Hemoglobin: 11.8 g/dL — ABNORMAL LOW (ref 13.0–17.0)
Immature Granulocytes: 0 %
Lymphocytes Relative: 12 %
Lymphs Abs: 1.1 10*3/uL (ref 0.7–4.0)
MCH: 29.7 pg (ref 26.0–34.0)
MCHC: 33.5 g/dL (ref 30.0–36.0)
MCV: 88.7 fL (ref 80.0–100.0)
Monocytes Absolute: 0.9 10*3/uL (ref 0.1–1.0)
Monocytes Relative: 10 %
Neutro Abs: 6.6 10*3/uL (ref 1.7–7.7)
Neutrophils Relative %: 75 %
Platelets: 181 10*3/uL (ref 150–400)
RBC: 3.97 MIL/uL — ABNORMAL LOW (ref 4.22–5.81)
RDW: 12.7 % (ref 11.5–15.5)
WBC: 8.8 10*3/uL (ref 4.0–10.5)
nRBC: 0 % (ref 0.0–0.2)

## 2019-10-19 MED ORDER — OXYCODONE HCL 5 MG PO TABS: 5.0000 mg | ORAL_TABLET | Freq: Four times a day (QID) | ORAL | 0 refills | Status: DC | PRN

## 2019-10-19 NOTE — Progress Notes (Signed)
A-line removed per order without complications at [id number]. Pressure dressing applied. Will continue to monitor.

## 2019-10-19 NOTE — Progress Notes (Signed)
Pt ambulated approximately 1000 ft in the hallway.  Pt tolerated well, VSS after ambulation.   Will continue to monitor.

## 2019-10-19 NOTE — Plan of Care (Signed)
DISCHARGE NOTE HOME Timothy Simpson to be discharged home per MD order. Discussed prescriptions and follow up appointments with the patient. Prescriptions given to patient; medication list explained in detail. Patient verbalized understanding.  Skin clean, dry and intact without evidence of skin break down, no evidence of skin tears noted. IV catheter discontinued intact. Site without signs and symptoms of complications. Dressing and pressure applied. Pt denies pain at the site currently. No complaints noted.  Patient free of lines, drains, and wounds.   An After Visit Summary (AVS) was printed and given to the patient. Patient escorted via wheelchair, and discharged home via private auto.  Stephan Minister, RN

## 2019-10-19 NOTE — Progress Notes (Addendum)
[facility]  Subjective  - Doing well without complaints.   Objective (!) 150/72 85 98.2 F (36.8 C) (Oral) (!) 22 95%  Intake/Output Summary (Last 24 hours) at 10/19/2019 0735 Last data filed at 10/19/2019 0439 Gross per 24 hour  Intake 3171.5 ml  Output 1050 ml  Net 2121.5 ml    Right neck incision healing well, without hematoma No tongue deviation smile is symmetric Grip 5/5 equal B  Lungs non labored breathing  Assessment/Planning: POD # 1 right CEA  Plan for discharge today in stable condition.  He has independently voided, ambulated and tolerating PO's. F/U with Dr. Carlis Abbott in 2-3 weeks.  Roxy Horseman 10/19/2019 7:35 AM --  Laboratory Lab Results: Recent Labs    10/18/19 1255 10/19/19 0440  WBC 11.4* 8.8  HGB 13.2 11.8*  HCT 40.3 35.2*  PLT 190 181   BMET Recent Labs    10/18/19 1045 10/19/19 0440  NA  --  140  K  --  3.7  CL  --  105  CO2  --  25  GLUCOSE  --  126*  BUN  --  9  CREATININE 0.91 1.01  CALCIUM  --  8.4*    COAG Lab Results  Component Value Date   INR 1.1 10/13/2019   INR 1.60 08/05/2018   INR 1.10 08/01/2018   No results found for: PTT  I have seen and evaluated the patient. I agree with the PA note as documented above. POD#1 s/p R CEA for asymptomatic high grade stenosis.  Neuro intact.  Neck looks good.  Plan for d/c home today.  F/U 3 weeks for wound check.    Marty Heck, MD [facility] Office: [phone] Pager: [phone]

## 2019-10-19 NOTE — Progress Notes (Signed)
Pt appeared alert and oriented x 4, neuro signs intact, no deficits. His vital signs stable. Remained afebrile.EKG Sinus rhythm on the monitor, HR 90s. BP cuff 95/71- 121/72 mmHg, around 20 points lower than A-line. No acute distress noted . Pian tolerated well, surgical wound clean and dry, negative for hematoma. Will continue to monitor.     10/18/19 2109  Glasgow Coma Scale  Eye Opening 4  Best Verbal Response (NON-intubated) 5  Best Motor Response 6  Glasgow Coma Scale Score 15  NIH Stroke Scale ( + Modified Stroke Scale Criteria)   Interval Shift assessment (neuro check q 2 hrs)  Level of Consciousness (1a.)    0  LOC Questions (1b. )   + 0  LOC Commands (1c. )   +  0  Best Gaze (2. )  + 0  Visual (3. )  + 0  Facial Palsy (4. )     0  Motor Arm, Left (5a. )   + 0  Motor Arm, Right (5b. )   + 0  Motor Leg, Left (6a. )   + 0  Motor Leg, Right (6b. )   + 0  Limb Ataxia (7. ) 0  Sensory (8. )   + 0  Best Language (9. )   + 0  Dysarthria (10. ) 0  Extinction/Inattention (11.)   + 0  Modified SS Total  + 0  Complete NIHSS TOTAL 0   Kennyth Lose, RN

## 2019-10-20 NOTE — Discharge Summary (Signed)
[facility] Discharge Summary   Patient ID:  Timothy Simpson MRN: [id number] DOB/AGE: 1946/02/21 73 y.o.  Admit date: 10/18/2019 Discharge date: 10/19/19 Date of Surgery: 10/18/2019 Surgeon: Surgeon(s): Marty Heck, MD  Admission Diagnosis: RIGHT CAROTID STENOSIS  Discharge Diagnoses:  RIGHT CAROTID STENOSIS  Secondary Diagnoses: Past Medical History:  Diagnosis Date  . Coronary artery disease   . Gout   . High cholesterol   . Hypertension   . NSTEMI (non-ST elevated myocardial infarction) ([place]) 07/29/2018  . 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 Tobacco abuse     Procedure(s): ENDARTERECTOMY CAROTID Patch Angioplasty  Discharged Condition: stable  HPI: Timothy Metter Weatherfordis a 73 y.o.male,with history of hypertension,hyperlipidemia,tobacco abuse,coronary artery disease with history of NSTEMI and status post three-vessel CABG last year in2019 thatpresents for evaluation of asymptomatic high-grade right carotid stenosis. Patient states his carotid disease was initially diagnosed last year when he had heart surgery. He denies any previous history ofTIAs or strokes.  Timothy Turner Weatherfordis a 73 y.o.male,with history of hypertension,hyperlipidemia,tobacco abuse,coronary artery disease with history of NSTEMI and status post three-vessel CABG last year in2019 thatpresents for evaluation of asymptomatic high-grade right carotid stenosis. Patient states his carotid disease was initially diagnosed last year when he had heart surgery. He denies any previous history ofTIAs or strokes.  He was scheduled for right CEA.   Hospital Course:  Timothy Simpson is a 73 y.o. male is S/P Right Procedure(s): ENDARTERECTOMY CAROTID Patch Angioplasty   Post op uneventful with no neurologic deficits.  Discharged home in stable  condition.  He will cont. Lipitor, Asa and Plavix.     Significant Diagnostic Studies: CBC Lab Results  Component Value Date   WBC 8.8 10/19/2019   HGB 11.8 (L) 10/19/2019   HCT 35.2 (L) 10/19/2019   MCV 88.7 10/19/2019   PLT 181 10/19/2019    BMET    Component Value Date/Time   NA 140 10/19/2019 0440   K 3.7 10/19/2019 0440   CL 105 10/19/2019 0440   CO2 25 10/19/2019 0440   GLUCOSE 126 (H) 10/19/2019 0440   BUN 9 10/19/2019 0440   CREATININE 1.01 10/19/2019 0440   CALCIUM 8.4 (L) 10/19/2019 0440   GFRNONAA >60 10/19/2019 0440   GFRAA >60 10/19/2019 0440   COAG Lab Results  Component Value Date   INR 1.1 10/13/2019   INR 1.60 08/05/2018   INR 1.10 08/01/2018     Disposition:  Discharge to :Home Discharge Instructions    Call MD for:  redness, tenderness, or signs of infection (pain, swelling, bleeding, redness, odor or green/yellow discharge around incision site)   Complete by: As directed    Call MD for:  severe or increased pain, loss or decreased feeling  in affected limb(s)   Complete by: As directed    Call MD for:  temperature >100.5   Complete by: As directed    Resume previous diet   Complete by: As directed      Allergies as of 10/19/2019   No Known Allergies     Medication List    TAKE these medications   allopurinol 100 MG tablet Commonly known as: ZYLOPRIM Take 100 mg by mouth daily.   aspirin 81 MG EC tablet Take 1 tablet (81 mg total) by mouth daily.   atorvastatin 80 MG tablet Commonly known as: LIPITOR Take 1 tablet (  80 mg total) by mouth daily at 6 PM.   cetirizine 10 MG tablet Commonly known as: ZYRTEC Take 10 mg by mouth daily as needed for allergies.   clopidogrel 75 MG tablet Commonly known as: PLAVIX Take 1 tablet (75 mg total) by mouth daily.   donepezil 10 MG tablet Commonly known as: ARICEPT Take 10 mg by mouth daily.   ferrous sulfate 325 (65 FE) MG EC tablet Take 1 tablet (325 mg total) by mouth 2 (two)  times daily.   hydroxypropyl methylcellulose / hypromellose 2.5 % ophthalmic solution Commonly known as: ISOPTO TEARS / GONIOVISC Place 1 drop into both eyes 2 (two) times daily as needed for dry eyes.   lisinopril 2.5 MG tablet Commonly known as: ZESTRIL Take 1 tablet (2.5 mg total) by mouth daily.   metoprolol tartrate 25 MG tablet Commonly known as: LOPRESSOR Take 1 tablet (25 mg total) by mouth 2 (two) times daily.   oxyCODONE 5 MG immediate release tablet Commonly known as: Oxy IR/ROXICODONE Take 1 tablet (5 mg total) by mouth every 6 (six) hours as needed for moderate pain.   oxymetazoline 0.05 % nasal spray Commonly known as: AFRIN Place 1 spray into both nostrils 2 (two) times daily as needed for congestion.   polyethylene glycol-electrolytes 420 g solution Commonly known as: TriLyte Take 4,000 mLs by mouth as directed.      Verbal and written Discharge instructions given to the patient. Wound care per Discharge AVS Follow-up Information    Marty Heck, MD In 2 weeks.   Specialty: Vascular Surgery Why: Office will call you to arrange your appt (sent) Contact information: [place] [address] [phone]           Signed: Roxy Horseman 10/20/2019, 8:48 AM --- For VQI Registry use --- Instructions: Press F2 to tab through selections.  Delete question if not applicable.   Modified Rankin score at D/C (0-6): Rankin Score=0  IV medication needed for:  1. Hypertension: No 2. Hypotension: No  Post-op Complications: No  1. Post-op CVA or TIA: No  If yes: Event classification (right eye, left eye, right cortical, left cortical, verterobasilar, other):   If yes: Timing of event (intra-op, <6 hrs post-op, >=6 hrs post-op, unknown):   2. CN injury: No  If yes: CN  injuried   3. Myocardial infarction: No  If yes: Dx by (EKG or clinical, Troponin):   4.  CHF: No  5.  Dysrhythmia (new): No  6. Wound infection: No  7.  Reperfusion symptoms: No  8. Return to OR: No  If yes: return to OR for (bleeding, neurologic, other CEA incision, other):   Discharge medications: Statin use:  Yes ASA use:  Yes Beta blocker use:  Yes ACE-Inhibitor use:  Yes P2Y12 Antagonist use: [ ]  None, [x ] Plavix, [ ]  Plasugrel, [ ]  Ticlopinine, [ ]  Ticagrelor, [ ]  Other, [ ]  No for medical reason, [ ]  Non-compliant, [ ]  Not-indicated Anti-coagulant use:  [x ] None, [ ]  Warfarin, [ ]  Rivaroxaban, [ ]  Dabigatran, [ ]  Other, [ ]  No for medical reason, [ ]  Non-compliant, [ ]  Not-indicated

## 2019-11-09 ENCOUNTER — Ambulatory Visit (INDEPENDENT_AMBULATORY_CARE_PROVIDER_SITE_OTHER): Payer: Self-pay | Admitting: Vascular Surgery

## 2019-11-09 ENCOUNTER — Other Ambulatory Visit: Payer: Self-pay

## 2019-11-09 ENCOUNTER — Encounter: Payer: Self-pay | Admitting: Vascular Surgery

## 2019-11-09 VITALS — BP 144/88 | HR 73 | Temp 97.8°F | Resp 18 | Ht 72.0 in | Wt 208.0 lb

## 2019-11-09 DIAGNOSIS — I6521 Occlusion and stenosis of right carotid artery: Secondary | ICD-10-CM

## 2019-11-09 NOTE — Progress Notes (Signed)
Patient name: Timothy Simpson MRN: [id number] DOB: 1946/09/17 Sex: male  REASON FOR VISIT: Postop check after right carotid endarterectomy  HPI: Timothy Simpson is a 73 y.o. male with history of CAD and NSTEMI, hypertension, hyperlipidemia that presents for postop check after right carotid endarterectomy on 10/18/2019 for asymptomatic high-grade stenosis.  He has done very well at home postoperatively.  There were no issues in the hospital.  His neck incision is well healed.  He had remained neurologically intact.   No specific concerns.  Past Medical History:  Diagnosis Date  . Coronary artery disease   . Gout   . High cholesterol   . Hypertension   . NSTEMI (non-ST elevated myocardial infarction) ([place]) 07/29/2018  . 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 Tobacco abuse     Past Surgical History:  Procedure Laterality Date  . BACK SURGERY    . COLONOSCOPY  2004   Dr. Gala Romney: Normal.  For history of tubulovillous adenoma back in 2001, five-year follow-up recommended.  . COLONOSCOPY  2009   Dr. Emerson Monte: 6 mm polyp in the ascending segment and one in the sigmoid colon removed.  Pathology showed tubular adenomas.  5-year surveillance colonoscopy recommended.  . COLONOSCOPY N/A 07/07/2019   Procedure: COLONOSCOPY;  Surgeon: Daneil Dolin, MD;  [facility];  Service: Endoscopy;  Laterality: N/A;  1:00pm  . CORONARY ARTERY BYPASS GRAFT N/A 08/05/2018   Procedure: CORONARY ARTERY BYPASS GRAFTING (CABG) x3. Endoscopic Saphenous vein harvest.;  Surgeon: Rexene Alberts, MD;  Location: [facility];  Service: Open Heart Surgery;  Laterality: N/A;  . ENDARTERECTOMY Right 10/18/2019   Procedure: ENDARTERECTOMY CAROTID;  Surgeon: Marty Heck, MD;  Location: [facility];  Service: Vascular;  Laterality: Right;  . INTRAVASCULAR PRESSURE WIRE/FFR STUDY N/A 08/04/2018   Procedure: INTRAVASCULAR PRESSURE WIRE/FFR STUDY;  Surgeon: Burnell Blanks, MD;  Location: [facility];  Service: Cardiovascular;  Laterality: N/A;  . LEFT HEART CATH AND CORONARY ANGIOGRAPHY N/A 07/30/2018   Procedure: LEFT HEART CATH AND CORONARY ANGIOGRAPHY;  Surgeon: Jettie Booze, MD;  Location: [facility];  Service: Cardiovascular;  Laterality: N/A;  . PATCH ANGIOPLASTY Right 10/18/2019   Procedure: Patch Angioplasty;  Surgeon: Marty Heck, MD;  Location: [facility];  Service: Vascular;  Laterality: Right;  . POLYPECTOMY  07/07/2019   Procedure: POLYPECTOMY;  Surgeon: Daneil Dolin, MD;  [facility];  Service: Endoscopy;;  . TEE WITHOUT CARDIOVERSION N/A 08/05/2018   Procedure: TRANSESOPHAGEAL ECHOCARDIOGRAM (TEE);  Surgeon: Rexene Alberts, MD;  Location: [facility];  Service: Open Heart Surgery;  Laterality: N/A;    Family History  Problem Relation Age of Onset  . Hypertension Father     SOCIAL HISTORY: Social History   Tobacco Use  . Smoking status: Current Every Day Smoker    Packs/day: 0.25    Years: 40.00    Pack years: 10.00    Types: Cigarettes  . Smokeless tobacco: Never Used  . Tobacco comment: he sated that he would quit if he was told he had to.   Substance Use Topics  . Alcohol use: No    No Known Allergies  Current Outpatient Medications  Medication Sig Dispense Refill  . allopurinol (ZYLOPRIM) 100 MG tablet Take 100 mg by mouth daily.    Marland Kitchen amLODipine (NORVASC) 5 MG tablet     .  aspirin EC 81 MG EC tablet Take 1 tablet (81 mg total) by mouth daily.    Marland Kitchen atorvastatin (LIPITOR) 80 MG tablet Take 1 tablet (80 mg total) by mouth daily at 6 PM. 30 tablet 1  . cetirizine (ZYRTEC) 10 MG tablet Take 10 mg by mouth daily as needed for allergies.    Marland Kitchen clopidogrel (PLAVIX) 75 MG tablet Take 1 tablet (75 mg total) by mouth daily. 30 tablet 1  . donepezil (ARICEPT) 10 MG tablet Take 10 mg by mouth daily.    . hydroxypropyl  methylcellulose / hypromellose (ISOPTO TEARS / GONIOVISC) 2.5 % ophthalmic solution Place 1 drop into both eyes 2 (two) times daily as needed for dry eyes.     . metoprolol tartrate (LOPRESSOR) 25 MG tablet Take 1 tablet (25 mg total) by mouth 2 (two) times daily. 60 tablet 3  . oxymetazoline (AFRIN) 0.05 % nasal spray Place 1 spray into both nostrils 2 (two) times daily as needed for congestion.    . polyethylene glycol-electrolytes (TRILYTE) 420 g solution Take 4,000 mLs by mouth as directed. 4000 mL 0  . ferrous sulfate 325 (65 FE) MG EC tablet Take 1 tablet (325 mg total) by mouth 2 (two) times daily. (Patient not taking: Reported on 10/07/2019) 60 tablet 3  . lisinopril (ZESTRIL) 2.5 MG tablet Take 1 tablet (2.5 mg total) by mouth daily. 90 tablet 3  . oxyCODONE (OXY IR/ROXICODONE) 5 MG immediate release tablet Take 1 tablet (5 mg total) by mouth every 6 (six) hours as needed for moderate pain. (Patient not taking: Reported on 11/09/2019) 8 tablet 0   No current facility-administered medications for this visit.     REVIEW OF SYSTEMS:  [X]  denotes positive finding, [ ]  denotes negative finding Cardiac  Comments:  Chest pain or chest pressure:    Shortness of breath upon exertion:    Short of breath when lying flat:    Irregular heart rhythm:        Vascular    Pain in calf, thigh, or hip brought on by ambulation:    Pain in feet at night that wakes you up from your sleep:     Blood clot in your veins:    Leg swelling:         Pulmonary    Oxygen at home:    Productive cough:     Wheezing:         Neurologic    Sudden weakness in arms or legs:     Sudden numbness in arms or legs:     Sudden onset of difficulty speaking or slurred speech:    Temporary loss of vision in one eye:     Problems with dizziness:         Gastrointestinal    Blood in stool:     Vomited blood:         Genitourinary    Burning when urinating:     Blood in urine:        Psychiatric    Major  depression:         Hematologic    Bleeding problems:    Problems with blood clotting too easily:        Skin    Rashes or ulcers:        Constitutional    Fever or chills:      PHYSICAL EXAM: Vitals:   11/09/19 1512 11/09/19 1515  BP: (!) 162/93 (!) 144/88  Pulse: 73 73  Resp:  18   Temp: 97.8 F (36.6 C)   TempSrc: Temporal   SpO2: 98%   Weight: 208 lb (94.3 kg)   Height: 6' (1.829 m)     GENERAL: The patient is a well-nourished male, in no acute distress. The vital signs are documented above. CARDIAC: There is a regular rate and rhythm.  VASCULAR:  Right neck incision well-healed.  No hematoma. NEUROLOGIC: No focal weakness or paresthesias are detected.  Cranial nerves II through XII grossly intact.  DATA:   None  Assessment/Plan:  73 year old male status post right carotid endarterectomy on 10/18/2019 for asymptomatic high-grade stenosis.  He has recovered well from surgery.  The neck incision is well-healed.  He is neurologically intact.  I will have him follow-up with me again in 9 months with carotid duplex for ongoing surveillance.  Discussed with him and his wife that he only needs aspirin from my standpoint moving forward and can stop the Plavix but will clarify with his cardiologist first since he was on Plavix at the time of initial referral.   Marty Heck, MD [facility] Office: [phone] Pager: [phone]

## 2019-11-30 ENCOUNTER — Other Ambulatory Visit: Payer: Self-pay | Admitting: *Deleted

## 2019-11-30 DIAGNOSIS — I6523 Occlusion and stenosis of bilateral carotid arteries: Secondary | ICD-10-CM

## 2019-12-13 ENCOUNTER — Other Ambulatory Visit: Payer: Self-pay | Admitting: Cardiology

## 2019-12-14 ENCOUNTER — Telehealth: Payer: Self-pay | Admitting: Cardiology

## 2019-12-14 NOTE — Telephone Encounter (Signed)

## 2019-12-15 ENCOUNTER — Encounter: Payer: Self-pay | Admitting: Student

## 2019-12-15 ENCOUNTER — Telehealth (INDEPENDENT_AMBULATORY_CARE_PROVIDER_SITE_OTHER): Payer: Medicare HMO | Admitting: Student

## 2019-12-15 VITALS — Ht 73.0 in

## 2019-12-15 DIAGNOSIS — I6521 Occlusion and stenosis of right carotid artery: Secondary | ICD-10-CM

## 2019-12-15 DIAGNOSIS — I2510 Atherosclerotic heart disease of native coronary artery without angina pectoris: Secondary | ICD-10-CM

## 2019-12-15 DIAGNOSIS — E785 Hyperlipidemia, unspecified: Secondary | ICD-10-CM

## 2019-12-15 DIAGNOSIS — I10 Essential (primary) hypertension: Secondary | ICD-10-CM

## 2019-12-15 NOTE — Progress Notes (Signed)
Virtual Visit via Telephone Note   This visit type was conducted due to national recommendations for restrictions regarding the COVID-19 Pandemic (e.g. social distancing) in an effort to limit this patient's exposure and mitigate transmission in our community.  Due to his co-morbid illnesses, this patient is at least at moderate risk for complications without adequate follow up.  This format is felt to be most appropriate for this patient at this time.  The patient did not have access to video technology/had technical difficulties with video requiring transitioning to audio format only (telephone).  All issues noted in this document were discussed and addressed.  No physical exam could be performed with this format.  Please refer to the patient's chart for his  consent to telehealth for [facility]   Date:  12/15/2019   ID:  Timothy Simpson, DOB 01-25-1946, MRN [id number]  Patient Location: Home Provider Location: Office  PCP:  Jani Gravel, MD  Cardiologist:  Carlyle Dolly, MD  Electrophysiologist:  None   Evaluation Performed:  Follow-Up Visit  Chief Complaint: Variable BP  History of Present Illness:    Timothy Simpson is a 74 y.o. male with past medical history of CAD (s/p CABG in 08/2018 with LIMA-LAD and seq SVG to medial and lateral branches of RI), HTN, HLD, carotid artery stenosis and tobacco use who presents for a 73-month follow-up telehealth visit.   He was last examined by Dr. Harl Bowie in 06/2019 and denied any recent chest pain or dyspnea on exertion. It was recommended to stop Plavix in 08/2019 given 1 year out from his presentation for an NSTEMI. He was started on Lisinopril 2.5mg  daily for CV benefit. Repeat carotid dopplers in 08/2019 showed severe 70-99% RICA stenosis and he was referred to Vascular Surgery for further evaluation. He ultimately underwent right CEA with bovine patch angioplasty in 10/2019 by Dr. Carlis Abbott. Post-operative course was uncomplicated  and he was continued on his current medication regimen at the time of discharge. He did follow-up with Dr. Carlis Abbott in 11/2019 and it was mentioned Plavix could be discontinued from his perspective if fine by Cardiology.   In talking with the patient today, he reports overall doing well since his last office visit. He denies any complications from his recent CEA in 10/2019. He does report some scarring along the surgical site but denies any redness or drainage. His wife reports he was evaluated by his PCP several weeks ago and was started on Amlodipine and HCTZ but he did not start these medications as she was concerned they might lower his blood pressure too much. She is unable to recall his specific BP at the time of the visit and they do not have a cuff currently available at home.  He denies any recent dyspnea on exertion, chest pain, palpitations, orthopnea, PND or lower extremity edema.  The patient does not have symptoms concerning for COVID-19 infection (fever, chills, cough, or new shortness of breath).    Past Medical History:  Diagnosis Date  . Coronary artery disease   . Gout   . High cholesterol   . Hypertension   . NSTEMI (non-ST elevated myocardial infarction) ([place]) 07/29/2018  . 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 Tobacco abuse    Past Surgical History:  Procedure Laterality Date  . BACK SURGERY    . COLONOSCOPY  2004   Dr. Gala Romney: Normal.  For history of tubulovillous adenoma back in 2001, five-year follow-up recommended.  . COLONOSCOPY  2009   Dr. Emerson Monte: 6 mm polyp in the ascending segment and one in the sigmoid colon removed.  Pathology showed tubular adenomas.  5-year surveillance colonoscopy recommended.  . COLONOSCOPY N/A 07/07/2019   Procedure: COLONOSCOPY;  Surgeon: Daneil Dolin, MD;  [facility];  Service: Endoscopy;  Laterality: N/A;  1:00pm   . CORONARY ARTERY BYPASS GRAFT N/A 08/05/2018   Procedure: CORONARY ARTERY BYPASS GRAFTING (CABG) x3. Endoscopic Saphenous vein harvest.;  Surgeon: Rexene Alberts, MD;  Location: [place];  Service: Open Heart Surgery;  Laterality: N/A;  . ENDARTERECTOMY Right 10/18/2019   Procedure: ENDARTERECTOMY CAROTID;  Surgeon: Marty Heck, MD;  Location: [place];  Service: Vascular;  Laterality: Right;  . INTRAVASCULAR PRESSURE WIRE/FFR STUDY N/A 08/04/2018   Procedure: INTRAVASCULAR PRESSURE WIRE/FFR STUDY;  Surgeon: Burnell Blanks, MD;  Location: [place] [facility];  Service: Cardiovascular;  Laterality: N/A;  . LEFT HEART CATH AND CORONARY ANGIOGRAPHY N/A 07/30/2018   Procedure: LEFT HEART CATH AND CORONARY ANGIOGRAPHY;  Surgeon: Jettie Booze, MD;  Location: [place] [facility];  Service: Cardiovascular;  Laterality: N/A;  . PATCH ANGIOPLASTY Right 10/18/2019   Procedure: Patch Angioplasty;  Surgeon: Marty Heck, MD;  Location: [place];  Service: Vascular;  Laterality: Right;  . POLYPECTOMY  07/07/2019   Procedure: POLYPECTOMY;  Surgeon: Daneil Dolin, MD;  [facility];  Service: Endoscopy;;  . TEE WITHOUT CARDIOVERSION N/A 08/05/2018   Procedure: TRANSESOPHAGEAL ECHOCARDIOGRAM (TEE);  Surgeon: Rexene Alberts, MD;  Location: [place];  Service: Open Heart Surgery;  Laterality: N/A;     Current Meds  Medication Sig  . allopurinol (ZYLOPRIM) 100 MG tablet Take 100 mg by mouth daily.  Marland Kitchen aspirin EC 81 MG EC tablet Take 1 tablet (81 mg total) by mouth daily.  Marland Kitchen atorvastatin (LIPITOR) 80 MG tablet Take 1 tablet (80 mg total) by mouth daily at 6 PM.  . cetirizine (ZYRTEC) 10 MG tablet Take 10 mg by mouth daily as needed for allergies.  Marland Kitchen donepezil (ARICEPT) 10 MG tablet Take 10 mg by mouth daily.  . ferrous sulfate 325 (65 FE) MG EC tablet Take 1 tablet (325 mg total) by mouth 2 (two) times daily.  . hydroxypropyl methylcellulose / hypromellose (ISOPTO TEARS / GONIOVISC)  2.5 % ophthalmic solution Place 1 drop into both eyes 2 (two) times daily as needed for dry eyes.   Marland Kitchen lisinopril (ZESTRIL) 2.5 MG tablet TAKE 1 TABLET BY MOUTH ONCE DAILY.  . metoprolol tartrate (LOPRESSOR) 25 MG tablet Take 1 tablet (25 mg total) by mouth 2 (two) times daily.  Marland Kitchen oxymetazoline (AFRIN) 0.05 % nasal spray Place 1 spray into both nostrils 2 (two) times daily as needed for congestion.  . [DISCONTINUED] clopidogrel (PLAVIX) 75 MG tablet Take 1 tablet (75 mg total) by mouth daily.     Allergies:   Patient has no known allergies.   Social History   Tobacco Use  . Smoking status: Former Smoker    Packs/day: 0.25    Years: 40.00    Pack years: 10.00    Types: Cigarettes    Quit date: 11/01/2019    Years since quitting: 0.1  . Smokeless tobacco: Never Used  . Tobacco comment: he sated that he would quit if he was told he had to.   Substance Use Topics  . Alcohol use: No  . Drug use:  No     Family Hx: The patient's family history includes Hypertension in his father.  ROS:   Please see the history of present illness.     All other systems reviewed and are negative.   Prior CV studies:   The following studies were reviewed today:  Cardiac Catheterization: 07/2018  Mid LM to Dist LM lesion is 50% stenosed. 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  Ost Ramus lesion is 95% stenosed.  Lat Ramus lesion is 99% stenosed.  Dist RCA lesion is 100% stenosed.  Mid Cx lesion is 70% stenosed.  Prox LAD lesion is 60% stenosed.  The left ventricular systolic function is normal.  LV end diastolic pressure is normal. LVEDP 6 mm Hg.  The left ventricular ejection fraction is 55-65% by visual estimate.  There is no aortic valve stenosis.   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    Limited Echo: 08/2018 Study Conclusions  - Left ventricle: The cavity size was normal. Systolic function was   normal. The estimated ejection fraction was in the range of 55%   to 60%. Wall motion was normal; there were no regional wall   motion abnormalities. Features are consistent with a pseudonormal   left ventricular filling pattern, with concomitant abnormal   relaxation and increased filling pressure (grade 2 diastolic   dysfunction). - Aortic valve: Valve mobility was restricted. Transvalvular   velocity was within the normal range. There was no stenosis.   There was no regurgitation. - Mitral valve: Transvalvular velocity was within the normal range.   There was no evidence for stenosis. There was mild regurgitation. - Right ventricle: The cavity size was normal. Wall thickness was   normal. Systolic function was normal. - Atrial septum: No defect or patent foramen ovale was identified   by color flow Doppler. - Tricuspid valve: There was mild regurgitation.   Carotid Dopplers: 08/2019 IMPRESSION: 1. Stable exam without interval progression of disease compared to 08/31/2018 2. Severe (70-99%) stenosis proximal right internal carotid artery secondary to bulky, heterogeneous but partially calcified atherosclerotic plaque. 3. Mild (1-49%) stenosis proximal left internal carotid artery secondary to heterogenous atherosclerotic plaque.   Labs/Other Tests and Data Reviewed:    EKG:  An ECG dated 10/13/2019 was personally reviewed today and demonstrated:  NSR, HR 72 with 1st degree AV Block and incomplete RBBB.   Recent Labs: 10/13/2019: ALT 20 10/19/2019: BUN 9; Creatinine, Ser 1.01; Hemoglobin 11.8; Platelets 181; Potassium 3.7; Sodium 140   Recent Lipid Panel Lab Results  Component Value Date/Time   CHOL 166 07/30/2018 03:55 AM   TRIG 100 07/30/2018 03:55 AM   HDL 37 (L) 07/30/2018 03:55 AM   CHOLHDL 4.5 07/30/2018 03:55  AM   LDLCALC 109 (H) 07/30/2018 03:55 AM    Wt Readings from Last 3 Encounters:  11/09/19 208 lb (94.3 kg)  10/18/19 205 lb (93 kg)  10/13/19 209 lb 9 oz (95.1 kg)     Objective:    Vital Signs:  Ht 6\' 1"  (1.854 m)   BMI 27.44 kg/m    General: Pleasant male sounding in NAD Psych: Normal affect. Neuro: Alert and oriented X 3. Lungs:  Resp regular and unlabored while talking on the phone.    ASSESSMENT & PLAN:  1. CAD - s/p CABG in 08/2018 with LIMA-LAD and seq SVG to medial and lateral branches of RI. He denies any recent chest pain or dyspnea on exertion. - Continue ASA 81 mg daily, Atorvastatin 80 mg daily, Lopressor 25 mg twice daily and Lisinopril 2.5 mg daily. Will stop Plavix as it was recommended to stop this by Dr. Harl Bowie in 08/2019 and by review of Vascular Surgery notes, it was fine to discontinue from their perspective as well.    2. Carotid Artery Stenosis - s/p right CEA with bovine patch angioplasty in 10/2019 by Dr. Carlis Abbott. Scheduled to have repeat carotid dopplers 9 months out from his recent surgery. Followed by Vascular Surgery. Continue ASA and statin therapy.    3. HTN - He does not have a blood pressure cuff at home and readings have been variable when checked at his PCP's office as outlined above. He is currently on Lisinopril 2.5 mg daily and Lopressor 25 mg twice daily. He did not start Amlodipine or HCTZ as recently prescribed by his PCP. Given well-controlled blood pressure readings at his prior office visits, I recommended that they monitor BP at home. They do not currently have a cuff so we will see if Social Work has another one available. If BP elevated over the next 2-3 weeks, would favor titration of Lisinopril prior to adding additional medications.   4. HLD - followed by PCP. On Atorvastatin 80mg  daily. Goal LDL is less than 70 with known CAD.    COVID-19 Education: The signs and symptoms of COVID-19 were discussed with the patient and how to  seek care for testing (follow up with PCP or arrange E-visit).  The importance of social distancing was discussed today.  Time:   Today, I have spent 18 minutes with the patient with telehealth technology discussing the above problems.     Medication Adjustments/Labs and Tests Ordered: Current medicines are reviewed at length with the patient today.  Concerns regarding medicines are outlined above.   Tests Ordered: No orders of the defined types were placed in this encounter.   Medication Changes: No orders of the defined types were placed in this encounter.   Follow Up: Return BP Log in 2-3 weeks;   Either In Person or Virtual in 6 month(s)  Signed, Erma Heritage, PA-C  12/15/2019 4:43 PM    [facility]

## 2019-12-15 NOTE — Patient Instructions (Signed)
Medication Instructions:  Your physician has recommended you make the following change in your medication:  Stop Taking Plavix   *If you need a refill on your cardiac medications before your next appointment, please call your pharmacy*  Lab Work: NONE  If you have labs (blood work) drawn today and your tests are completely normal, you will receive your results only by: Marland Kitchen MyChart Message (if you have MyChart) OR . A paper copy in the mail If you have any lab test that is abnormal or we need to change your treatment, we will call you to review the results.  Testing/Procedures: Your physician has requested that you regularly monitor and record your blood pressure readings at home. Please use the same machine at the same time of day to check your readings and record them to bring to your follow-up visit.  Call with readings or return log in 2-3 weeks   Follow-Up: At [facility], you and your health needs are our priority.  As part of our continuing mission to provide you with exceptional heart care, we have created designated Provider Care Teams.  These Care Teams include your primary Cardiologist (physician) and Advanced Practice Providers (APPs -  Physician Assistants and Nurse Practitioners) who all work together to provide you with the care you need, when you need it.  Your next appointment:   6 month(s)  The format for your next appointment:   In Person  Provider:   Carlyle Dolly, MD  Other Instructions Thank you for choosing [facility]!

## 2019-12-16 ENCOUNTER — Telehealth: Payer: Self-pay | Admitting: Licensed Clinical Social Worker

## 2019-12-16 NOTE — Telephone Encounter (Signed)
CSW referred to assist patient with obtaining a BP cuff. CSW contacted patient to inform cuff will be delivered to home. Patient grateful for support and assistance. CSW available as needed. Jackie Shirlene Andaya, LCSW, CCSW-MCS [phone]  

## 2020-01-04 ENCOUNTER — Ambulatory Visit: Payer: Medicare HMO | Attending: Family

## 2020-01-04 ENCOUNTER — Other Ambulatory Visit: Payer: Self-pay

## 2020-01-04 DIAGNOSIS — Z20822 Contact with and (suspected) exposure to covid-19: Secondary | ICD-10-CM

## 2020-03-14 ENCOUNTER — Encounter (HOSPITAL_COMMUNITY): Payer: Self-pay

## 2020-03-14 ENCOUNTER — Other Ambulatory Visit: Payer: Self-pay

## 2020-03-14 DIAGNOSIS — Z87891 Personal history of nicotine dependence: Secondary | ICD-10-CM | POA: Diagnosis not present

## 2020-03-14 DIAGNOSIS — Z7982 Long term (current) use of aspirin: Secondary | ICD-10-CM | POA: Insufficient documentation

## 2020-03-14 DIAGNOSIS — I10 Essential (primary) hypertension: Secondary | ICD-10-CM | POA: Diagnosis not present

## 2020-03-14 DIAGNOSIS — R05 Cough: Secondary | ICD-10-CM | POA: Diagnosis present

## 2020-03-14 DIAGNOSIS — Z79899 Other long term (current) drug therapy: Secondary | ICD-10-CM | POA: Insufficient documentation

## 2020-03-14 DIAGNOSIS — I2510 Atherosclerotic heart disease of native coronary artery without angina pectoris: Secondary | ICD-10-CM | POA: Diagnosis not present

## 2020-03-14 NOTE — ED Triage Notes (Signed)
Pt presents to ED with complaints of non productive cough and runny nose started today. Pt denies SOB or fever.

## 2020-03-15 ENCOUNTER — Emergency Department (HOSPITAL_COMMUNITY)
Admission: EM | Admit: 2020-03-15 | Discharge: 2020-03-15 | Disposition: A | Discharge status: Home / Self Care | Payer: Medicare HMO | Attending: Emergency Medicine | Admitting: Emergency Medicine

## 2020-03-15 ENCOUNTER — Emergency Department (HOSPITAL_COMMUNITY): Payer: Medicare HMO

## 2020-03-15 DIAGNOSIS — J069 Acute upper respiratory infection, unspecified: Secondary | ICD-10-CM

## 2020-03-15 MED ORDER — ALBUTEROL SULFATE HFA 108 (90 BASE) MCG/ACT IN AERS
2.0000 | INHALATION_SPRAY | Freq: Once | RESPIRATORY_TRACT | Status: AC
  Administered 2020-03-15: 2 via RESPIRATORY_TRACT
  Filled 2020-03-15: qty 6.7

## 2020-03-15 MED ORDER — IPRATROPIUM-ALBUTEROL 0.5-2.5 (3) MG/3ML IN SOLN: 3.0000 mL | Freq: Once | RESPIRATORY_TRACT | Status: DC

## 2020-03-15 MED ORDER — AEROCHAMBER PLUS FLO-VU MEDIUM MISC
1.0000 | Freq: Once | Status: AC
  Administered 2020-03-15: 1

## 2020-03-15 MED ORDER — HYDROCODONE-ACETAMINOPHEN 5-325 MG PO TABS: 1.0000 | ORAL_TABLET | ORAL | 0 refills | Status: DC | PRN

## 2020-03-15 MED ORDER — DEXAMETHASONE 4 MG PO TABS
10.0000 mg | ORAL_TABLET | Freq: Once | ORAL | Status: AC
  Administered 2020-03-15: 10 mg via ORAL
  Filled 2020-03-15: qty 3

## 2020-03-15 NOTE — Discharge Instructions (Signed)
Use the inhaler, 2 puffs at a time, every 4 hours as needed to treat your cough.  You may use the hydrocodone-acetaminophen tablets at bedtime to help suppress the cough while you are sleeping.  Return if you start running a fever or becomes short of breath.

## 2020-03-15 NOTE — ED Provider Notes (Signed)
[facility] EMERGENCY DEPARTMENT Provider Note   CSN: [id number] Arrival date & time: 03/14/20  2140   History Chief Complaint  Patient presents with  . Cough    Timothy Simpson is a 74 y.o. male.  The history is provided by the patient.  Cough He has history of hypertension, hyperlipidemia, carotid artery disease, coronary artery disease and he comes in with cough which started yesterday.  Cough is nonproductive, but it has kept him from sleeping.  He denies fever, chills, sweats.  He denies dyspnea.  There has been some slight, clear rhinorrhea.  He denies chest pain or body aches.  He denies change in sense of smell or taste.  He did try using an over-the-counter cough medicine without relief.  He denies any sick contacts and specifically denies exposure to COVID-19.  He has received both doses of COVID-19 vaccine.  Past Medical History:  Diagnosis Date  . Coronary artery disease   . Gout   . High cholesterol   . Hypertension   . NSTEMI (non-ST elevated myocardial infarction) ([place]) 07/29/2018  . 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 Tobacco abuse     Patient Active Problem List   Diagnosis Date Noted  . Carotid stenosis 09/28/2019  . Hx of adenomatous colonic polyps 01/18/2019  . Anemia 01/18/2019  . 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 Coronary artery disease   . Essential hypertension   . Hyperlipidemia   . Gout   . Tobacco abuse   . NSTEMI (non-ST elevated myocardial infarction) ([place]) 07/29/2018    Past Surgical History:  Procedure Laterality Date  . BACK SURGERY    . COLONOSCOPY  2004   Dr. Gala Romney: Normal.  For history of tubulovillous adenoma back in 2001, five-year follow-up recommended.  . COLONOSCOPY  2009   Dr. Emerson Monte: 6 mm polyp in the ascending segment and one in the sigmoid colon removed.  Pathology showed tubular adenomas.  5-year surveillance  colonoscopy recommended.  . COLONOSCOPY N/A 07/07/2019   Procedure: COLONOSCOPY;  Surgeon: Daneil Dolin, MD;  [facility];  Service: Endoscopy;  Laterality: N/A;  1:00pm  . CORONARY ARTERY BYPASS GRAFT N/A 08/05/2018   Procedure: CORONARY ARTERY BYPASS GRAFTING (CABG) x3. Endoscopic Saphenous vein harvest.;  Surgeon: Rexene Alberts, MD;  Location: [place];  Service: Open Heart Surgery;  Laterality: N/A;  . ENDARTERECTOMY Right 10/18/2019   Procedure: ENDARTERECTOMY CAROTID;  Surgeon: Marty Heck, MD;  Location: [place];  Service: Vascular;  Laterality: Right;  . INTRAVASCULAR PRESSURE WIRE/FFR STUDY N/A 08/04/2018   Procedure: INTRAVASCULAR PRESSURE WIRE/FFR STUDY;  Surgeon: Burnell Blanks, MD;  Location: [place] [facility];  Service: Cardiovascular;  Laterality: N/A;  . LEFT HEART CATH AND CORONARY ANGIOGRAPHY N/A 07/30/2018   Procedure: LEFT HEART CATH AND CORONARY ANGIOGRAPHY;  Surgeon: Jettie Booze, MD;  Location: [place] [facility];  Service: Cardiovascular;  Laterality: N/A;  . PATCH ANGIOPLASTY Right 10/18/2019   Procedure: Patch Angioplasty;  Surgeon: Marty Heck, MD;  Location: [place];  Service: Vascular;  Laterality: Right;  . POLYPECTOMY  07/07/2019   Procedure: POLYPECTOMY;  Surgeon: Daneil Dolin, MD;  [facility];  Service: Endoscopy;;  . TEE WITHOUT CARDIOVERSION N/A 08/05/2018   Procedure: TRANSESOPHAGEAL ECHOCARDIOGRAM (TEE);  Surgeon: Rexene Alberts, MD;  Location: [place];  Service: Open Heart Surgery;  Laterality: N/A;       Family History  Problem Relation Age of Onset  . Hypertension Father     Social History   Tobacco Use  . Smoking status: Former Smoker    Packs/day: 0.25    Years: 40.00    Pack years: 10.00    Types: Cigarettes    Quit date: 11/01/2019    Years since quitting: 0.3  . Smokeless tobacco: Never Used  . Tobacco comment: he sated that he would quit if he was told he had to.   Substance Use  Topics  . Alcohol use: No  . Drug use: No    Home Medications Prior to Admission medications   Medication Sig Start Date End Date Taking? Authorizing Provider  allopurinol (ZYLOPRIM) 100 MG tablet Take 100 mg by mouth daily. 04/28/18   [provider]  aspirin EC 81 MG EC tablet Take 1 tablet (81 mg total) by mouth daily. 08/09/18   Nani Skillern, PA-C  atorvastatin (LIPITOR) 80 MG tablet Take 1 tablet (80 mg total) by mouth daily at 6 PM. 08/09/18   Nani Skillern, PA-C  cetirizine (ZYRTEC) 10 MG tablet Take 10 mg by mouth daily as needed for allergies.    [provider]  donepezil (ARICEPT) 10 MG tablet Take 10 mg by mouth daily. 09/06/19   [provider]  ferrous sulfate 325 (65 FE) MG EC tablet Take 1 tablet (325 mg total) by mouth 2 (two) times daily. 08/11/18 12/15/19  Barrett, Erin R, PA-C  hydroxypropyl methylcellulose / hypromellose (ISOPTO TEARS / GONIOVISC) 2.5 % ophthalmic solution Place 1 drop into both eyes 2 (two) times daily as needed for dry eyes.     [provider]  lisinopril (ZESTRIL) 2.5 MG tablet TAKE 1 TABLET BY MOUTH ONCE DAILY. 12/13/19   Arnoldo Lenis, MD  metoprolol tartrate (LOPRESSOR) 25 MG tablet Take 1 tablet (25 mg total) by mouth 2 (two) times daily. 08/11/18   Barrett, Erin R, PA-C  oxymetazoline (AFRIN) 0.05 % nasal spray Place 1 spray into both nostrils 2 (two) times daily as needed for congestion.    [provider]    Allergies    Patient has no known allergies.  Review of Systems   Review of Systems  Respiratory: Positive for cough.   All other systems reviewed and are negative.   Physical Exam Updated Vital Signs BP (!) 156/97 (BP Location: Right Arm)   Pulse (!) 107   Temp 99.5 F (37.5 C) (Oral)   Resp 18   Ht 6\' 2"  (1.88 m)   Wt 86.2 kg   SpO2 96%   BMI 24.39 kg/m   Physical Exam Vitals and nursing note reviewed.   74 year old male, resting comfortably and in no acute  distress. Vital signs are significant for elevated blood pressure and heart rate. Oxygen saturation is 96%, which is normal. Head is normocephalic and atraumatic. PERRLA, EOMI. Oropharynx is clear. Neck is nontender and supple without adenopathy or JVD. Back is nontender and there is no CVA tenderness. Lungs have diffuse expiratory rhonchi and prolonged exhalation phase, but no rales or wheezes. Chest is nontender. Heart has regular rate and rhythm without murmur. Abdomen is soft, flat, nontender without masses or hepatosplenomegaly and peristalsis is normoactive. Extremities have no cyanosis or edema, full range of motion is present. Skin is warm and dry without rash. Neurologic: Mental status is normal, cranial nerves are intact, there are no motor or sensory deficits.  ED Results / Procedures / Treatments    Radiology No results found.  Procedures Procedures   Medications Ordered in ED Medications - No data to display  ED Course  I have reviewed the triage vital signs and the nursing notes.  Pertinent imaging results that were available during my care of the patient were reviewed by me and considered in my medical decision making (see chart for details).  Cough which is consistent with viral bronchitis or pneumonia.  Chest x-ray is obtained which shows no obvious infiltrates.  Will give therapeutic trial of albuterol via inhaler.  Old records are reviewed, and he has no relevant past visits.  Given the fact that he has completed his COVID-19 vaccination series, COVID-19 is felt to be very unlikely.  Following albuterol, he is not coughing.  He is advised to use the inhaler every 4 hours as needed to help suppress the cough.  Also, given a take-home pack of hydrocodone-acetaminophen to use to suppress cough at nighttime.  He is given a dose of dexamethasone prior to discharge.  MDM Rules/Calculators/A&P Timothy Simpson was evaluated in Emergency Department on 03/15/2020 for the  symptoms described in the history of present illness. He was evaluated in the context of the global COVID-19 pandemic, which necessitated consideration that the patient might be at risk for infection with the SARS-CoV-2 virus that causes COVID-19. Institutional protocols and algorithms that pertain to the evaluation of patients at risk for COVID-19 are in a state of rapid change based on information released by regulatory bodies including the CDC and federal and state organizations. These policies and algorithms were followed during the patient's care in the ED.  Final Clinical Impression(s) / ED Diagnoses Final diagnoses:  None    Rx / DC Orders ED Discharge Orders    None       Delora Fuel, MD [id number]

## 2020-03-16 MED FILL — Hydrocodone-Acetaminophen Tab 5-325 MG: ORAL | Qty: 6 | Status: AC

## 2020-06-19 ENCOUNTER — Emergency Department (HOSPITAL_COMMUNITY): Payer: Medicare HMO

## 2020-06-19 ENCOUNTER — Encounter (HOSPITAL_COMMUNITY): Payer: Self-pay | Admitting: *Deleted

## 2020-06-19 ENCOUNTER — Emergency Department (HOSPITAL_COMMUNITY)
Admission: EM | Admit: 2020-06-19 | Discharge: 2020-06-19 | Disposition: A | Discharge status: Home / Self Care | Payer: Medicare HMO | Attending: Emergency Medicine | Admitting: Emergency Medicine

## 2020-06-19 ENCOUNTER — Other Ambulatory Visit: Payer: Self-pay

## 2020-06-19 DIAGNOSIS — Z7982 Long term (current) use of aspirin: Secondary | ICD-10-CM | POA: Diagnosis not present

## 2020-06-19 DIAGNOSIS — Z86010 Personal history of colonic polyps: Secondary | ICD-10-CM | POA: Diagnosis not present

## 2020-06-19 DIAGNOSIS — R109 Unspecified abdominal pain: Secondary | ICD-10-CM | POA: Diagnosis present

## 2020-06-19 DIAGNOSIS — E785 Hyperlipidemia, unspecified: Secondary | ICD-10-CM | POA: Diagnosis not present

## 2020-06-19 DIAGNOSIS — K56600 Partial intestinal obstruction, unspecified as to cause: Secondary | ICD-10-CM | POA: Diagnosis not present

## 2020-06-19 DIAGNOSIS — K529 Noninfective gastroenteritis and colitis, unspecified: Secondary | ICD-10-CM | POA: Diagnosis not present

## 2020-06-19 DIAGNOSIS — Z8249 Family history of ischemic heart disease and other diseases of the circulatory system: Secondary | ICD-10-CM | POA: Diagnosis not present

## 2020-06-19 DIAGNOSIS — Z951 Presence of aortocoronary bypass graft: Secondary | ICD-10-CM | POA: Insufficient documentation

## 2020-06-19 DIAGNOSIS — Z87891 Personal history of nicotine dependence: Secondary | ICD-10-CM | POA: Insufficient documentation

## 2020-06-19 DIAGNOSIS — I10 Essential (primary) hypertension: Secondary | ICD-10-CM | POA: Diagnosis not present

## 2020-06-19 DIAGNOSIS — I2510 Atherosclerotic heart disease of native coronary artery without angina pectoris: Secondary | ICD-10-CM | POA: Diagnosis not present

## 2020-06-19 DIAGNOSIS — I252 Old myocardial infarction: Secondary | ICD-10-CM | POA: Diagnosis not present

## 2020-06-19 DIAGNOSIS — Z79899 Other long term (current) drug therapy: Secondary | ICD-10-CM | POA: Insufficient documentation

## 2020-06-19 LAB — CBC
HCT: 50.4 % (ref 39.0–52.0)
Hemoglobin: 16.4 g/dL (ref 13.0–17.0)
MCH: 30.1 pg (ref 26.0–34.0)
MCHC: 32.5 g/dL (ref 30.0–36.0)
MCV: 92.6 fL (ref 80.0–100.0)
Platelets: 208 10*3/uL (ref 150–400)
RBC: 5.44 MIL/uL (ref 4.22–5.81)
RDW: 13.3 % (ref 11.5–15.5)
WBC: 9.4 10*3/uL (ref 4.0–10.5)
nRBC: 0 % (ref 0.0–0.2)

## 2020-06-19 LAB — COMPREHENSIVE METABOLIC PANEL
ALT: 13 U/L (ref 0–44)
AST: 16 U/L (ref 15–41)
Albumin: 4.7 g/dL (ref 3.5–5.0)
Alkaline Phosphatase: 99 U/L (ref 38–126)
Anion gap: 10 (ref 5–15)
BUN: 12 mg/dL (ref 8–23)
CO2: 28 mmol/L (ref 22–32)
Calcium: 9.5 mg/dL (ref 8.9–10.3)
Chloride: 101 mmol/L (ref 98–111)
Creatinine, Ser: 0.97 mg/dL (ref 0.61–1.24)
GFR calc Af Amer: >60 mL/min (ref >60)
GFR calc non Af Amer: >60 mL/min (ref >60)
Glucose, Bld: 116 mg/dL — ABNORMAL HIGH (ref 70–99)
Potassium: 4 mmol/L (ref 3.5–5.1)
Sodium: 139 mmol/L (ref 135–145)
Total Bilirubin: 1 mg/dL (ref 0.3–1.2)
Total Protein: 8.2 g/dL — ABNORMAL HIGH (ref 6.5–8.1)

## 2020-06-19 LAB — LIPASE, BLOOD: Lipase: 30 U/L (ref 11–51)

## 2020-06-19 MED ORDER — ONDANSETRON HCL 4 MG PO TABS: 4.0000 mg | ORAL_TABLET | Freq: Four times a day (QID) | ORAL | 0 refills | Status: DC

## 2020-06-19 MED ORDER — AMOXICILLIN-POT CLAVULANATE 875-125 MG PO TABS
1.0000 | ORAL_TABLET | Freq: Once | ORAL | Status: AC
  Administered 2020-06-19: 1 via ORAL
  Filled 2020-06-19: qty 1

## 2020-06-19 MED ORDER — IOHEXOL 300 MG/ML  SOLN
100.0000 mL | Freq: Once | INTRAMUSCULAR | Status: AC | PRN
  Administered 2020-06-19: 100 mL via INTRAVENOUS

## 2020-06-19 MED ORDER — AMOXICILLIN-POT CLAVULANATE 875-125 MG PO TABS: 1.0000 | ORAL_TABLET | Freq: Two times a day (BID) | ORAL | 0 refills | Status: DC

## 2020-06-19 MED ORDER — SODIUM CHLORIDE 0.9% FLUSH: 3.0000 mL | Freq: Once | INTRAVENOUS | Status: DC

## 2020-06-19 NOTE — ED Triage Notes (Signed)
Sporadic episodes of abdominal pain onset last night

## 2020-06-25 NOTE — ED Provider Notes (Signed)
[facility] Provider Note   CSN: [id number] Arrival date & time: 06/19/20  1449     History Chief Complaint  Patient presents with  . Abdominal Pain    Timothy Simpson is a 74 y.o. male.  HPI   73yM with abdominal pain.  Onset last night.  Pain is diffuse.  Describes as crampy.  Comes and goes without appreciable exacerbating factor.  He has been eating.  Some loose stools.  No fevers.  No blood in stool.  No urinary complaints.  Denies any past abdominal surgical history.  Past Medical History:  Diagnosis Date  . Coronary artery disease   . Gout   . High cholesterol   . Hypertension   . NSTEMI (non-ST elevated myocardial infarction) ([place]) 07/29/2018  . 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 Tobacco abuse     Patient Active Problem List   Diagnosis Date Noted  . Carotid stenosis 09/28/2019  . Hx of adenomatous colonic polyps 01/18/2019  . Anemia 01/18/2019  . 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 Coronary artery disease   . Essential hypertension   . Hyperlipidemia   . Gout   . Tobacco abuse   . NSTEMI (non-ST elevated myocardial infarction) ([place]) 07/29/2018    Past Surgical History:  Procedure Laterality Date  . BACK SURGERY    . COLONOSCOPY  2004   Dr. Gala Romney: Normal.  For history of tubulovillous adenoma back in 2001, five-year follow-up recommended.  . COLONOSCOPY  2009   Dr. Emerson Monte: 6 mm polyp in the ascending segment and one in the sigmoid colon removed.  Pathology showed tubular adenomas.  5-year surveillance colonoscopy recommended.  . COLONOSCOPY N/A 07/07/2019   Procedure: COLONOSCOPY;  Surgeon: Daneil Dolin, MD;  [facility];  Service: Endoscopy;  Laterality: N/A;  1:00pm  . CORONARY ARTERY BYPASS GRAFT N/A 08/05/2018   Procedure: CORONARY ARTERY BYPASS GRAFTING (CABG) x3. Endoscopic Saphenous vein harvest.;  Surgeon:  Rexene Alberts, MD;  Location: [facility];  Service: Open Heart Surgery;  Laterality: N/A;  . ENDARTERECTOMY Right 10/18/2019   Procedure: ENDARTERECTOMY CAROTID;  Surgeon: Marty Heck, MD;  Location: [facility];  Service: Vascular;  Laterality: Right;  . INTRAVASCULAR PRESSURE WIRE/FFR STUDY N/A 08/04/2018   Procedure: INTRAVASCULAR PRESSURE WIRE/FFR STUDY;  Surgeon: Burnell Blanks, MD;  Location: [facility];  Service: Cardiovascular;  Laterality: N/A;  . LEFT HEART CATH AND CORONARY ANGIOGRAPHY N/A 07/30/2018   Procedure: LEFT HEART CATH AND CORONARY ANGIOGRAPHY;  Surgeon: Jettie Booze, MD;  Location: [facility];  Service: Cardiovascular;  Laterality: N/A;  . PATCH ANGIOPLASTY Right 10/18/2019   Procedure: Patch Angioplasty;  Surgeon: Marty Heck, MD;  Location: [facility];  Service: Vascular;  Laterality: Right;  . POLYPECTOMY  07/07/2019   Procedure: POLYPECTOMY;  Surgeon: Daneil Dolin, MD;  [facility];  Service: Endoscopy;;  . TEE WITHOUT CARDIOVERSION N/A 08/05/2018   Procedure: TRANSESOPHAGEAL ECHOCARDIOGRAM (TEE);  Surgeon: Rexene Alberts, MD;  Location: [facility];  Service: Open Heart Surgery;  Laterality: N/A;       Family History  Problem Relation Age of Onset  . Hypertension Father     Social History   Tobacco Use  . Smoking status: Former Smoker    Packs/day: 0.25    Years: 40.00    Pack  years: 10.00    Types: Cigarettes    Quit date: 11/01/2019    Years since quitting: 0.6  . Smokeless tobacco: Never Used  . Tobacco comment: he sated that he would quit if he was told he had to.   Vaping Use  . Vaping Use: Never used  Substance Use Topics  . Alcohol use: No  . Drug use: No    Home Medications Prior to Admission medications   Medication Sig Start Date End Date Taking? Authorizing Provider  allopurinol (ZYLOPRIM) 100 MG tablet Take 100 mg by mouth daily. 04/28/18   [provider]  amoxicillin-clavulanate  (AUGMENTIN) 875-125 MG tablet Take 1 tablet by mouth 2 (two) times daily. 06/19/20   Virgel Manifold, MD  aspirin EC 81 MG EC tablet Take 1 tablet (81 mg total) by mouth daily. 08/09/18   Nani Skillern, PA-C  atorvastatin (LIPITOR) 80 MG tablet Take 1 tablet (80 mg total) by mouth daily at 6 PM. 08/09/18   Nani Skillern, PA-C  cetirizine (ZYRTEC) 10 MG tablet Take 10 mg by mouth daily as needed for allergies.    [provider]  donepezil (ARICEPT) 10 MG tablet Take 10 mg by mouth daily. 09/06/19   [provider]  ferrous sulfate 325 (65 FE) MG EC tablet Take 1 tablet (325 mg total) by mouth 2 (two) times daily. 08/11/18 12/15/19  Barrett, Lodema Hong, PA-C  HYDROcodone-acetaminophen (NORCO) 5-325 MG tablet Take 1 tablet by mouth every 4 (four) hours as needed (coughing). 2/70/62   Delora Fuel, MD  hydroxypropyl methylcellulose / hypromellose (ISOPTO TEARS / GONIOVISC) 2.5 % ophthalmic solution Place 1 drop into both eyes 2 (two) times daily as needed for dry eyes.     [provider]  lisinopril (ZESTRIL) 2.5 MG tablet TAKE 1 TABLET BY MOUTH ONCE DAILY. 12/13/19   Arnoldo Lenis, MD  metoprolol tartrate (LOPRESSOR) 25 MG tablet Take 1 tablet (25 mg total) by mouth 2 (two) times daily. 08/11/18   Barrett, Erin R, PA-C  ondansetron (ZOFRAN) 4 MG tablet Take 1 tablet (4 mg total) by mouth every 6 (six) hours. 06/19/20   Virgel Manifold, MD  oxymetazoline (AFRIN) 0.05 % nasal spray Place 1 spray into both nostrils 2 (two) times daily as needed for congestion.    [provider]    Allergies    Patient has no known allergies.  Review of Systems   Review of Systems All systems reviewed and negative, other than as noted in HPI.  Physical Exam Updated Vital Signs BP (!) 149/90 (BP Location: Right Arm)   Pulse 100   Temp 98.9 F (37.2 C) (Oral)   Resp 16   Ht 6' (1.829 m)   Wt 97.5 kg   SpO2 96%   BMI 29.16 kg/m   Physical Exam Vitals and nursing  note reviewed.  Constitutional:      General: He is not in acute distress.    Appearance: He is well-developed.  HENT:     Head: Normocephalic and atraumatic.  Eyes:     General:        Right eye: No discharge.        Left eye: No discharge.     Conjunctiva/sclera: Conjunctivae normal.  Cardiovascular:     Rate and Rhythm: Normal rate and regular rhythm.     Heart sounds: Normal heart sounds. No murmur heard.  No friction rub. No gallop.   Pulmonary:     Effort: Pulmonary effort is  normal. No respiratory distress.     Breath sounds: Normal breath sounds.  Abdominal:     General: There is no distension.     Palpations: Abdomen is soft.     Tenderness: There is abdominal tenderness.     Comments: Mild diffuse tenderness without rebound or guarding.  No distention.  Musculoskeletal:        General: No tenderness.     Cervical back: Neck supple.  Skin:    General: Skin is warm and dry.  Neurological:     Mental Status: He is alert.  Psychiatric:        Behavior: Behavior normal.        Thought Content: Thought content normal.     ED Results / Procedures / Treatments   Labs (all labs ordered are listed, but only abnormal results are displayed) Labs Reviewed  COMPREHENSIVE METABOLIC PANEL - Abnormal; Notable for the following components:      Result Value   Glucose, Bld 116 (*)    Total Protein 8.2 (*)    All other components within normal limits  LIPASE, BLOOD  CBC    EKG None  Radiology No results found.  Procedures Procedures (including critical care time)  Medications Ordered in ED Medications  iohexol (OMNIPAQUE) 300 MG/ML solution 100 mL (100 mLs Intravenous Contrast Given 06/19/20 2009)  amoxicillin-clavulanate (AUGMENTIN) 875-125 MG per tablet 1 tablet (1 tablet Oral Given 06/19/20 2128)    ED Course  I have reviewed the triage vital signs and the nursing notes.  Pertinent labs & imaging results that were available during my care of the patient were  reviewed by me and considered in my medical decision making (see chart for details).    MDM Rules/Calculators/A&P                          74 year old male with abdominal pain.  CT showing ileitis.  Afebrile.  Tolerating p.o.  I think is appropriate for outpatient treatment.  Return precautions discussed.  Diet discussed.  Outpatient follow-up otherwise.  Final Clinical Impression(s) / ED Diagnoses Final diagnoses:  Ileitis    Rx / DC Orders ED Discharge Orders         Ordered    amoxicillin-clavulanate (AUGMENTIN) 875-125 MG tablet  2 times daily     Discontinue  Reprint     06/19/20 2124    ondansetron (ZOFRAN) 4 MG tablet  Every 6 hours     Discontinue  Reprint     06/19/20 2124           Virgel Manifold, MD 06/25/20 1951

## 2020-07-03 ENCOUNTER — Ambulatory Visit: Payer: Medicare HMO | Admitting: Cardiology

## 2020-07-25 ENCOUNTER — Ambulatory Visit: Payer: Medicare HMO | Admitting: Cardiology

## 2020-07-28 ENCOUNTER — Other Ambulatory Visit: Payer: Self-pay

## 2020-07-28 ENCOUNTER — Encounter: Payer: Self-pay | Admitting: Student

## 2020-07-28 ENCOUNTER — Ambulatory Visit (INDEPENDENT_AMBULATORY_CARE_PROVIDER_SITE_OTHER): Payer: Medicare HMO | Admitting: Student

## 2020-07-28 VITALS — BP 120/70 | HR 69 | Ht 72.0 in | Wt 201.0 lb

## 2020-07-28 DIAGNOSIS — I10 Essential (primary) hypertension: Secondary | ICD-10-CM

## 2020-07-28 DIAGNOSIS — E785 Hyperlipidemia, unspecified: Secondary | ICD-10-CM

## 2020-07-28 DIAGNOSIS — I6523 Occlusion and stenosis of bilateral carotid arteries: Secondary | ICD-10-CM

## 2020-07-28 DIAGNOSIS — I2510 Atherosclerotic heart disease of native coronary artery without angina pectoris: Secondary | ICD-10-CM | POA: Diagnosis not present

## 2020-07-28 NOTE — Progress Notes (Signed)
Cardiology Office Note    Date:  07/29/2020   ID:  Timothy, Simpson 28-Jan-1946, MRN [id number]  PCP:  Jani Gravel, MD  Cardiologist: Carlyle Dolly, MD    Chief Complaint  Patient presents with  . Follow-up    6 month visit    History of Present Illness:    Timothy Simpson is a 74 y.o. male with past medical history of CAD (s/p CABG in 08/2018 with LIMA-LAD and seq SVG to medial and lateral branches of RI), HTN, HLD, carotid artery stenosis (s/p R CEA in 10/2019) and prior tobacco use who presents to the office today for 23-month follow-up.  He most recently had a telehealth visit with myself in 12/2019 and had recently undergone right CEA by Dr. Carlis Abbott in 10/2019. He was overall doing well since his surgery. He denied any recent chest pain or dyspnea on exertion. BP had been elevated when checked by his PCP and he was prescribed Amlodipine and HCTZ but had not yet started the medications. Given the timeframe since his prior ACS, Plavix was discontinued and he was continued on ASA 81 mg daily, Atorvastatin 80 mg daily, Lopressor 25 mg twice daily and Lisinopril 2.5 mg daily. He was encouraged to continue to monitor BP at home to see if it was indicated for him to be on additional therapy with Amlodipine or HCTZ.  He was evaluated in the ED in 06/2020 for diffuse abdominal pain which he described as a cramping sensation. LFT's and Lipase were within a normal range. CT Abdomen showed possible infectious or inflammatory change of the ileum concerning for ileitis and he was discharged on Augmentin.  In talking with the patient today, he reports doing well since his last visit. 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 He denies any recent chest pain or dyspnea on exertion when performing routine activities. No recent orthopnea, PND, lower extremity edema or palpitations. He does report that his wife has mentioned he has a dry cough which  occurs on a daily basis and has been persistent for several months.  Past Medical History:  Diagnosis Date  . Coronary artery disease   . Gout   . High cholesterol   . Hypertension   . NSTEMI (non-ST elevated myocardial infarction) ([place]) 07/29/2018  . 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 Tobacco abuse     Past Surgical History:  Procedure Laterality Date  . BACK SURGERY    . COLONOSCOPY  2004   Dr. Gala Romney: Normal.  For history of tubulovillous adenoma back in 2001, five-year follow-up recommended.  . COLONOSCOPY  2009   Dr. Emerson Monte: 6 mm polyp in the ascending segment and one in the sigmoid colon removed.  Pathology showed tubular adenomas.  5-year surveillance colonoscopy recommended.  . COLONOSCOPY N/A 07/07/2019   Procedure: COLONOSCOPY;  Surgeon: Daneil Dolin, MD;  [facility];  Service: Endoscopy;  Laterality: N/A;  1:00pm  . CORONARY ARTERY BYPASS GRAFT N/A 08/05/2018   Procedure: CORONARY ARTERY BYPASS GRAFTING (CABG) x3. Endoscopic Saphenous vein harvest.;  Surgeon: Rexene Alberts, MD;  Location: [facility];  Service: Open Heart Surgery;  Laterality: N/A;  . ENDARTERECTOMY Right 10/18/2019   Procedure: ENDARTERECTOMY CAROTID;  Surgeon: Marty Heck, MD;  Location: [facility];  Service: Vascular;  Laterality: Right;  .  INTRAVASCULAR PRESSURE WIRE/FFR STUDY N/A 08/04/2018   Procedure: INTRAVASCULAR PRESSURE WIRE/FFR STUDY;  Surgeon: Burnell Blanks, MD;  Location: [facility];  Service: Cardiovascular;  Laterality: N/A;  . LEFT HEART CATH AND CORONARY ANGIOGRAPHY N/A 07/30/2018   Procedure: LEFT HEART CATH AND CORONARY ANGIOGRAPHY;  Surgeon: Jettie Booze, MD;  Location: [facility];  Service: Cardiovascular;  Laterality: N/A;  . PATCH ANGIOPLASTY Right 10/18/2019   Procedure: Patch Angioplasty;  Surgeon: Marty Heck, MD;   Location: [facility];  Service: Vascular;  Laterality: Right;  . POLYPECTOMY  07/07/2019   Procedure: POLYPECTOMY;  Surgeon: Daneil Dolin, MD;  [facility];  Service: Endoscopy;;  . TEE WITHOUT CARDIOVERSION N/A 08/05/2018   Procedure: TRANSESOPHAGEAL ECHOCARDIOGRAM (TEE);  Surgeon: Rexene Alberts, MD;  Location: [facility];  Service: Open Heart Surgery;  Laterality: N/A;    Current Medications: Outpatient Medications Prior to Visit  Medication Sig Dispense Refill  . allopurinol (ZYLOPRIM) 100 MG tablet Take 100 mg by mouth daily.    Marland Kitchen amLODipine (NORVASC) 5 MG tablet Take 5 mg by mouth daily.    Marland Kitchen aspirin EC 81 MG EC tablet Take 1 tablet (81 mg total) by mouth daily.    Marland Kitchen atorvastatin (LIPITOR) 80 MG tablet Take 1 tablet (80 mg total) by mouth daily at 6 PM. 30 tablet 1  . cetirizine (ZYRTEC) 10 MG tablet Take 10 mg by mouth daily as needed for allergies.    Marland Kitchen donepezil (ARICEPT) 10 MG tablet Take 10 mg by mouth daily.    . ferrous sulfate 325 (65 FE) MG EC tablet Take 1 tablet (325 mg total) by mouth 2 (two) times daily. 60 tablet 3  . HYDROcodone-acetaminophen (NORCO) 5-325 MG tablet Take 1 tablet by mouth every 4 (four) hours as needed (coughing). 6 tablet 0  . hydroxypropyl methylcellulose / hypromellose (ISOPTO TEARS / GONIOVISC) 2.5 % ophthalmic solution Place 1 drop into both eyes 2 (two) times daily as needed for dry eyes.     Marland Kitchen lisinopril (ZESTRIL) 2.5 MG tablet TAKE 1 TABLET BY MOUTH ONCE DAILY. 90 tablet 3  . memantine (NAMENDA) 5 MG tablet Take 5 mg by mouth 2 (two) times daily.    . metoprolol tartrate (LOPRESSOR) 25 MG tablet Take 1 tablet (25 mg total) by mouth 2 (two) times daily. 60 tablet 3  . ondansetron (ZOFRAN) 4 MG tablet Take 1 tablet (4 mg total) by mouth every 6 (six) hours. 12 tablet 0  . oxymetazoline (AFRIN) 0.05 % nasal spray Place 1 spray into both nostrils 2 (two) times daily as needed for congestion.    Marland Kitchen amoxicillin-clavulanate (AUGMENTIN) 875-125 MG  tablet Take 1 tablet by mouth 2 (two) times daily. 14 tablet 0   No facility-administered medications prior to visit.     Allergies:   Patient has no known allergies.   Social History   Socioeconomic History  . Marital status: Married    Spouse name: Not on file  . Number of children: Not on file  . Years of education: Not on file  . Highest education level: Not on file  Occupational History  . Not on file  Tobacco Use  . Smoking status: Former Smoker    Packs/day: 0.25    Years: 40.00    Pack years: 10.00    Types: Cigarettes    Quit date: 11/01/2019    Years since quitting: 0.7  . Smokeless tobacco: Never Used  . Tobacco comment: he  sated that he would quit if he was told he had to.   Vaping Use  . Vaping Use: Never used  Substance and Sexual Activity  . Alcohol use: No  . Drug use: No  . Sexual activity: Not on file  Other Topics Concern  . Not on file  Social History Narrative  . Not on file   Social Determinants of Health   Financial Resource Strain:   . Difficulty of Paying Living Expenses: Not on file  Food Insecurity:   . Worried About Charity fundraiser in the Last Year: Not on file  . Ran Out of Food in the Last Year: Not on file  Transportation Needs:   . Lack of Transportation (Medical): Not on file  . Lack of Transportation (Non-Medical): Not on file  Physical Activity:   . Days of Exercise per Week: Not on file  . Minutes of Exercise per Session: Not on file  Stress:   . Feeling of Stress : Not on file  Social Connections:   . Frequency of Communication with Friends and Family: Not on file  . Frequency of Social Gatherings with Friends and Family: Not on file  . Attends Religious Services: Not on file  . Active Member of Clubs or Organizations: Not on file  . Attends Archivist Meetings: Not on file  . Marital Status: Not on file     Family History:  The patient's family history includes Hypertension in his father.   Review of  Systems:   Please see the history of present illness.     General:  No chills, fever, night sweats or weight changes.  Cardiovascular:  No chest pain, dyspnea on exertion, edema, orthopnea, palpitations, paroxysmal nocturnal dyspnea. Dermatological: No rash, lesions/masses Respiratory: Positive for dry cough. Urologic: No hematuria, dysuria Abdominal:   No nausea, vomiting, diarrhea, bright red blood per rectum, melena, or hematemesis Neurologic:  No visual changes, wkns, changes in mental status. All other systems reviewed and are otherwise negative except as noted above.   Physical Exam:    VS:  BP 120/70   Pulse 69   Ht 6' (1.829 m)   Wt 201 lb (91.2 kg)   SpO2 95%   BMI 27.26 kg/m    General: Well developed, well nourished,male appearing in no acute distress. Head: Normocephalic, atraumatic. Neck: No carotid bruits. JVD not elevated.  Lungs: Respirations regular and unlabored, without wheezes or rales.  Heart: Regular rate and rhythm. No S3 or S4.  No murmur, no rubs, or gallops appreciated. Abdomen: Appears non-distended. No obvious abdominal masses. Msk:  Strength and tone appear normal for age. No obvious joint deformities or effusions. Extremities: No clubbing or cyanosis. No lower extremity edema.  Distal pedal pulses are 2+ bilaterally. Neuro: Alert and oriented X 3. Moves all extremities spontaneously. No focal deficits noted. Psych:  Responds to questions appropriately with a normal affect. Skin: No rashes or lesions noted  Wt Readings from Last 3 Encounters:  07/28/20 201 lb (91.2 kg)  06/19/20 215 lb (97.5 kg)  03/14/20 190 lb (86.2 kg)     Studies/Labs Reviewed:   EKG:  EKG is ordered today.  The ekg ordered today demonstrates normal sinus rhythm, heart rate 69 with incomplete RBBB.  No acute ST abnormalities are compared to prior tracings.  Recent Labs: 06/19/2020: ALT 13; BUN 12; Creatinine, Ser 0.97; Hemoglobin 16.4; Platelets 208; Potassium 4.0; Sodium  139   Lipid Panel    Component Value Date/Time  CHOL 166 07/30/2018 0355   TRIG 100 07/30/2018 0355   HDL 37 (L) 07/30/2018 0355   CHOLHDL 4.5 07/30/2018 0355   VLDL 20 07/30/2018 0355   LDLCALC 109 (H) 07/30/2018 0355    Additional studies/ records that were reviewed today include:   Cardiac Catheterization: 07/2018  Mid LM to Dist LM lesion is 50% stenosed. 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  Ost Ramus lesion is 95% stenosed.  Lat Ramus lesion is 99% stenosed.  Dist RCA lesion is 100% stenosed.  Mid Cx lesion is 70% stenosed.  Prox LAD lesion is 60% stenosed.  The left ventricular systolic function is normal.  LV end diastolic pressure is normal. LVEDP 6 mm Hg.  The left ventricular ejection fraction is 55-65% by visual estimate.  There is no aortic valve stenosis.   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   Limited Echo: 08/2018 Study Conclusions   - Left ventricle: The cavity size was normal. Systolic function was  normal. The estimated ejection fraction was in the range of 55%  to 60%. Wall motion was normal; there were no regional wall  motion abnormalities. Features are consistent with a pseudonormal  left ventricular filling pattern, with concomitant abnormal  relaxation and increased filling pressure (grade 2 diastolic  dysfunction).  - Aortic valve: Valve mobility was restricted. Transvalvular  velocity was within the normal range. There was no stenosis.  There was no regurgitation.  - Mitral valve: Transvalvular velocity was within the normal range.  There was no evidence for stenosis. There was mild regurgitation.  - Right ventricle: The cavity size was normal. Wall thickness was  normal. Systolic  function was normal.  - Atrial septum: No defect or patent foramen ovale was identified  by color flow Doppler.  - Tricuspid valve: There was mild regurgitation.  Carotid Dopplers: 09/2019 Summary:  Right Carotid: Velocities in the right ICA are consistent with a 80-99%         stenosis.   Left Carotid: Velocities in the left ICA are consistent with a 1-39%  stenosis.   Assessment:    1. Coronary artery disease involving native coronary artery of native heart without angina pectoris   2. Hyperlipidemia LDL goal <70   3. Essential hypertension   4. Bilateral carotid artery stenosis      Plan:   In order of problems listed above:  1. CAD - He is s/p CABG in 08/2018 with LIMA-LAD and seq SVG to medial and lateral branches of RI. He remains active at baseline and denies any recent anginal symptoms.  - Continue current medication regimen with ASA 81mg  daily, Atorvastatin 80mg  daily and Lopressor 25mg  BID.   2. HLD - Followed by his PCP. Recently underwent labs per his report and I will request a copy of these. Goal LDL is less than 70 with known CAD. Remains on Atorvastatin 80mg  daily.   3. HTN - BP is well-controlled at 120/70 during today's visit. He is currently on Amlodipine 5mg  daily, Lisinopril 2.5mg  daily and Lopressor 25mg  BID. He reports a dry cough for several months and I am concerned Lisinopril might be the culprit as his lungs are clear on examination. Given the small dose of Lisinopril he is on, I have asked him to  stop this for now and to see if symptoms improve. He was provided with a BP log to follow readings at home. If BP becomes elevated, can further titrate Amlodipine.   4. Carotid Artery Stenosis - He is s/p R CEA in 10/2019 and is due for follow-up with Vascular Surgery and repeat carotids in the coming months. Was encouraged to keep follow-up.    Medication Adjustments/Labs and Tests Ordered: Current medicines are reviewed at length with the  patient today.  Concerns regarding medicines are outlined above.  Medication changes, Labs and Tests ordered today are listed in the Patient Instructions below. Patient Instructions  Medication Instructions:  Your physician recommends that you continue on your current medications as directed. Please refer to the Current Medication list given to you today.  Stop Taking Lisinopril to see if your cough improves.   Monitor Blood pressure for one week and drop off log at office.   *If you need a refill on your cardiac medications before your next appointment, please call your pharmacy*   Lab Work: NONE   If you have labs (blood work) drawn today and your tests are completely normal, you will receive your results only by: Marland Kitchen MyChart Message (if you have MyChart) OR . A paper copy in the mail If you have any lab test that is abnormal or we need to change your treatment, we will call you to review the results.   Testing/Procedures: NONE   Follow-Up: At [facility], you and your health needs are our priority.  As part of our continuing mission to provide you with exceptional heart care, we have created designated Provider Care Teams.  These Care Teams include your primary Cardiologist (physician) and Advanced Practice Providers (APPs -  Physician Assistants and Nurse Practitioners) who all work together to provide you with the care you need, when you need it.  We recommend signing up for the patient portal called "MyChart".  Sign up information is provided on this After Visit Summary.  MyChart is used to connect with patients for Virtual Visits (Telemedicine).  Patients are able to view lab/test results, encounter notes, upcoming appointments, etc.  Non-urgent messages can be sent to your provider as well.   To learn more about what you can do with MyChart, go to [email or web address].    Your next appointment:   6 month(s)  The format for your next appointment:   In Person  Provider:    Carlyle Dolly, MD   Other Instructions Thank you for choosing [facility]!       Signed, Erma Heritage, PA-C  07/29/2020 7:57 AM    [facility] [address] [place], [place] [address] Phone: [phone] Fax: [phone]

## 2020-07-28 NOTE — Patient Instructions (Signed)
Medication Instructions:  Your physician recommends that you continue on your current medications as directed. Please refer to the Current Medication list given to you today.  Stop Taking Lisinopril to see if your cough improves.   Monitor Blood pressure for one week and drop off log at office.   *If you need a refill on your cardiac medications before your next appointment, please call your pharmacy*   Lab Work: NONE   If you have labs (blood work) drawn today and your tests are completely normal, you will receive your results only by: Marland Kitchen MyChart Message (if you have MyChart) OR . A paper copy in the mail If you have any lab test that is abnormal or we need to change your treatment, we will call you to review the results.   Testing/Procedures: NONE   Follow-Up: At [facility], you and your health needs are our priority.  As part of our continuing mission to provide you with exceptional heart care, we have created designated Provider Care Teams.  These Care Teams include your primary Cardiologist (physician) and Advanced Practice Providers (APPs -  Physician Assistants and Nurse Practitioners) who all work together to provide you with the care you need, when you need it.  We recommend signing up for the patient portal called "MyChart".  Sign up information is provided on this After Visit Summary.  MyChart is used to connect with patients for Virtual Visits (Telemedicine).  Patients are able to view lab/test results, encounter notes, upcoming appointments, etc.  Non-urgent messages can be sent to your provider as well.   To learn more about what you can do with MyChart, go to [email or web address].    Your next appointment:   6 month(s)  The format for your next appointment:   In Person  Provider:   Carlyle Dolly, MD   Other Instructions Thank you for choosing [facility]!

## 2020-07-29 ENCOUNTER — Encounter: Payer: Self-pay | Admitting: Student

## 2020-10-10 ENCOUNTER — Ambulatory Visit: Payer: Medicare HMO | Admitting: Physician Assistant

## 2020-11-10 ENCOUNTER — Other Ambulatory Visit: Payer: Self-pay

## 2020-11-10 MED ORDER — METOPROLOL TARTRATE 25 MG PO TABS: 25.0000 mg | ORAL_TABLET | Freq: Two times a day (BID) | ORAL | 3 refills | Status: DC

## 2020-11-10 MED ORDER — ATORVASTATIN CALCIUM 80 MG PO TABS: 80.0000 mg | ORAL_TABLET | Freq: Every day | ORAL | 3 refills | Status: DC

## 2020-11-10 MED ORDER — ATORVASTATIN CALCIUM 80 MG PO TABS: 80.0000 mg | ORAL_TABLET | Freq: Every day | ORAL | 2 refills | Status: DC

## 2020-11-14 ENCOUNTER — Other Ambulatory Visit: Payer: Self-pay

## 2020-11-14 MED ORDER — METOPROLOL TARTRATE 25 MG PO TABS: 25.0000 mg | ORAL_TABLET | Freq: Two times a day (BID) | ORAL | 3 refills | Status: DC

## 2021-01-09 ENCOUNTER — Other Ambulatory Visit: Payer: Self-pay

## 2021-01-09 ENCOUNTER — Ambulatory Visit (INDEPENDENT_AMBULATORY_CARE_PROVIDER_SITE_OTHER): Payer: Medicare HMO | Admitting: Cardiology

## 2021-01-09 ENCOUNTER — Encounter: Payer: Self-pay | Admitting: Cardiology

## 2021-01-09 VITALS — BP 148/92 | HR 68 | Ht 72.0 in | Wt 214.0 lb

## 2021-01-09 DIAGNOSIS — E782 Mixed hyperlipidemia: Secondary | ICD-10-CM

## 2021-01-09 DIAGNOSIS — I2510 Atherosclerotic heart disease of native coronary artery without angina pectoris: Secondary | ICD-10-CM

## 2021-01-09 DIAGNOSIS — I6521 Occlusion and stenosis of right carotid artery: Secondary | ICD-10-CM | POA: Diagnosis not present

## 2021-01-09 DIAGNOSIS — I10 Essential (primary) hypertension: Secondary | ICD-10-CM

## 2021-01-09 MED ORDER — AMLODIPINE BESYLATE 10 MG PO TABS: 10.0000 mg | ORAL_TABLET | Freq: Every day | ORAL | 3 refills | Status: DC

## 2021-01-09 NOTE — Patient Instructions (Signed)
Medication Instructions:  STOP Lisinopril    INCREASE Amlodipine to 10 mg daily    *If you need a refill on your cardiac medications before your next appointment, please call your pharmacy*   Lab Work: None today If you have labs (blood work) drawn today and your tests are completely normal, you will receive your results only by: Marland Kitchen MyChart Message (if you have MyChart) OR . A paper copy in the mail If you have any lab test that is abnormal or we need to change your treatment, we will call you to review the results.   Testing/Procedures: None today    Follow-Up: At [facility], you and your health needs are our priority.  As part of our continuing mission to provide you with exceptional heart care, we have created designated Provider Care Teams.  These Care Teams include your primary Cardiologist (physician) and Advanced Practice Providers (APPs -  Physician Assistants and Nurse Practitioners) who all work together to provide you with the care you need, when you need it.  We recommend signing up for the patient portal called "MyChart".  Sign up information is provided on this After Visit Summary.  MyChart is used to connect with patients for Virtual Visits (Telemedicine).  Patients are able to view lab/test results, encounter notes, upcoming appointments, etc.  Non-urgent messages can be sent to your provider as well.   To learn more about what you can do with MyChart, go to [email or web address].    Your next appointment:   6 month(s)  The format for your next appointment:   In Person  Provider:   Carlyle Dolly, MD   Other Instructions We have referred you to see Dr[facility]ark [facility].They will call you for an appointment.        Thank you for choosing [facility] !

## 2021-01-09 NOTE — Progress Notes (Signed)
Clinical Summary Timothy Simpson is a 75 y.o.male  1. CAD - history of CABG 08/2018 LIMA-distal LAD and seq-SVG-medial and lateral Meagan Spease of [place] - 08/2018 echo LVEF 55-60%, no WMAs, grade II diastolic dysfunction - has been on DAPT after CABG as he presented with NSTEMI  -no recent chest pain. No SOB or DOE - compliant with meds   2. Carotid stenosis - history of right CEA - was to follow up with vascular late last year, does not look like he did  3. HTN - compliant with meds  - cough last visit, lisnopril was stopped. Though he is unsure if stopped.    4. Hyperlipidemia - labs followed by pcp =- he is on statin  Past Medical History:  Diagnosis Date  . Coronary artery disease   . Gout   . High cholesterol   . Hypertension   . NSTEMI (non-ST elevated myocardial infarction) ([place]) 07/29/2018  . 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 Tobacco abuse      No Known Allergies   Current Outpatient Medications  Medication Sig Dispense Refill  . allopurinol (ZYLOPRIM) 100 MG tablet Take 100 mg by mouth daily.    Marland Kitchen amLODipine (NORVASC) 5 MG tablet Take 5 mg by mouth daily.    Marland Kitchen aspirin EC 81 MG EC tablet Take 1 tablet (81 mg total) by mouth daily.    Marland Kitchen atorvastatin (LIPITOR) 80 MG tablet Take 1 tablet (80 mg total) by mouth daily at 6 PM. 90 tablet 3  . cetirizine (ZYRTEC) 10 MG tablet Take 10 mg by mouth daily as needed for allergies.    Marland Kitchen donepezil (ARICEPT) 10 MG tablet Take 10 mg by mouth daily.    . ferrous sulfate 325 (65 FE) MG EC tablet Take 1 tablet (325 mg total) by mouth 2 (two) times daily. 60 tablet 3  . HYDROcodone-acetaminophen (NORCO) 5-325 MG tablet Take 1 tablet by mouth every 4 (four) hours as needed (coughing). 6 tablet 0  . hydroxypropyl methylcellulose / hypromellose (ISOPTO TEARS / GONIOVISC) 2.5 % ophthalmic solution Place 1 drop into  both eyes 2 (two) times daily as needed for dry eyes.     Marland Kitchen lisinopril (ZESTRIL) 2.5 MG tablet TAKE 1 TABLET BY MOUTH ONCE DAILY. 90 tablet 3  . memantine (NAMENDA) 5 MG tablet Take 5 mg by mouth 2 (two) times daily.    . metoprolol tartrate (LOPRESSOR) 25 MG tablet Take 1 tablet (25 mg total) by mouth 2 (two) times daily. 90 tablet 3  . ondansetron (ZOFRAN) 4 MG tablet Take 1 tablet (4 mg total) by mouth every 6 (six) hours. 12 tablet 0  . oxymetazoline (AFRIN) 0.05 % nasal spray Place 1 spray into both nostrils 2 (two) times daily as needed for congestion.     No current facility-administered medications for this visit.     Past Surgical History:  Procedure Laterality Date  . BACK SURGERY    . COLONOSCOPY  2004   Dr. Gala Simpson: Normal.  For history of tubulovillous adenoma back in 2001, five-year follow-up recommended.  . COLONOSCOPY  2009   Dr. Emerson Simpson: 6 mm polyp in the ascending segment and one in the sigmoid colon removed.  Pathology showed tubular adenomas.  5-year surveillance colonoscopy recommended.  . COLONOSCOPY N/A 07/07/2019   Procedure: COLONOSCOPY;  Surgeon: Timothy Dolin, MD;  [facility];  Service: Endoscopy;  Laterality: N/A;  1:00pm  . CORONARY ARTERY BYPASS GRAFT N/A 08/05/2018   Procedure: CORONARY ARTERY BYPASS GRAFTING (CABG) x3. Endoscopic Saphenous vein harvest.;  Surgeon: Timothy Alberts, MD;  Location: [place];  Service: Open Heart Surgery;  Laterality: N/A;  . ENDARTERECTOMY Right 10/18/2019   Procedure: ENDARTERECTOMY CAROTID;  Surgeon: Timothy Heck, MD;  Location: [place];  Service: Vascular;  Laterality: Right;  . INTRAVASCULAR PRESSURE WIRE/FFR STUDY N/A 08/04/2018   Procedure: INTRAVASCULAR PRESSURE WIRE/FFR STUDY;  Surgeon: Timothy Blanks, MD;  Location: [place] [facility];  Service: Cardiovascular;  Laterality: N/A;  . LEFT HEART CATH AND CORONARY ANGIOGRAPHY N/A 07/30/2018   Procedure: LEFT HEART CATH AND CORONARY ANGIOGRAPHY;  Surgeon:  Timothy Booze, MD;  Location: [place] [facility];  Service: Cardiovascular;  Laterality: N/A;  . PATCH ANGIOPLASTY Right 10/18/2019   Procedure: Patch Angioplasty;  Surgeon: Timothy Heck, MD;  Location: [facility];  Service: Vascular;  Laterality: Right;  . POLYPECTOMY  07/07/2019   Procedure: POLYPECTOMY;  Surgeon: Timothy Dolin, MD;  [facility];  Service: Endoscopy;;  . TEE WITHOUT CARDIOVERSION N/A 08/05/2018   Procedure: TRANSESOPHAGEAL ECHOCARDIOGRAM (TEE);  Surgeon: Timothy Alberts, MD;  Location: [place];  Service: Open Heart Surgery;  Laterality: N/A;     No Known Allergies    Family History  Problem Relation Age of Onset  . Hypertension Father      Social History Mr. Timothy Simpson reports that he quit smoking about 14 months ago. His smoking use included cigarettes. He has a 10.00 pack-year smoking history. He has never used smokeless tobacco. Mr. Timothy Simpson reports no history of alcohol use.   Review of Systems CONSTITUTIONAL: No weight loss, fever, chills, weakness or fatigue.  HEENT: Eyes: No visual loss, blurred vision, double vision or yellow sclerae.No hearing loss, sneezing, congestion, runny nose or sore throat.  SKIN: No rash or itching.  CARDIOVASCULAR: per hpi RESPIRATORY: No shortness of breath, cough or sputum.  GASTROINTESTINAL: No anorexia, nausea, vomiting or diarrhea. No abdominal pain or blood.  GENITOURINARY: No burning on urination, no polyuria NEUROLOGICAL: No headache, dizziness, syncope, paralysis, ataxia, numbness or tingling in the extremities. No change in bowel or bladder control.  MUSCULOSKELETAL: No muscle, back pain, joint pain or stiffness.  LYMPHATICS: No enlarged nodes. No history of splenectomy.  PSYCHIATRIC: No history of depression or anxiety.  ENDOCRINOLOGIC: No reports of sweating, cold or heat intolerance. No polyuria or polydipsia.  Marland Kitchen   Physical Examination Today's Vitals   01/09/21 1302  BP: (!) 148/92   Pulse: 68  SpO2: 95%  Weight: 214 lb (97.1 kg)  Height: 6' (1.829 m)   Body mass index is 29.02 kg/m.  Gen: resting comfortably, no acute distress HEENT: no scleral icterus, pupils equal round and reactive, no palptable cervical adenopathy,  CV: RRR, no m/r/g, no jvd Resp: Clear to auscultation bilaterally GI: abdomen is soft, non-tender, non-distended, normal bowel sounds, no hepatosplenomegaly MSK: extremities are warm, no edema.  Skin: warm, no rash Neuro:  no focal deficits Psych: appropriate affect   Diagnostic Studies  Cardiac Catheterization: 07/30/2018  Mid LM to Dist LM lesion is 50% stenosed. 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  Ost Ramus lesion is 95% stenosed.  Lat Ramus lesion is 99% stenosed.  Dist RCA lesion is 100% stenosed.  Mid Cx lesion is 70% stenosed.  Prox LAD lesion is 60% stenosed.  The left ventricular systolic function is  normal.  LV end diastolic pressure is normal. LVEDP 6 mm Hg.  The left ventricular ejection fraction is 55-65% by visual estimate.  There is no aortic valve stenosis.  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    Limited Echocardiogram: 08/10/2018 Study Conclusions  - Left ventricle: The cavity size was normal. Systolic function was normal. The estimated ejection fraction was in the range of 55% to 60%. Wall motion was normal; there were no regional wall motion abnormalities. Features are consistent with a pseudonormal left ventricular filling pattern, with concomitant abnormal relaxation and increased filling pressure (grade 2 diastolic dysfunction). - Aortic valve: Valve mobility was restricted. Transvalvular velocity was within the normal range. There was no  stenosis. There was no regurgitation. - Mitral valve: Transvalvular velocity was within the normal range. There was no evidence for stenosis. There was mild regurgitation. - Right ventricle: The cavity size was normal. Wall thickness was normal. Systolic function was normal. - Atrial septum: No defect or patent foramen ovale was identified by color flow Doppler. - Tricuspid valve: There was mild regurgitation.  Carotid Artery Stenosis: 08/2018 IMPRESSION: Right greater than left carotid atherosclerosis.  Right ICA stenosis estimated greater than 70% by ultrasound criteria  Left ICA narrowing less than 50%.  Assessment and Plan   1. CAD - no symptoms, continue current meds  2. HTN - bp above goal - increase norvasc to 10mg  daily. Cough on ACE-I, considered changing his norvac to ARB in setting of vascular disease but he has some confusion about his meds already and would avoid multiple changes at once.   3. Carotid US - reestablish with vascular, continue medical therapy  4. Hyperlipidemia - continue, statin, request pcp labs  F/u 6 months    Arnoldo Lenis, M.D.

## 2021-02-20 ENCOUNTER — Ambulatory Visit: Payer: Medicare HMO | Admitting: Vascular Surgery

## 2021-02-20 ENCOUNTER — Encounter (HOSPITAL_COMMUNITY): Payer: Medicare HMO

## 2021-03-01 ENCOUNTER — Other Ambulatory Visit: Payer: Self-pay

## 2021-03-01 DIAGNOSIS — I6523 Occlusion and stenosis of bilateral carotid arteries: Secondary | ICD-10-CM

## 2021-03-13 ENCOUNTER — Ambulatory Visit: Payer: Medicare HMO | Admitting: Vascular Surgery

## 2021-03-13 ENCOUNTER — Ambulatory Visit (HOSPITAL_COMMUNITY): Payer: Medicare HMO

## 2021-04-03 ENCOUNTER — Encounter: Payer: Self-pay | Admitting: Vascular Surgery

## 2021-04-03 ENCOUNTER — Ambulatory Visit (HOSPITAL_COMMUNITY)
Admission: RE | Admit: 2021-04-03 | Discharge: 2021-04-03 | Disposition: A | Discharge status: Home / Self Care | Payer: Medicare HMO | Source: Ambulatory Visit | Attending: Vascular Surgery | Admitting: Vascular Surgery

## 2021-04-03 ENCOUNTER — Ambulatory Visit: Payer: Medicare HMO | Admitting: Vascular Surgery

## 2021-04-03 ENCOUNTER — Other Ambulatory Visit: Payer: Self-pay

## 2021-04-03 VITALS — BP 116/72 | HR 73 | Temp 97.7°F | Resp 18 | Ht 72.0 in | Wt 210.0 lb

## 2021-04-03 DIAGNOSIS — I6521 Occlusion and stenosis of right carotid artery: Secondary | ICD-10-CM | POA: Diagnosis not present

## 2021-04-03 DIAGNOSIS — I6523 Occlusion and stenosis of bilateral carotid arteries: Secondary | ICD-10-CM | POA: Insufficient documentation

## 2021-04-03 NOTE — Progress Notes (Signed)
Patient name: Timothy Simpson MRN: [id number] DOB: May 29, 1946 Sex: male  REASON FOR VISIT: 1 year follow-up for carotid artery surveillance  HPI: Timothy Simpson is a 75 y.o. male with history of CAD and NSTEMI, hypertension, hyperlipidemia that presents for 1 year follow-up of his carotid artery disease.  He is well-known to me and previously had a right carotid endarterectomy on 10/18/2019 for asymptomatic high-grade stenosis.  He reports no new issues on follow-up today.  He's had no neurologic events at home.  States overall he feels good.  States he has quit smoking in the interim.  Past Medical History:  Diagnosis Date  . Coronary artery disease   . Gout   . High cholesterol   . Hypertension   . NSTEMI (non-ST elevated myocardial infarction) ([place]) 07/29/2018  . 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 Tobacco abuse     Past Surgical History:  Procedure Laterality Date  . BACK SURGERY    . COLONOSCOPY  2004   Dr. Gala Romney: Normal.  For history of tubulovillous adenoma back in 2001, five-year follow-up recommended.  . COLONOSCOPY  2009   Dr. Emerson Monte: 6 mm polyp in the ascending segment and one in the sigmoid colon removed.  Pathology showed tubular adenomas.  5-year surveillance colonoscopy recommended.  . COLONOSCOPY N/A 07/07/2019   Procedure: COLONOSCOPY;  Surgeon: Daneil Dolin, MD;  [facility];  Service: Endoscopy;  Laterality: N/A;  1:00pm  . CORONARY ARTERY BYPASS GRAFT N/A 08/05/2018   Procedure: CORONARY ARTERY BYPASS GRAFTING (CABG) x3. Endoscopic Saphenous vein harvest.;  Surgeon: Rexene Alberts, MD;  Location: [place];  Service: Open Heart Surgery;  Laterality: N/A;  . ENDARTERECTOMY Right 10/18/2019   Procedure: ENDARTERECTOMY CAROTID;  Surgeon: Marty Heck, MD;  Location: [place];  Service: Vascular;  Laterality: Right;  .  INTRAVASCULAR PRESSURE WIRE/FFR STUDY N/A 08/04/2018   Procedure: INTRAVASCULAR PRESSURE WIRE/FFR STUDY;  Surgeon: Burnell Blanks, MD;  Location: [place] [facility];  Service: Cardiovascular;  Laterality: N/A;  . LEFT HEART CATH AND CORONARY ANGIOGRAPHY N/A 07/30/2018   Procedure: LEFT HEART CATH AND CORONARY ANGIOGRAPHY;  Surgeon: Jettie Booze, MD;  Location: [place] [facility];  Service: Cardiovascular;  Laterality: N/A;  . PATCH ANGIOPLASTY Right 10/18/2019   Procedure: Patch Angioplasty;  Surgeon: Marty Heck, MD;  Location: [place];  Service: Vascular;  Laterality: Right;  . POLYPECTOMY  07/07/2019   Procedure: POLYPECTOMY;  Surgeon: Daneil Dolin, MD;  [facility];  Service: Endoscopy;;  . TEE WITHOUT CARDIOVERSION N/A 08/05/2018   Procedure: TRANSESOPHAGEAL ECHOCARDIOGRAM (TEE);  Surgeon: Rexene Alberts, MD;  Location: [facility];  Service: Open Heart Surgery;  Laterality: N/A;    Family History  Problem Relation Age of Onset  . Hypertension Father     SOCIAL HISTORY: Social History   Tobacco Use  . Smoking status: Former Smoker    Packs/day: 0.25    Years: 40.00    Pack years: 10.00    Types: Cigarettes    Quit date: 11/01/2019    Years since quitting: 1.4  . Smokeless tobacco: Never Used  . Tobacco comment: he sated that he would quit if he was told he had to.   Substance Use Topics  . Alcohol use: No    No Known Allergies  Current Outpatient Medications  Medication Sig  Dispense Refill  . allopurinol (ZYLOPRIM) 100 MG tablet Take 100 mg by mouth daily.    Marland Kitchen amLODipine (NORVASC) 10 MG tablet Take 1 tablet (10 mg total) by mouth daily. 90 tablet 3  . aspirin EC 81 MG EC tablet Take 1 tablet (81 mg total) by mouth daily.    Marland Kitchen atorvastatin (LIPITOR) 80 MG tablet Take 1 tablet (80 mg total) by mouth daily at 6 PM. 90 tablet 3  . cetirizine (ZYRTEC) 10 MG tablet Take 10 mg by mouth daily as needed for allergies.    Marland Kitchen donepezil (ARICEPT) 10 MG  tablet Take 10 mg by mouth daily.    Marland Kitchen HYDROcodone-acetaminophen (NORCO) 5-325 MG tablet Take 1 tablet by mouth every 4 (four) hours as needed (coughing). 6 tablet 0  . hydroxypropyl methylcellulose / hypromellose (ISOPTO TEARS / GONIOVISC) 2.5 % ophthalmic solution Place 1 drop into both eyes 2 (two) times daily as needed for dry eyes.     . memantine (NAMENDA) 5 MG tablet Take 5 mg by mouth 2 (two) times daily.    . metoprolol tartrate (LOPRESSOR) 25 MG tablet Take 1 tablet (25 mg total) by mouth 2 (two) times daily. 90 tablet 3  . ondansetron (ZOFRAN) 4 MG tablet Take 1 tablet (4 mg total) by mouth every 6 (six) hours. 12 tablet 0  . oxymetazoline (AFRIN) 0.05 % nasal spray Place 1 spray into both nostrils 2 (two) times daily as needed for congestion.    . ferrous sulfate 325 (65 FE) MG EC tablet Take 1 tablet (325 mg total) by mouth 2 (two) times daily. 60 tablet 3   No current facility-administered medications for this visit.    REVIEW OF SYSTEMS:  [X]  denotes positive finding, [ ]  denotes negative finding Cardiac  Comments:  Chest pain or chest pressure:    Shortness of breath upon exertion:    Short of breath when lying flat:    Irregular heart rhythm:        Vascular    Pain in calf, thigh, or hip brought on by ambulation:    Pain in feet at night that wakes you up from your sleep:     Blood clot in your veins:    Leg swelling:         Pulmonary    Oxygen at home:    Productive cough:     Wheezing:         Neurologic    Sudden weakness in arms or legs:     Sudden numbness in arms or legs:     Sudden onset of difficulty speaking or slurred speech:    Temporary loss of vision in one eye:     Problems with dizziness:         Gastrointestinal    Blood in stool:     Vomited blood:         Genitourinary    Burning when urinating:     Blood in urine:        Psychiatric    Major depression:         Hematologic    Bleeding problems:    Problems with blood clotting  too easily:        Skin    Rashes or ulcers:        Constitutional    Fever or chills:      PHYSICAL EXAM: Vitals:   04/03/21 1526 04/03/21 1528  BP: 120/77 116/72  Pulse: 75 73  Resp: 18  Temp: 97.7 F (36.5 C)   TempSrc: Temporal   SpO2: 95%   Weight: 210 lb (95.3 kg)   Height: 6' (1.829 m)     GENERAL: The patient is a well-nourished male, in no acute distress. The vital signs are documented above. CARDIAC: There is a regular rate and rhythm.  VASCULAR:  Right neck incision well-healed.   NEUROLOGIC: No focal weakness or paresthesias are detected.  Cranial nerves II through XII grossly intact.  DATA:   Carotid duplex today shows minimal carotid disease bilaterally with ICA velocities consistent with 1 to 39% stenosis bilaterally.  Assessment/Plan:  75 year old male presents for 1 year follow-up and ongoing surveillance of his carotid artery disease.  As noted above he is status post right carotid endarterectomy on 10/18/2019 for asymptomatic high-grade stenosis.  Overall he is doing very well.  He has minimal disease on duplex today and right endarterectomy site remains patent with no significant recurrent stenosis.  I discussed we will follow-up in 1 year with carotid duplex for continued surveillance.  Marty Heck, MD [facility] Office: [place]

## 2021-06-13 ENCOUNTER — Other Ambulatory Visit: Payer: Self-pay | Admitting: Cardiology

## 2021-07-11 ENCOUNTER — Ambulatory Visit: Payer: Medicare HMO | Admitting: Cardiology

## 2021-09-01 ENCOUNTER — Other Ambulatory Visit: Payer: Self-pay | Admitting: Cardiology

## 2021-11-08 ENCOUNTER — Telehealth: Payer: Self-pay | Admitting: Cardiology

## 2021-11-08 ENCOUNTER — Encounter: Payer: Self-pay | Admitting: Cardiology

## 2021-11-08 ENCOUNTER — Ambulatory Visit: Payer: Medicare HMO | Admitting: Cardiology

## 2021-11-08 VITALS — BP 92/64 | HR 65 | Ht 73.0 in | Wt 203.6 lb

## 2021-11-08 DIAGNOSIS — I10 Essential (primary) hypertension: Secondary | ICD-10-CM

## 2021-11-08 DIAGNOSIS — I6523 Occlusion and stenosis of bilateral carotid arteries: Secondary | ICD-10-CM | POA: Diagnosis not present

## 2021-11-08 DIAGNOSIS — I2510 Atherosclerotic heart disease of native coronary artery without angina pectoris: Secondary | ICD-10-CM | POA: Diagnosis not present

## 2021-11-08 DIAGNOSIS — E782 Mixed hyperlipidemia: Secondary | ICD-10-CM | POA: Diagnosis not present

## 2021-11-08 MED ORDER — AMLODIPINE BESYLATE 5 MG PO TABS: 5.0000 mg | ORAL_TABLET | Freq: Every day | ORAL | 3 refills | Status: DC

## 2021-11-08 NOTE — Telephone Encounter (Signed)
To mail order, amlodipine 5 mg #90 [id number]:3

## 2021-11-08 NOTE — Progress Notes (Signed)
Clinical Summary Mr. Vi is a 75 y.o.male seen today for follow up of the following medical problems.   1. CAD - history of CABG 08/2018 LIMA-distal LAD and seq-SVG-medial and lateral Deasia Chiu of [place] - 08/2018 echo LVEF 55-60%, no WMAs, grade II diastolic dysfunction - has been on DAPT after CABG as he presented with NSTEMI   -no recent chest pains. No SOB/DOE - compliant with meds      2. Carotid stenosis - history of right CEA - last appt with Dr Carlis Abbott 04/2021   3. HTN - compliant with meds   - cough last visit, lisnopril was stopped. - compliant with meds      4. 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     amLODipine (NORVASC) 10 MG tablet Take 1 tablet (10 mg total) by mouth daily. 90 tablet 3   aspirin EC 81 MG EC tablet Take 1 tablet (81 mg total) by mouth daily.     atorvastatin (LIPITOR) 80 MG tablet TAKE 1 TABLET EVERY DAY AT 6PM 90 tablet 3   cetirizine (ZYRTEC) 10 MG tablet Take 10 mg by mouth daily as needed for allergies.     donepezil (ARICEPT) 10 MG tablet Take 10 mg by mouth daily.     ferrous sulfate 325 (65 FE) MG EC tablet Take 1 tablet (325 mg total) by mouth 2 (two) times daily. 60 tablet 3   HYDROcodone-acetaminophen (NORCO) 5-325 MG tablet Take 1 tablet by mouth every 4 (four) hours as needed (coughing). 6 tablet 0   hydroxypropyl methylcellulose / hypromellose (ISOPTO TEARS / GONIOVISC) 2.5 % ophthalmic solution Place 1 drop  into both eyes 2 (two) times daily as needed for dry eyes.      memantine (NAMENDA) 5 MG tablet Take 5 mg by mouth 2 (two) times daily.     metoprolol tartrate (LOPRESSOR) 25 MG tablet TAKE 1 TABLET TWICE DAILY 180 tablet 1   ondansetron (ZOFRAN) 4 MG tablet Take 1 tablet (4 mg total) by mouth every 6 (six) hours. 12 tablet 0   oxymetazoline (AFRIN) 0.05 % nasal spray Place 1 spray into both nostrils 2 (two) times daily as needed for congestion.     No current facility-administered medications for this visit.     Past Surgical History:  Procedure Laterality Date   BACK SURGERY     COLONOSCOPY  2004   Dr. Gala Romney: Normal.  For history of tubulovillous adenoma back in 2001, five-year follow-up recommended.   COLONOSCOPY  2009   Dr. Emerson Monte: 6 mm polyp in the ascending segment and one in the sigmoid colon removed.  Pathology showed tubular adenomas.  5-year surveillance colonoscopy recommended.   COLONOSCOPY N/A 07/07/2019   Procedure: COLONOSCOPY;  Surgeon: Daneil Dolin, MD;  [facility];  Service: Endoscopy;  Laterality: N/A;  1:00pm   CORONARY ARTERY BYPASS GRAFT N/A 08/05/2018  Procedure: CORONARY ARTERY BYPASS GRAFTING (CABG) x3. Endoscopic Saphenous vein harvest.;  Surgeon: Rexene Alberts, MD;  Location: [place];  Service: Open Heart Surgery;  Laterality: N/A;   ENDARTERECTOMY Right 10/18/2019   Procedure: ENDARTERECTOMY CAROTID;  Surgeon: Marty Heck, MD;  Location: [place];  Service: Vascular;  Laterality: Right;   INTRAVASCULAR PRESSURE WIRE/FFR STUDY N/A 08/04/2018   Procedure: INTRAVASCULAR PRESSURE WIRE/FFR STUDY;  Surgeon: Burnell Blanks, MD;  Location: [facility];  Service: Cardiovascular;  Laterality: N/A;   LEFT HEART CATH AND CORONARY ANGIOGRAPHY N/A 07/30/2018   Procedure: LEFT HEART CATH AND CORONARY ANGIOGRAPHY;  Surgeon: Jettie Booze, MD;  Location: [facility];  Service: Cardiovascular;  Laterality: N/A;   PATCH ANGIOPLASTY  Right 10/18/2019   Procedure: Patch Angioplasty;  Surgeon: Marty Heck, MD;  Location: [place];  Service: Vascular;  Laterality: Right;   POLYPECTOMY  07/07/2019   Procedure: POLYPECTOMY;  Surgeon: Daneil Dolin, MD;  [facility];  Service: Endoscopy;;   TEE WITHOUT CARDIOVERSION N/A 08/05/2018   Procedure: TRANSESOPHAGEAL ECHOCARDIOGRAM (TEE);  Surgeon: Rexene Alberts, MD;  Location: [place];  Service: Open Heart Surgery;  Laterality: N/A;     No Known Allergies    Family History  Problem Relation Age of Onset   Hypertension Father      Social History Mr. Troia reports that he quit smoking about 2 years ago. His smoking use included cigarettes. He has a 10.00 pack-year smoking history. He has never used smokeless tobacco. Mr. Manrique reports no history of alcohol use.   Review of Systems CONSTITUTIONAL: No weight loss, fever, chills, weakness or fatigue.  HEENT: Eyes: No visual loss, blurred vision, double vision or yellow sclerae.No hearing loss, sneezing, congestion, runny nose or sore throat.  SKIN: No rash or itching.  CARDIOVASCULAR: per hpi RESPIRATORY: No shortness of breath, cough or sputum.  GASTROINTESTINAL: No anorexia, nausea, vomiting or diarrhea. No abdominal pain or blood.  GENITOURINARY: No burning on urination, no polyuria NEUROLOGICAL: No headache, dizziness, syncope, paralysis, ataxia, numbness or tingling in the extremities. No change in bowel or bladder control.  MUSCULOSKELETAL: No muscle, back pain, joint pain or stiffness.  LYMPHATICS: No enlarged nodes. No history of splenectomy.  PSYCHIATRIC: No history of depression or anxiety.  ENDOCRINOLOGIC: No reports of sweating, cold or heat intolerance. No polyuria or polydipsia.  Marland Kitchen   Physical Examination Today's Vitals   11/08/21 0820  BP: 92/64  Pulse: 65  SpO2: 95%  Weight: 203 lb 9.6 oz (92.4 kg)  Height: 6\' 1"  (1.854 m)   Body mass index is 26.86 kg/m.  Gen: resting  comfortably, no acute distress HEENT: no scleral icterus, pupils equal round and reactive, no palptable cervical adenopathy,  CV: RRR, no m/r/g no jvd Resp: Clear to auscultation bilaterally GI: abdomen is soft, non-tender, non-distended, normal bowel sounds, no hepatosplenomegaly MSK: extremities are warm, no edema.  Skin: warm, no rash Neuro:  no focal deficits Psych: appropriate affect   Diagnostic Studies  Cardiac Catheterization: 07/30/2018 Mid LM to Dist LM lesion is 50% stenosed. 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 Ost Ramus lesion is 95% stenosed. Lat Ramus lesion is 99% stenosed. Dist RCA lesion is 100% stenosed. Mid Cx lesion is 70% stenosed. Prox LAD lesion is 60% stenosed. The left ventricular systolic function is normal. LV end diastolic pressure is normal. LVEDP 6 mm Hg. The left ventricular ejection fraction is 55-65% by visual estimate. There is no  aortic valve stenosis.   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      Limited Echocardiogram: 08/10/2018 Study Conclusions   - Left ventricle: The cavity size was normal. Systolic function was   normal. The estimated ejection fraction was in the range of 55%   to 60%. Wall motion was normal; there were no regional wall   motion abnormalities. Features are consistent with a pseudonormal   left ventricular filling pattern, with concomitant abnormal   relaxation and increased filling pressure (grade 2 diastolic   dysfunction). - Aortic valve: Valve mobility was restricted. Transvalvular   velocity was within the normal range. There was no stenosis.   There was no regurgitation. - Mitral valve: Transvalvular velocity was within the normal range.   There was no evidence for stenosis.  There was mild regurgitation. - Right ventricle: The cavity size was normal. Wall thickness was   normal. Systolic function was normal. - Atrial septum: No defect or patent foramen ovale was identified   by color flow Doppler. - Tricuspid valve: There was mild regurgitation.   Carotid Artery Stenosis: 08/2018 IMPRESSION: Right greater than left carotid atherosclerosis.   Right ICA stenosis estimated greater than 70% by ultrasound criteria   Left ICA narrowing less than 50%.   Assessment and Plan   1. CAD - no recent symptoms, continue current meds   2. HTN - manual bp 100/60, will lower norvasc to 5mg  daily.    3. Carotid US - continue medical therapy   4. Hyperlipidemia - request pcp labs, continue statin.      Arnoldo Lenis, M.D.

## 2021-11-08 NOTE — Telephone Encounter (Signed)
Please send Rx refills to [facility] mail order instead of [employer]

## 2021-11-08 NOTE — Patient Instructions (Signed)
Medication Instructions:  Your physician has recommended you make the following change in your medication:  DECREASE Norvasc to 5 mg tablets daily   Labwork: We have requested your most recent labs from your primary care provider  Testing/Procedures: None  Follow-Up: One year follow up with Dr. Harl Bowie  Any Other Special Instructions Will Be Listed Below (If Applicable).     If you need a refill on your cardiac medications before your next appointment, please call your pharmacy

## 2022-01-12 ENCOUNTER — Other Ambulatory Visit: Payer: Self-pay | Admitting: Cardiology

## 2022-03-23 ENCOUNTER — Emergency Department (HOSPITAL_COMMUNITY)
Admission: EM | Admit: 2022-03-23 | Discharge: 2022-03-23 | Disposition: A | Discharge status: Home / Self Care | Payer: Medicare HMO | Attending: Emergency Medicine | Admitting: Emergency Medicine

## 2022-03-23 ENCOUNTER — Encounter (HOSPITAL_COMMUNITY): Payer: Self-pay

## 2022-03-23 ENCOUNTER — Other Ambulatory Visit: Payer: Self-pay

## 2022-03-23 DIAGNOSIS — S41102A Unspecified open wound of left upper arm, initial encounter: Secondary | ICD-10-CM | POA: Diagnosis not present

## 2022-03-23 DIAGNOSIS — S4992XA Unspecified injury of left shoulder and upper arm, initial encounter: Secondary | ICD-10-CM | POA: Diagnosis present

## 2022-03-23 DIAGNOSIS — Z79899 Other long term (current) drug therapy: Secondary | ICD-10-CM | POA: Insufficient documentation

## 2022-03-23 DIAGNOSIS — D179 Benign lipomatous neoplasm, unspecified: Secondary | ICD-10-CM | POA: Insufficient documentation

## 2022-03-23 DIAGNOSIS — Z951 Presence of aortocoronary bypass graft: Secondary | ICD-10-CM | POA: Insufficient documentation

## 2022-03-23 DIAGNOSIS — X58XXXA Exposure to other specified factors, initial encounter: Secondary | ICD-10-CM | POA: Diagnosis not present

## 2022-03-23 DIAGNOSIS — I10 Essential (primary) hypertension: Secondary | ICD-10-CM | POA: Diagnosis not present

## 2022-03-23 DIAGNOSIS — Z7982 Long term (current) use of aspirin: Secondary | ICD-10-CM | POA: Insufficient documentation

## 2022-03-23 DIAGNOSIS — I2510 Atherosclerotic heart disease of native coronary artery without angina pectoris: Secondary | ICD-10-CM | POA: Insufficient documentation

## 2022-03-23 DIAGNOSIS — D1722 Benign lipomatous neoplasm of skin and subcutaneous tissue of left arm: Secondary | ICD-10-CM

## 2022-03-23 MED ORDER — POVIDONE-IODINE 10 % EX SOLN
CUTANEOUS | Status: AC
  Filled 2022-03-23: qty 29.6

## 2022-03-23 MED ORDER — BACITRACIN ZINC 500 UNIT/GM EX OINT
TOPICAL_OINTMENT | Freq: Once | CUTANEOUS | Status: AC
Start: 2022-03-23 — End: 2022-03-23
  Filled 2022-03-23: qty 0.9

## 2022-03-23 MED ORDER — CEPHALEXIN 500 MG PO CAPS: 500.0000 mg | ORAL_CAPSULE | Freq: Four times a day (QID) | ORAL | 0 refills | Status: DC

## 2022-03-23 MED ORDER — CEPHALEXIN 500 MG PO CAPS: 500.0000 mg | ORAL_CAPSULE | Freq: Four times a day (QID) | ORAL | 0 refills | Status: AC

## 2022-03-23 NOTE — ED Triage Notes (Signed)
Reports abscess to left axilla x 4 days ?

## 2022-03-23 NOTE — ED Notes (Signed)
ED Provider at bedside. 

## 2022-03-23 NOTE — Discharge Instructions (Addendum)
Antibiotic has been sent to your pharmacy.  Please take 1 capsule every 6 hours for the next 7 days.  Please take this to completion and always take with food. ? ?You have been provided with the contact information for a local general surgery provider by the name of Dr. Okey Dupre.  Please call and schedule an appointment within the next 3 to 5 days for consultation and reevaluation. ? ?Keep your follow-up with your primary care for reevaluation and continued medical management. ? ?Return to the ED for new or worsening symptoms as discussed. ?

## 2022-03-23 NOTE — ED Provider Notes (Signed)
?[facility] ?Provider Note ? ? ?CSN: [id number] ?Arrival date & time: 03/23/22  1618 ? ?  ? ?History ? ?Chief Complaint  ?Patient presents with  ? Abscess  ? ? ?CAI Timothy Simpson is a 76 y.o. male with chief complaint of an abscess of the left armpit over the last 4 days.  First noticed the feeling of a bump under his arm, followed up with primary care who provided him an antibiotic.  Antibiotic was sent to the wrong pharmacy, but the office was closed and was unable to have the antibiotic switched to different pharmacy.  Abscess then opened up on its own less than 2 days ago, and he has been noticing discharge on his clothes and bedding.  Accompanied with tenderness.  Denies fever, stiff neck, chills, or surrounding color change.  Pressure makes it worse, and has not tried anything to make it better.  Denies recent antibiotic use in the last 1 to 2 months. ? ?The history is provided by the patient, medical records and the spouse.  ?Abscess ? ?  ? ?Home Medications ?Prior to Admission medications   ?Medication Sig Start Date End Date Taking? Authorizing Provider  ?allopurinol (ZYLOPRIM) 100 MG tablet Take 100 mg by mouth daily. 04/28/18   [provider]  ?amLODipine (NORVASC) 5 MG tablet Take 1 tablet (5 mg total) by mouth daily. 11/08/21   Arnoldo Lenis, MD  ?aspirin EC 81 MG EC tablet Take 1 tablet (81 mg total) by mouth daily. 08/09/18   Nani Skillern, PA-C  ?atorvastatin (LIPITOR) 80 MG tablet TAKE 1 TABLET EVERY DAY AT 6PM ?Patient not taking: Reported on 11/08/2021 09/03/21   Arnoldo Lenis, MD  ?cetirizine (ZYRTEC) 10 MG tablet Take 10 mg by mouth daily as needed for allergies. ?Patient not taking: Reported on 11/08/2021    [provider]  ?donepezil (ARICEPT) 10 MG tablet Take 10 mg by mouth daily. ?Patient not taking: Reported on 11/08/2021 09/06/19   [provider]  ?ferrous sulfate 325 (65 FE) MG EC tablet Take 1 tablet (325 mg total) by mouth 2  (two) times daily. 08/11/18 11/08/21  Barrett, Erin R, PA-C  ?HYDROcodone-acetaminophen (NORCO) 5-325 MG tablet Take 1 tablet by mouth every 4 (four) hours as needed (coughing). ?Patient not taking: Reported on 64/03/346 03/26/94   Delora Fuel, MD  ?hydroxypropyl methylcellulose / hypromellose (ISOPTO TEARS / GONIOVISC) 2.5 % ophthalmic solution Place 1 drop into both eyes 2 (two) times daily as needed for dry eyes.     [provider]  ?memantine (NAMENDA) 5 MG tablet Take 5 mg by mouth 2 (two) times daily. 05/04/20   [provider]  ?metoprolol tartrate (LOPRESSOR) 25 MG tablet TAKE 1 TABLET TWICE DAILY 01/14/22   Arnoldo Lenis, MD  ?ondansetron (ZOFRAN) 4 MG tablet Take 1 tablet (4 mg total) by mouth every 6 (six) hours. ?Patient not taking: Reported on 11/08/2021 06/19/20   Virgel Manifold, MD  ?oxymetazoline (AFRIN) 0.05 % nasal spray Place 1 spray into both nostrils 2 (two) times daily as needed for congestion.    [provider]  ?   ? ?Allergies    ?Patient has no known allergies.   ? ?Review of Systems   ?Review of Systems  ?Skin:  Positive for wound.  ?     Open abscess of the left armpit  ? ?Physical Exam ?Updated Vital Signs ?BP (!) 116/59 (BP Location: Right Arm)   Pulse 70   Temp 98.2 ?  F (36.8 ?C) (Oral)   Resp 18   Ht '6\' 1"'$  (1.854 m)   Wt 92.1 kg   SpO2 100%   BMI 26.78 kg/m?  ?Physical Exam ?Vitals and nursing note reviewed.  ?Constitutional:   ?   General: He is not in acute distress. ?   Appearance: He is well-developed.  ?HENT:  ?   Head: Normocephalic and atraumatic.  ?Eyes:  ?   Conjunctiva/sclera: Conjunctivae normal.  ?Cardiovascular:  ?   Rate and Rhythm: Normal rate and regular rhythm.  ?   Heart sounds: No murmur heard. ?Pulmonary:  ?   Effort: Pulmonary effort is normal. No respiratory distress.  ?   Breath sounds: Normal breath sounds.  ?Abdominal:  ?   General: Bowel sounds are normal.  ?   Palpations: Abdomen is soft.  ?   Tenderness: There is no  abdominal tenderness.  ?Musculoskeletal:     ?   General: No swelling.  ?   Cervical back: Neck supple. No rigidity or tenderness.  ?Skin: ?   General: Skin is warm and dry.  ?   Capillary Refill: Capillary refill takes less than 2 seconds.  ? ?    ?   Comments: Open 2-3 cm wound of the left armpit with distinct borders and firm tissue on palpation, with leaking serosanguineous fluid.  No evidence of surrounding erythema or cellulitis, streaking, or tunneling  ?Neurological:  ?   Mental Status: He is alert and oriented to person, place, and time.  ?Psychiatric:     ?   Mood and Affect: Mood normal.  ? ? ?ED Results / Procedures / Treatments   ?Labs ?(all labs ordered are listed, but only abnormal results are displayed) ?Labs Reviewed - No data to display ? ?EKG ?None ? ?Radiology ?No results found. ? ?Procedures ?Procedures  ? ? ?Medications Ordered in ED ?Medications - No data to display ? ?ED Course/ Medical Decision Making/ A&P ?Clinical Course as of 03/24/22 0127  ?Sat Mar 23, 2525  ?3821 76 year old male here for growth under left arm and axilla.  Generally tender.  No obvious abscess looks more like a lipoma.  Will refer on to general surgery. [MB]  ?  ?Clinical Course User Index ?[MB] Hayden Rasmussen, MD  ? ?                        ?Medical Decision Making ?Amount and/or Complexity of Data Reviewed ?External Data Reviewed: notes. ?Labs:  Decision-making details documented in ED Course. ?Radiology:  Decision-making details documented in ED Course. ?ECG/medicine tests: ordered and independent interpretation performed. Decision-making details documented in ED Course. ? ?Risk ?OTC drugs. ?Prescription drug management. ? ? ?76 y.o. male presents to the ED for concern of Abscess ? Marland Kitchen  This involves an extensive number of treatment options, and is a complaint that carries with it a high risk of complications and morbidity.  The emergent differential diagnosis prior to evaluation includes, but is not limited to:  Exposed lipoma, open abscess, open wound with or without associated folliculitis or cellulitis ? ?This is not an exhaustive differential.  ? ?Past Medical History / Co-morbidities / Social History: ?Hypertension, hyperlipidemia, gout, chronic tobacco use, prior NSTEMI, CAD, prior CABG ?Social Determinants of Health include chronic tobacco use, for which cessation counseling was provided ? ?Additional History:  ?Internal and external records from outside source obtained and reviewed including cardiology, vascular surgery, prior ED visits ? ?Physical Exam: ?Physical exam performed.  The pertinent findings include: Overall unremarkable exam with the exception of an open wound of the left axilla.  Granulous firm tissue appreciated on exam that appears to be leaking serosanguineous fluid.  Mild tenderness to palpation.  No evidence of streaking or surrounding erythema/infection.  Does not appear to be an abscess, appears to be an exposed lipoma. ? ?Lab Tests: ?None ? ?Imaging Studies: ?None ? ?Medications: ?I ordered medication including bacitracin for topical antibiotic treatment.  Reevaluation of the patient after these medicines showed that the patient remarkable improvement and noted significant relief.  I have reviewed the patients home medicines and have made adjustments as needed ? ?ED Course/Disposition: ?Pt well-appearing on exam.  Exam overall unremarkable with the exception of a soft, firm, exposed mass with firm borders.  No evidence of surrounding or traveling infection.  Does not appear to be an abscess.  No evidence of folliculitis or cellulitis on exam.  Patient is afebrile without evidence of sepsis.  Physical exam and history suggestive of an exposed lipoma, that may have eroded through the skin due to pressure or friction.  I believe the patient is not a candidate for incision and drainage, and would best be served with follow-up consultation with general surgery.  Will provide patient with outpatient  prophylactic antibiotics.  Also recommend prompt referral with PCP for reevaluation and continued medical management in the meantime.  Provided wound care, including irrigation and cleaning of the wound, antibiotic oin

## 2022-04-02 ENCOUNTER — Ambulatory Visit: Payer: Medicare HMO | Admitting: General Surgery

## 2022-04-02 ENCOUNTER — Encounter: Payer: Self-pay | Admitting: General Surgery

## 2022-04-02 VITALS — BP 114/77 | HR 68 | Temp 97.7°F | Resp 14 | Ht 73.5 in | Wt 199.0 lb

## 2022-04-02 DIAGNOSIS — L732 Hidradenitis suppurativa: Secondary | ICD-10-CM | POA: Diagnosis not present

## 2022-04-02 NOTE — Patient Instructions (Signed)
Someone will call you with the ultrasound appointment. ?I think this is hidradenitis.  ?If the [place] is not concerning for anything else, will send in a clindamycin ointment you can use as needed. ?Neosporin and bandage to the area now.  ? ? ?We discussed the pathophysiology of hidradenitis and the likelihood that it involves blockages in hair follicles and sweat glands. We discussed that we do not know the exact cause but that this is what is suspected. We discussed that this is a spectrum of disease from small localized areas to extensive disease in the armpits, groin, between the breast and in other areas where one sweats.  One develops painful, blocked areas in these regions that can become infected and form sinus tracks or tunnels. These areas causing scarring and inflammation, and can leak pus and have an odor.  We discussed the risk factors such as obesity, smoking, metabolic syndrome/ diabetes.  There is also some link between genetics and immune system issues.   ? ?Given the spectrum of disease, some hidradenitis can improve with medical therapies and some need excision.  Excision of disease is best left for disease that is very well localized or that is so extensive and persistent that has failed to improve with other treatments.  Surgery for hidradenitis can be very disfiguring and painful. Large open wounds may be left in sensitive areas like the armpit and groin, and the hidradenitis can recur even after excision. Multiple excisions can be necessary as well as long-term wound care with months of packing and even skin grafts.  ? ?More recent studies are showing that medical treatments and lifestyle modifications may be more effective at treating this disease and cause less morbidity (pain and suffering) than a large surgical debridement.  We discussed exhausting these medical therapies including potentially biologics or immune modulators and discussion with a dermatologist prior to pursuing any surgical  intervention.  ? ?Hidradenitis Suppurativa ?Hidradenitis suppurativa is a long-term (chronic) skin disease. It is similar to a severe form of acne, but it affects areas of the body where acne would be unusual, especially areas of the body where skin rubs against skin and becomes moist. These include: ?Underarms. ?Groin. ?Genital area. ?Buttocks. ?Upper thighs. ?Breasts. ?Hidradenitis suppurativa may start out as small lumps or pimples caused by blocked sweat glands or hair follicles. Pimples may develop into deep sores that break open (rupture) and drain pus. Over time, affected areas of skin may thicken and become scarred. This condition is rare and does not spread from person to person (non-contagious). ?What are the causes? ?The exact cause of this condition is not known. It may be related to: ?Male and male hormones. ?An overactive disease-fighting system (immune system). The immune system may over-react to blocked hair follicles or sweat glands and cause swelling and pus-filled sores. ?What increases the risk? ?You are more likely to develop this condition if you: ?Are male. ?Are 33-48 years old. ?Have a family history of hidradenitis suppurativa. ?Have a personal history of acne. ?Are overweight. ?Smoke. ?Take the medicine lithium. ?What are the signs or symptoms? ?The first symptoms are usually painful bumps in the skin, similar to pimples. The condition may get worse over time (progress), or it may only cause mild symptoms. If the disease progresses, symptoms may include: ?Skin bumps getting bigger and growing deeper into the skin. ?Bumps rupturing and draining pus. ?Itchy, infected skin. ?Skin getting thicker and scarred. ?Tunnels under the skin (fistulas) where pus drains from a bump. ?Pain during daily  activities, such as pain during walking if your groin area is affected. ?Emotional problems, such as stress or depression. This condition may affect your appearance and your ability or willingness to  wear certain clothes or do certain activities. ?How is this diagnosed? ?This condition is diagnosed by a health care provider who specializes in skin diseases (dermatologist). You may be diagnosed based on: ?Your symptoms and medical history. ?A physical exam. ?Testing a pus sample for infection. ?Blood tests. ?How is this treated? ?Your treatment will depend on how severe your symptoms are. The same treatment will not work for everybody with this condition. You may need to try several treatments to find what works best for you. Treatment may include: ?Cleaning and bandaging (dressing) your wounds as needed. ?Lifestyle changes, such as new skin care routines. ?Taking medicines, such as: ?Antibiotics. ?Acne medicines. ?Medicines to reduce the activity of the immune system. ?A diabetes medicine (metformin). ?Birth control pills, for women. ?Steroids to reduce swelling and pain. ?Working with a mental health care provider, if you experience emotional distress due to this condition. ?If you have severe symptoms that do not get better with medicine, you may need surgery. Surgery may involve: ?Using a laser to clear the skin and remove hair follicles. ?Opening and draining deep sores. ?Removing the areas of skin that are diseased and scarred. ?Follow these instructions at home: ?Medicines ? ?Take over-the-counter and prescription medicines only as told by your health care provider. ?If you were prescribed an antibiotic medicine, take it as told by your health care provider. Do not stop taking the antibiotic even if your condition improves. ?Skin care ?If you have open wounds, cover them with a clean dressing as told by your health care provider. Keep wounds clean by washing them gently with soap and water when you bathe. ?Do not shave the areas where you get hidradenitis suppurativa. ?Do not wear deodorant. ?Wear loose-fitting clothes. ?Try to avoid getting overheated or sweaty. If you get sweaty or wet, change into  clean, dry clothes as soon as you can. ?To help relieve pain and itchiness, cover sore areas with a warm, clean washcloth (warm compress) for 5-10 minutes as often as needed. ?If told by your health care provider, take a bleach bath twice a week: ?Fill your bathtub halfway with water. ?Pour in ? cup of unscented household bleach. ?Soak in the tub for 5-10 minutes. ?Only soak from the neck down. Avoid water on your face and hair. ?Shower to rinse off the bleach from your skin. ?General instructions ?Learn as much as you can about your disease so that you have an active role in your treatment. Work closely with your health care provider to find treatments that work for you. ?If you are overweight, work with your health care provider to lose weight as recommended. ?Do not use any products that contain nicotine or tobacco, such as cigarettes and e-cigarettes. If you need help quitting, ask your health care provider. ?If you struggle with living with this condition, talk with your health care provider or work with a mental health care provider as recommended. ?Keep all follow-up visits as told by your health care provider. This is important. ?Where to find more information ?Hidradenitis [place].: https://www.hs-foundation.org/ ?American Academy of Dermatology: http://www.nguyen-hutchinson.com/ ?Contact a health care provider if you have: ?A flare-up of hidradenitis suppurativa. ?A fever or chills. ?Trouble controlling your symptoms at home. ?Trouble doing your daily activities because of your symptoms. ?Trouble dealing with emotional problems related to your  condition. ?Summary ?Hidradenitis suppurativa is a long-term (chronic) skin disease. It is similar to a severe form of acne, but it affects areas of the body where acne would be unusual. ?The first symptoms are usually painful bumps in the skin, similar to pimples. The condition may only cause mild symptoms, or it may get worse over time (progress). ?If you  have open wounds, cover them with a clean dressing as told by your health care provider. Keep wounds clean by washing them gently with soap and water when you bathe. ?Besides skin care, treatment may include medic

## 2022-04-02 NOTE — Progress Notes (Signed)
[facility] History and Physical ? ?Reason for Referral: Left axilla lipoma/  ?Referring Physician: ED ? ?Chief Complaint   ?New Patient (Initial Visit) ?  ? ? ?Timothy Simpson is a 76 y.o. male.  ?HPI: Timothy Simpson is a 76 yo who has never had issues with his axilla but he started ot have swelling and drainage in the area a few weeks ago. He went to his PCP who prescribed antibiotics but he was unable to fill this due to a communication issue. He then went to the ED who prescribed him antibiotics which he took. The ED referred him for concern for a lipoma of his axilla. He has had no imaging. ? ?He denies any other history of boils or issues.  ? ?Past Medical History:  ?Diagnosis Date  ? Coronary artery disease   ? Gout   ? High cholesterol   ? Hypertension   ? NSTEMI (non-ST elevated myocardial infarction) ([place]) 07/29/2018  ? 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 LIMA to LAD, sequential SVG to medial and lateral sub-branches of ramus intermediate coronary artery, EVH via right thigh  ? Tobacco abuse   ? ? ?Past Surgical History:  ?Procedure Laterality Date  ? BACK SURGERY    ? COLONOSCOPY  2004  ? Dr. Gala Romney: Normal.  For history of tubulovillous adenoma back in 2001, five-year follow-up recommended.  ? COLONOSCOPY  2009  ? Dr. Emerson Monte: 6 mm polyp in the ascending segment and one in the sigmoid colon removed.  Pathology showed tubular adenomas.  5-year surveillance colonoscopy recommended.  ? COLONOSCOPY N/A 07/07/2019  ? Procedure: COLONOSCOPY;  Surgeon: Daneil Dolin, MD;  [facility];  Service: Endoscopy;  Laterality: N/A;  1:00pm  ? CORONARY ARTERY BYPASS GRAFT N/A 08/05/2018  ? Procedure: CORONARY ARTERY BYPASS GRAFTING (CABG) x3. Endoscopic Saphenous vein harvest.;  Surgeon: Rexene Alberts, MD;  Location: [place];  Service: Open Heart Surgery;  Laterality: N/A;  ? ENDARTERECTOMY Right 10/18/2019  ? Procedure: ENDARTERECTOMY CAROTID;  Surgeon: Marty Heck, MD;  Location: [place];   Service: Vascular;  Laterality: Right;  ? INTRAVASCULAR PRESSURE WIRE/FFR STUDY N/A 08/04/2018  ? Procedure: INTRAVASCULAR PRESSURE WIRE/FFR STUDY;  Surgeon: Burnell Blanks, MD;  Location: [place] [facility];  Service: Cardiovascular;  Laterality: N/A;  ? LEFT HEART CATH AND CORONARY ANGIOGRAPHY N/A 07/30/2018  ? Procedure: LEFT HEART CATH AND CORONARY ANGIOGRAPHY;  Surgeon: Jettie Booze, MD;  Location: [place] [facility];  Service: Cardiovascular;  Laterality: N/A;  ? PATCH ANGIOPLASTY Right 10/18/2019  ? Procedure: Patch Angioplasty;  Surgeon: Marty Heck, MD;  Location: [place];  Service: Vascular;  Laterality: Right;  ? POLYPECTOMY  07/07/2019  ? Procedure: POLYPECTOMY;  Surgeon: Daneil Dolin, MD;  [facility];  Service: Endoscopy;;  ? TEE WITHOUT CARDIOVERSION N/A 08/05/2018  ? Procedure: TRANSESOPHAGEAL ECHOCARDIOGRAM (TEE);  Surgeon: Rexene Alberts, MD;  Location: [place];  Service: Open Heart Surgery;  Laterality: N/A;  ? ? ?Family History  ?Problem Relation Age of Onset  ? Hypertension Father   ? ? ?Social History  ? ?Tobacco Use  ? Smoking status: Former  ?  Packs/day: 0.25  ?  Years: 40.00  ?  Pack years: 10.00  ?  Types: Cigarettes  ?  Quit date: 11/01/2019  ?  Years since quitting: 2.4  ? Smokeless tobacco: Never  ? Tobacco comments:  ?  he sated that he would quit if he was told  he had to.   ?Vaping Use  ? Vaping Use: Never used  ?Substance Use Topics  ? Alcohol use: No  ? Drug use: No  ? ? ?Medications: I have reviewed the patient's current medications. ?Allergies as of 04/02/2022   ?No Known Allergies ?  ? ?  ?Medication List  ?  ? ?  ? Accurate as of Apr 02, 2022 12:25 PM. If you have any questions, ask your nurse or doctor.  ?  ?  ? ?  ? ?allopurinol 100 MG tablet ?Commonly known as: ZYLOPRIM ?Take 100 mg by mouth daily. ?  ?amLODipine 5 MG tablet ?Commonly known as: Norvasc ?Take 1 tablet (5 mg total) by mouth daily. ?  ?aspirin 81 MG EC tablet ?Take 1 tablet (81 mg  total) by mouth daily. ?  ?atorvastatin 80 MG tablet ?Commonly known as: LIPITOR ?TAKE 1 TABLET EVERY DAY AT 6PM ?  ?cetirizine 10 MG tablet ?Commonly known as: ZYRTEC ?Take 10 mg by mouth daily as needed for allergies. ?  ?donepezil 10 MG tablet ?Commonly known as: ARICEPT ?Take 10 mg by mouth daily. ?  ?ferrous sulfate 325 (65 FE) MG EC tablet ?Take 1 tablet (325 mg total) by mouth 2 (two) times daily. ?  ?HYDROcodone-acetaminophen 5-325 MG tablet ?Commonly known as: Norco ?Take 1 tablet by mouth every 4 (four) hours as needed (coughing). ?  ?hydroxypropyl methylcellulose / hypromellose 2.5 % ophthalmic solution ?Commonly known as: ISOPTO TEARS / GONIOVISC ?Place 1 drop into both eyes 2 (two) times daily as needed for dry eyes. ?  ?memantine 5 MG tablet ?Commonly known as: NAMENDA ?Take 5 mg by mouth 2 (two) times daily. ?  ?metoprolol tartrate 25 MG tablet ?Commonly known as: LOPRESSOR ?TAKE 1 TABLET TWICE DAILY ?  ?ondansetron 4 MG tablet ?Commonly known as: ZOFRAN ?Take 1 tablet (4 mg total) by mouth every 6 (six) hours. ?  ?oxymetazoline 0.05 % nasal spray ?Commonly known as: AFRIN ?Place 1 spray into both nostrils 2 (two) times daily as needed for congestion. ?  ? ?  ? ? ? ?ROS:  ?A comprehensive review of systems was negative except for: Musculoskeletal: positive for swelling and drainage under left axilla ? ?Blood pressure 114/77, pulse 68, temperature 97.7 ?F (36.5 ?C), temperature source Oral, resp. rate 14, height 6' 1.5" (1.867 m), weight 199 lb (90.3 kg), SpO2 93 %. ?Physical Exam ?Vitals reviewed.  ?Constitutional:   ?   Appearance: Normal appearance.  ?HENT:  ?   Head: Normocephalic.  ?   Nose: Nose normal.  ?Cardiovascular:  ?   Rate and Rhythm: Normal rate and regular rhythm.  ?Pulmonary:  ?   Effort: Pulmonary effort is normal.  ?   Breath sounds: Normal breath sounds.  ?Abdominal:  ?   General: There is no distension.  ?   Palpations: Abdomen is soft.  ?   Tenderness: There is no abdominal  tenderness.  ?Musculoskeletal:     ?   General: Normal range of motion.  ?   Cervical back: Normal range of motion.  ?   Comments: Right axilla without issues, left axilla with some tracks and pitting, consistent with hidradenitis, area of drainage and granulation, some induration, no active drainage, mild tenderness, no lipoma   ?Skin: ?   General: Skin is warm.  ?Neurological:  ?   General: No focal deficit present.  ?   Mental Status: He is alert.  ?Psychiatric:     ?   Mood and Affect: Mood  normal.  ? ? ?Results: ?None ? ?Assessment & Plan:  ?Timothy Simpson is a 76 y.o. male with what I think is more like hidradenitis of the left axilla based on the tracks and pitting. He has not had issues prior and his other axilla is clean. This is not like a lipoma clinically.  ?Will get [place] to make sure not missing anything  ?If the [place] is not concerning for anything else, will send in a clindamycin ointment you can use as needed. ?Neosporin and bandage to the area now.  ? ?All questions were answered to the satisfaction of the patient and family. ? ?We discussed the pathophysiology of hidradenitis and the likelihood that it involves blockages in hair follicles and sweat glands. We discussed that we do not know the exact cause but that this is what is suspected. We discussed that this is a spectrum of disease from small localized areas to extensive disease in the armpits, groin, between the breast and in other areas where one sweats.  One develops painful, blocked areas in these regions that can become infected and form sinus tracks or tunnels. These areas causing scarring and inflammation, and can leak pus and have an odor.  We discussed the risk factors such as obesity, smoking, metabolic syndrome/ diabetes.  There is also some link between genetics and immune system issues.   ? ?Given the spectrum of disease, some hidradenitis can improve with medical therapies and some need excision.  Excision of disease is best  left for disease that is very well localized or that is so extensive and persistent that has failed to improve with other treatments.  Surgery for hidradenitis can be very disfiguring and painful. Large open wou

## 2022-04-11 ENCOUNTER — Ambulatory Visit (HOSPITAL_COMMUNITY)
Admission: RE | Admit: 2022-04-11 | Discharge: 2022-04-11 | Disposition: A | Discharge status: Home / Self Care | Payer: Medicare HMO | Source: Ambulatory Visit | Attending: General Surgery | Admitting: General Surgery

## 2022-04-11 DIAGNOSIS — L732 Hidradenitis suppurativa: Secondary | ICD-10-CM | POA: Diagnosis present

## 2022-04-17 ENCOUNTER — Other Ambulatory Visit: Payer: Self-pay | Admitting: *Deleted

## 2022-04-17 DIAGNOSIS — I6523 Occlusion and stenosis of bilateral carotid arteries: Secondary | ICD-10-CM

## 2022-04-30 ENCOUNTER — Ambulatory Visit (HOSPITAL_COMMUNITY)
Admission: RE | Admit: 2022-04-30 | Discharge: 2022-04-30 | Disposition: A | Discharge status: Home / Self Care | Payer: Medicare HMO | Source: Ambulatory Visit | Attending: Vascular Surgery | Admitting: Vascular Surgery

## 2022-04-30 ENCOUNTER — Ambulatory Visit: Payer: Medicare HMO | Admitting: Physician Assistant

## 2022-04-30 VITALS — BP 105/72 | HR 61 | Temp 97.4°F | Resp 20 | Ht 73.5 in | Wt 199.0 lb

## 2022-04-30 DIAGNOSIS — I6523 Occlusion and stenosis of bilateral carotid arteries: Secondary | ICD-10-CM

## 2022-04-30 NOTE — Progress Notes (Signed)
History of Present Illness:  Patient is a 76 y.o. year old male who presents for evaluation of carotid stenosis.  He has a history of asymptomatic high grade ICA stenosis treated with right CEA by Dr. Carlis Abbott on 10/18/2019.   The patient denies symptoms of TIA, amaurosis, or stroke.  He is here for f/u surveillance  carotid duplex.  On his last f/u 1 year ago he had no evidence of re stenosis on the right and < 39% stenosis on the left ICA.  He stays active and denies weakness.  He is medically managed on ASA and Lipitor daily.     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 Gala Romney: Normal.  For history of tubulovillous adenoma back in 2001, five-year follow-up recommended.   COLONOSCOPY  2009   Dr. Emerson Monte: 6 mm polyp in the ascending segment and one in the sigmoid colon removed.  Pathology showed tubular adenomas.  5-year surveillance colonoscopy recommended.   COLONOSCOPY N/A 07/07/2019   Procedure: COLONOSCOPY;  Surgeon: Daneil Dolin, MD;  [facility];  Service: Endoscopy;  Laterality: N/A;  1:00pm   CORONARY ARTERY BYPASS GRAFT N/A 08/05/2018   Procedure: CORONARY ARTERY BYPASS GRAFTING (CABG) x3. Endoscopic Saphenous vein harvest.;  Surgeon: Rexene Alberts, MD;  Location: [facility];  Service: Open Heart Surgery;  Laterality: N/A;   ENDARTERECTOMY Right 10/18/2019   Procedure: ENDARTERECTOMY CAROTID;  Surgeon: Marty Heck, MD;  Location: [facility];  Service: Vascular;  Laterality: Right;   INTRAVASCULAR PRESSURE WIRE/FFR STUDY N/A 08/04/2018   Procedure: INTRAVASCULAR PRESSURE  WIRE/FFR STUDY;  Surgeon: Burnell Blanks, MD;  Location: [facility];  Service: Cardiovascular;  Laterality: N/A;   LEFT HEART CATH AND CORONARY ANGIOGRAPHY N/A 07/30/2018   Procedure: LEFT HEART CATH AND CORONARY ANGIOGRAPHY;  Surgeon: Jettie Booze, MD;  Location: [facility];  Service: Cardiovascular;  Laterality: N/A;   PATCH ANGIOPLASTY Right 10/18/2019   Procedure: Patch Angioplasty;  Surgeon: Marty Heck, MD;  Location: [facility];  Service: Vascular;  Laterality: Right;   POLYPECTOMY  07/07/2019   Procedure: POLYPECTOMY;  Surgeon: Daneil Dolin, MD;  [facility];  Service: Endoscopy;;   TEE WITHOUT CARDIOVERSION N/A 08/05/2018   Procedure: TRANSESOPHAGEAL ECHOCARDIOGRAM (TEE);  Surgeon: Rexene Alberts, MD;  Location: [facility];  Service: Open Heart Surgery;  Laterality: N/A;     Social History Social History   Tobacco Use   Smoking status: Former    Packs/day: 0.25    Years: 40.00    Pack years: 10.00    Types: Cigarettes    Quit date: 11/01/2019    Years since quitting: 2.4   Smokeless tobacco: Never   Tobacco comments:    he sated that he would quit if he was told he had to.   Vaping Use   Vaping Use: Never used  Substance Use Topics   Alcohol use: No   Drug use: No    Family History Family  History  Problem Relation Age of Onset   Hypertension Father     Allergies  No Known Allergies   Current Outpatient Medications  Medication Sig Dispense Refill   allopurinol (ZYLOPRIM) 100 MG tablet Take 100 mg by mouth daily.     amLODipine (NORVASC) 5 MG tablet Take 1 tablet (5 mg total) by mouth daily. 90 tablet 3   aspirin EC 81 MG EC tablet Take 1 tablet (81 mg total) by mouth daily.     atorvastatin (LIPITOR) 80 MG tablet TAKE 1 TABLET EVERY DAY AT 6PM 90 tablet 3   cetirizine (ZYRTEC) 10 MG tablet Take 10 mg by mouth daily as needed for allergies.     donepezil (ARICEPT) 10 MG tablet Take 10 mg by mouth daily.      hydrochlorothiazide (HYDRODIURIL) 25 MG tablet Take 25 mg by mouth daily.     hydroxypropyl methylcellulose / hypromellose (ISOPTO TEARS / GONIOVISC) 2.5 % ophthalmic solution Place 1 drop into both eyes 2 (two) times daily as needed for dry eyes.      memantine (NAMENDA) 5 MG tablet Take 5 mg by mouth 2 (two) times daily.     metoprolol tartrate (LOPRESSOR) 25 MG tablet TAKE 1 TABLET TWICE DAILY 180 tablet 3   ondansetron (ZOFRAN) 4 MG tablet Take 1 tablet (4 mg total) by mouth every 6 (six) hours. 12 tablet 0   oxymetazoline (AFRIN) 0.05 % nasal spray Place 1 spray into both nostrils 2 (two) times daily as needed for congestion.     Cholecalciferol (VITAMIN D) 125 MCG (5000 UT) CAPS Take 1 capsule by mouth daily.     ferrous sulfate 325 (65 FE) MG EC tablet Take 1 tablet (325 mg total) by mouth 2 (two) times daily. 60 tablet 3   HYDROcodone-acetaminophen (NORCO) 5-325 MG tablet Take 1 tablet by mouth every 4 (four) hours as needed (coughing). (Patient not taking: Reported on 11/08/2021) 6 tablet 0   metFORMIN (GLUCOPHAGE) 500 MG tablet Take 500 mg by mouth daily.     No current facility-administered medications for this visit.    ROS:   General:  No weight loss, Fever, chills  HEENT: No recent headaches, no nasal bleeding, no visual changes, no sore throat  Neurologic: No dizziness, blackouts, seizures. No recent symptoms of stroke or mini- stroke. No recent episodes of slurred speech, or temporary blindness.  Cardiac: No recent episodes of chest pain/pressure, no shortness of breath at rest.  No shortness of breath with exertion.  Denies history of atrial fibrillation or irregular heartbeat  Vascular: No history of rest pain in feet.  No history of claudication.  No history of non-healing ulcer, No history of DVT   Pulmonary: No home oxygen, no productive cough, no hemoptysis,  No asthma or wheezing  Musculoskeletal:  '[ ]'$  Arthritis, '[ ]'$  Low back pain,  '[ ]'$  Joint pain  Hematologic:No  history of hypercoagulable state.  No history of easy bleeding.  No history of anemia  Gastrointestinal: No hematochezia or melena,  No gastroesophageal reflux, no trouble swallowing  Urinary: '[ ]'$  chronic Kidney disease, '[ ]'$  on HD - '[ ]'$  MWF or '[ ]'$  TTHS, '[ ]'$  Burning with urination, '[ ]'$  Frequent urination, '[ ]'$  Difficulty urinating;   Skin: No rashes  Psychological: No history of anxiety,  No history of depression   Physical Examination  Vitals:   04/30/22 1034 04/30/22 1037  BP: 105/71 105/72  Pulse: 61   Resp: 20   Temp: (!) 97.4  F (36.3 C)   TempSrc: Temporal   SpO2: 98%   Weight: 199 lb (90.3 kg)   Height: 6' 1.5" (1.867 m)     Body mass index is 25.9 kg/m.  General:  Alert and oriented, no acute distress HEENT: Normal Neck: No bruit or JVD Pulmonary: Clear to auscultation bilaterally Cardiac: Regular Rate and Rhythm without murmur Gastrointestinal: Soft, non-tender, non-distended, no mass, no scars Skin: No rash Extremity Pulses:  2+ radial pulses bilaterally Musculoskeletal: No deformity or edema  Neurologic: Upper and lower extremity motor 5/5 and symmetric  DATA:     Right Carotid Findings:  +----------+--------+--------+--------+------------------+--------+            PSV cm/sEDV cm/sStenosisPlaque DescriptionComments  +----------+--------+--------+--------+------------------+--------+  CCA Prox  99      27              heterogenous                +----------+--------+--------+--------+------------------+--------+  CCA Mid   93      24              heterogenous                +----------+--------+--------+--------+------------------+--------+  CCA Distal101     24              heterogenous                +----------+--------+--------+--------+------------------+--------+  ICA Prox  89      11              heterogenous                +----------+--------+--------+--------+------------------+--------+  ICA Mid   68       25                                          +----------+--------+--------+--------+------------------+--------+  ICA Distal78      27                                          +----------+--------+--------+--------+------------------+--------+  ECA       57      12                                          +----------+--------+--------+--------+------------------+--------+   +----------+--------+-------+----------------+-------------------+            PSV cm/sEDV cmsDescribe        Arm Pressure (mmHG)  +----------+--------+-------+----------------+-------------------+  [id number]            Multiphasic, [id number]                  +----------+--------+-------+----------------+-------------------+   +---------+--------+--+--------+--+---------+  VertebralPSV cm/s46EDV cm/s18Antegrade  +---------+--------+--+--------+--+---------+       Left Carotid Findings:  +----------+--------+--------+--------+------------------+--------+            PSV cm/sEDV cm/sStenosisPlaque DescriptionComments  +----------+--------+--------+--------+------------------+--------+  CCA Prox  104     22              heterogenous                +----------+--------+--------+--------+------------------+--------+  CCA Mid   79      22  heterogenous                +----------+--------+--------+--------+------------------+--------+  CCA Distal79      18              heterogenous                +----------+--------+--------+--------+------------------+--------+  ICA Prox  54      19              heterogenous                +----------+--------+--------+--------+------------------+--------+  ICA Mid   63      24                                          +----------+--------+--------+--------+------------------+--------+  ICA Distal78      31                                           +----------+--------+--------+--------+------------------+--------+  ECA       122     15                                          +----------+--------+--------+--------+------------------+--------+   +----------+--------+--------+----------------+-------------------+            PSV cm/sEDV cm/sDescribe        Arm Pressure (mmHG)  +----------+--------+--------+----------------+-------------------+  [id number]             Multiphasic, [id number]                  +----------+--------+--------+----------------+-------------------+   +---------+--------+--+--------+---------------+  VertebralPSV cm/s24EDV cm/sBi- directional  +---------+--------+--+--------+---------------+    Summary:  Right Carotid: Velocities in the right ICA are consistent with a 1-39%  stenosis.   Left Carotid: Velocities in the left ICA are consistent with a 1-39%  stenosis.   Vertebrals:  Right vertebral artery demonstrates antegrade flow. Left  vertebral               artery demonstrates bidirectional flow.  Subclavians: Normal flow hemodynamics were seen in bilateral subclavian               arteries.   ASSESSMENT/PLAN: Asymptomatic Carotid stenosis with previous right CEA 10/18/2019 He remains asymptomatic for stroke/TIA.  His duplex demonstrates no re stenosis of the right ICA and < 39% stenosis of the left ICA.  He will continue maximum medical management and f/u in 18 months for repeat carotid duplex.  If he have symptoms of stroke he will call 911.       Roxy Horseman PA-C [facility] [phone]  MD in clinic [facility]

## 2022-05-03 IMAGING — CT CT ABD-PELV W/ CM
2 of 5 series · 15 of 46 positions shown, 17 images · IV contrast (Omnipaque or Isovue)
Comparison: 08/01/2018

CLINICAL DATA: Nonlocalized acute abdominal pain

EXAM:
CT ABDOMEN AND PELVIS WITH CONTRAST
TECHNIQUE: Multidetector CT imaging of the abdomen and pelvis was performed
using the standard protocol following bolus administration of
intravenous contrast.
CONTRAST:  100mL OMNIPAQUE IOHEXOL 300 MG/ML  SOLN

[Series 2: axial st · axial · 0.82mm/px · z∈[+1052,+1512]mm · 12 of 104 slices shown, 14 images]
[im 6/104  soft-tissue]
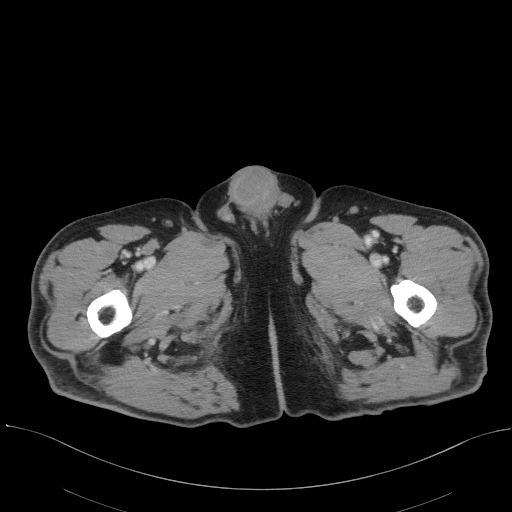
[im 6/104  bone]
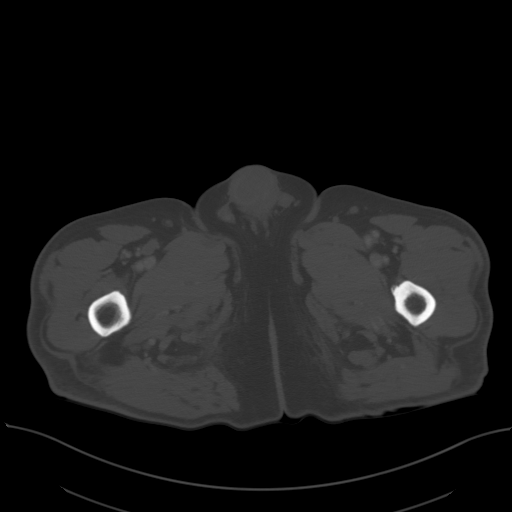
[im 17/104  soft-tissue]
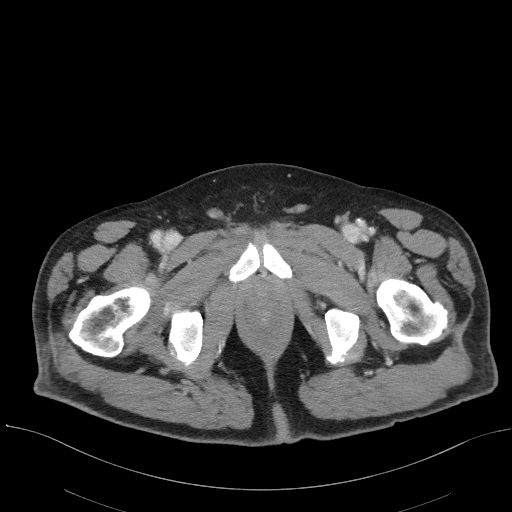
[im 22/104  soft-tissue]
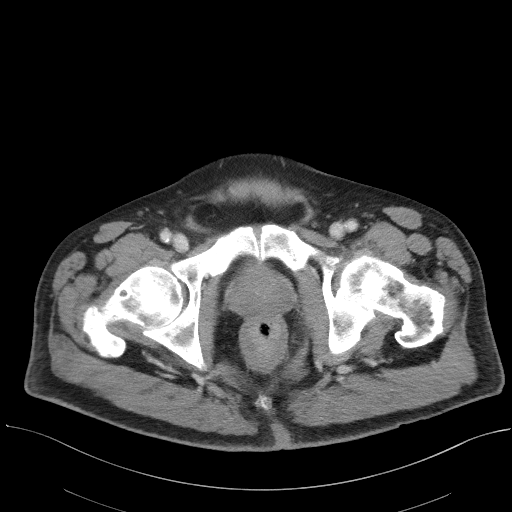
[im 33/104  soft-tissue]
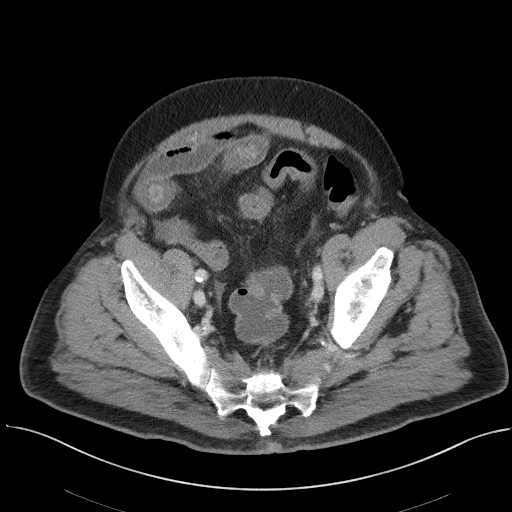
[im 38/104  soft-tissue]
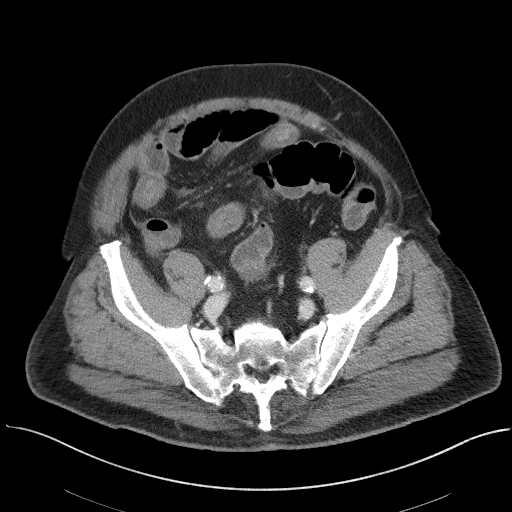
[im 49/104  soft-tissue]
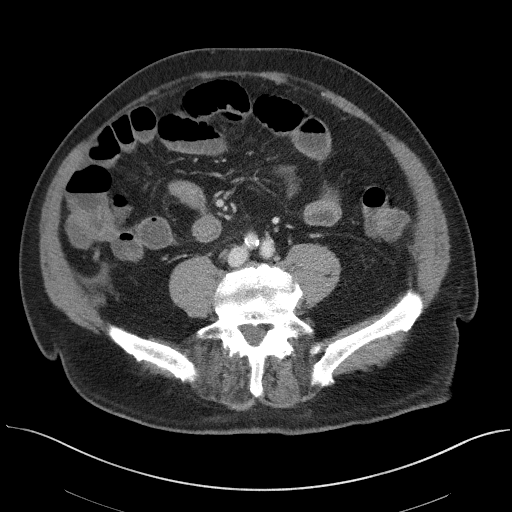
[im 55/104  soft-tissue]
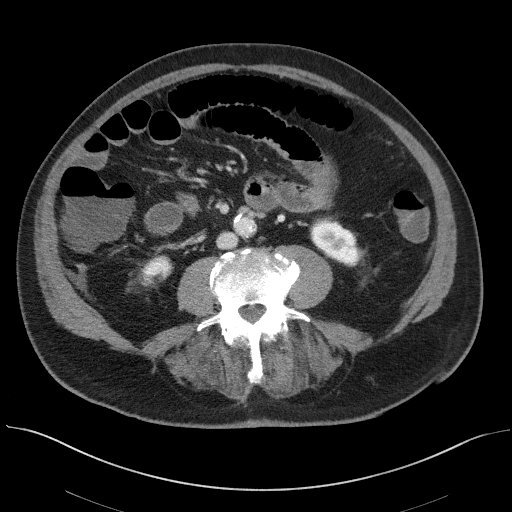
[im 66/104  soft-tissue]
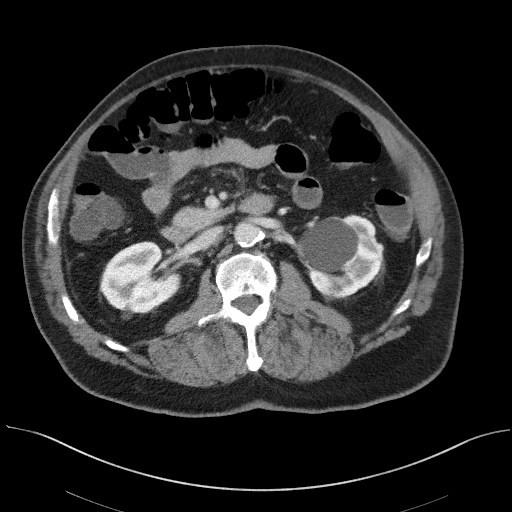
[im 71/104  soft-tissue]
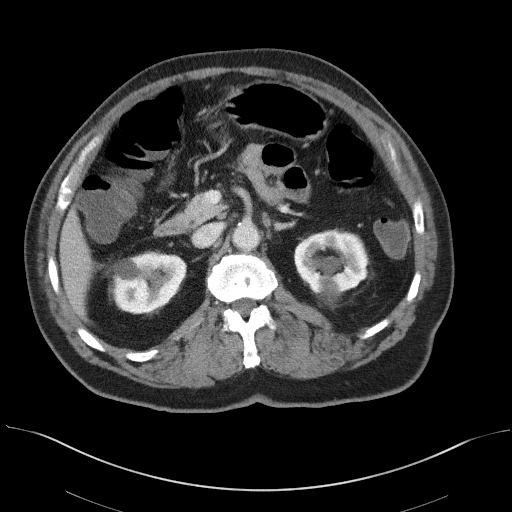
[im 71/104  bone]
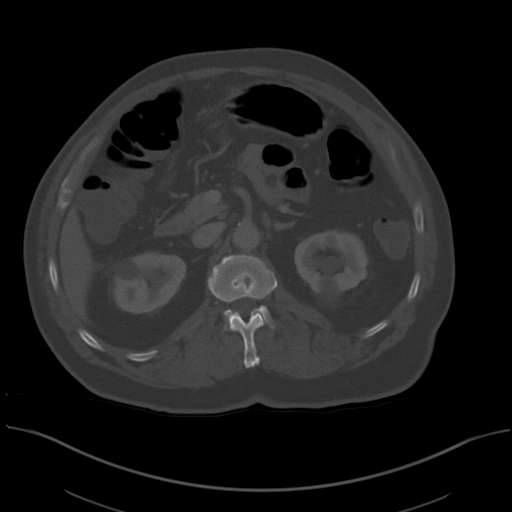
[im 82/104  soft-tissue]
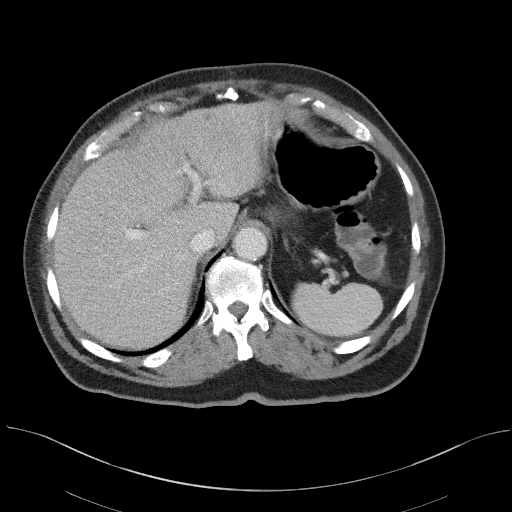
[im 87/104  soft-tissue]
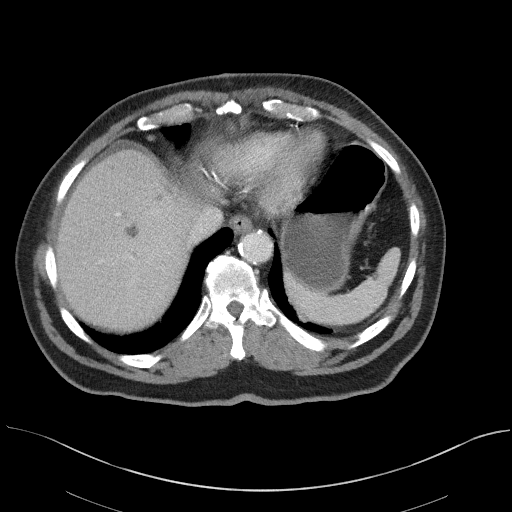
[im 98/104  soft-tissue]
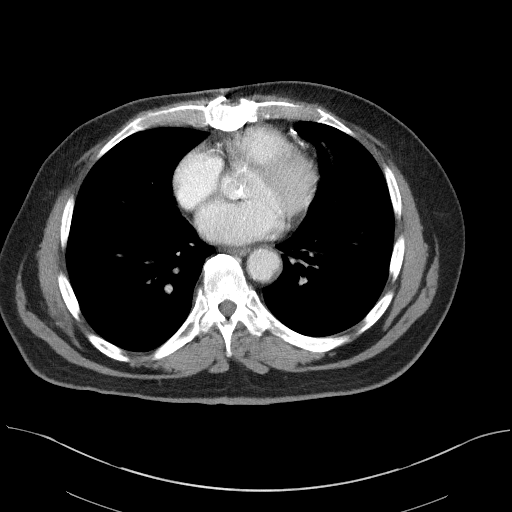

[Series 5: coronal st · coronal · 0.78mm/px · 3 of 114 slices shown]
[im 38/114  soft-tissue]
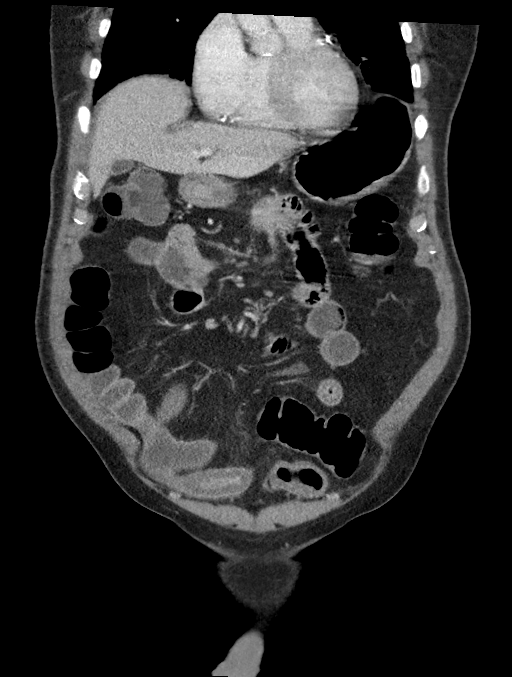
[im 51/114  soft-tissue]
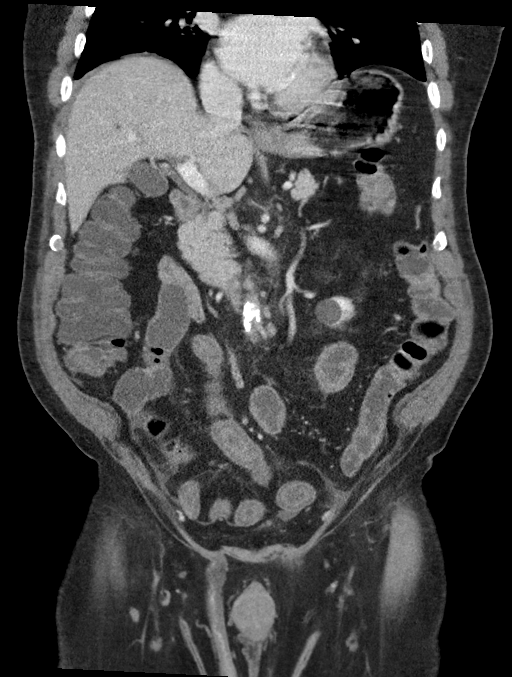
[im 63/114  soft-tissue]
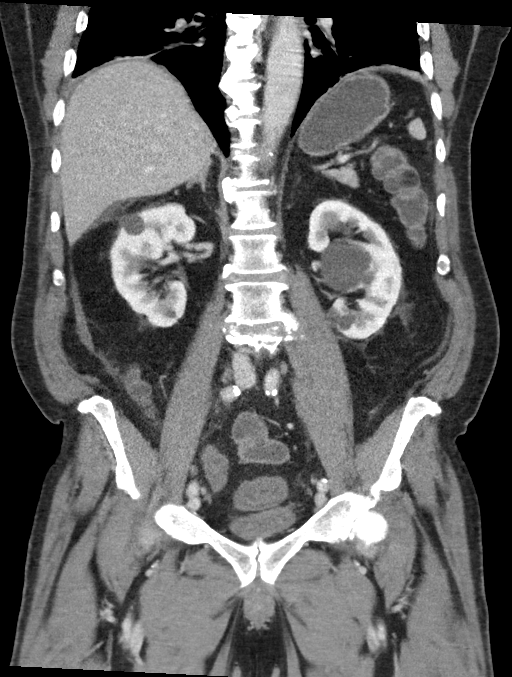

[15 of 46 positions shown; findings below may reference images not displayed]

FINDINGS: Lower chest: The visualized lung bases are clear bilaterally.
Coronary artery bypass grafting has been performed. Extensive
coronary artery calcification is seen within the native coronary
arteries. Global cardiac size within normal limits.

Hepatobiliary: Multiple cysts are noted within the a right hepatic
dome. The liver is otherwise unremarkable. No intra or extrahepatic
biliary ductal dilation. Gallbladder unremarkable.

Pancreas: Unremarkable

Spleen: Unremarkable

Adrenals/Urinary Tract: The adrenal glands are unremarkable.
Multiple simple cortical cysts are seen within the kidneys
bilaterally. The kidneys are otherwise unremarkable. The bladder is
decompressed.

Stomach/Bowel: There is marked bowel wall thickening and mesenteric
inflammatory stranding involving the terminal ileum with sparing of
the terminal roughly 15 cm in keeping with a infectious or
inflammatory terminal ileitis. There is resultant mild partial small
bowel obstruction with mild gaseous distension of the more proximal
jejunum. The colon and rectum appear fluid-filled, a finding that
may plan is and clinically as diarrhea. No evidence of bowel
ischemia. No pneumatosis or free intraperitoneal gas. There is trace
ascites present. No loculated intra-abdominal fluid collections.
Appendix normal.

Vascular/Lymphatic: No pathologic adenopathy. Moderate aortoiliac
atherosclerotic calcification with particularly prominent
calcification seen at the origin of the renal arteries bilaterally.
No aneurysm.

Reproductive: Mild to moderate prostate enlargement.

Other: Mild circumferential thickening of the terminal rectum is
nonspecific.

Musculoskeletal: Advanced degenerative changes noted within the
lumbar spine. No lytic or blastic bone lesions.
IMPRESSION: Marked infectious or inflammatory change of the ileum with sparing
of the terminal ileum at the ileocecal junction. Resultant partial
small bowel obstruction. No evidence of a bowel ischemia or
perforation.

## 2022-05-27 ENCOUNTER — Telehealth: Payer: Self-pay | Admitting: *Deleted

## 2022-05-27 MED ORDER — CLINDAMYCIN PHOSPHATE 1 % EX GEL: Freq: Two times a day (BID) | CUTANEOUS | 0 refills | Status: DC | PRN

## 2022-05-27 MED ORDER — DOXYCYCLINE HYCLATE 100 MG PO TABS: 100.0000 mg | ORAL_TABLET | Freq: Two times a day (BID) | ORAL | 0 refills | Status: AC

## 2022-06-06 ENCOUNTER — Ambulatory Visit: Payer: Medicare HMO | Admitting: General Surgery

## 2022-06-06 ENCOUNTER — Encounter: Payer: Self-pay | Admitting: General Surgery

## 2022-06-06 VITALS — BP 103/68 | HR 69 | Temp 98.2°F | Resp 18 | Ht 73.58 in | Wt 197.0 lb

## 2022-06-06 DIAGNOSIS — L732 Hidradenitis suppurativa: Secondary | ICD-10-CM

## 2022-06-06 NOTE — Progress Notes (Signed)
[facility] Note   HPI:  76 y.o. Male presents to clinic for follow-up evaluation of his left axilla hidradenitis. Confirmed on US done. He had another flare and I prescribed antibiotics orally and clindamycin. Patient reports he has no further issues and the pain and swelling is gone.   Review of Systems:  No tenderness or pain All other review of systems: otherwise negative   Vital Signs:  BP 103/68   Pulse 69   Temp 98.2 F (36.8 C) (Oral)   Resp 18   Ht 6' 1.58" (1.869 m)   Wt 197 lb (89.4 kg)   SpO2 94%   BMI 25.58 kg/m    Physical Exam:  Physical Exam Vitals reviewed.  HENT:     Head: Normocephalic.  Cardiovascular:     Rate and Rhythm: Normal rate.  Pulmonary:     Effort: Pulmonary effort is normal.  Musculoskeletal:     Comments: Left axilla with scarring and pitting from prior infections, no drainage, no erythema  Skin:    General: Skin is warm.     Imaging:  CLINICAL DATA:  Hidradenitis versus cyst.   EXAM: ULTRASOUND OF THE LEFT AXILLA   COMPARISON:  None available.   FINDINGS: Targeted ultrasound is performed, showing a focal area of skin thickening in the LEFT axilla, in the area of concern. Immediately beneath the area of focal skin thickening, there is a small sinus tract into the subcutaneous tissues. However, there is no mass or fluid collection in this region. Focal skin thickening measures 1.4 x 0.4 x 1.0 centimeters. No visible lymph nodes in this region.   IMPRESSION: Focal skin thickening in the LEFT axilla corresponding to the area of concern. There is no associated abscess or cyst.Findings favor hidradenitis.   RECOMMENDATION: Clinical follow-up as needed.   I have discussed the findings and recommendations with the patient. If applicable, a reminder letter will be sent to the patient regarding the next appointment.   BI-RADS CATEGORY  2: Benign.     Electronically Signed   By: Nolon Nations M.D.   On:  05/27/2022 10:42  Assessment:  76 y.o. yo Male with hidradenitis. He is doing better now. He wants to avoid any surgery right now.   Plan:  Use Clindamycin BID as needed  We discussed the pathophysiology of hidradenitis and the likelihood that it involves blockages in hair follicles and sweat glands. We discussed that we do not know the exact cause but that this is what is suspected. We discussed that this is a spectrum of disease from small localized areas to extensive disease in the armpits, groin, between the breast and in other areas where one sweats.  One develops painful, blocked areas in these regions that can become infected and form sinus tracks or tunnels. These areas causing scarring and inflammation, and can leak pus and have an odor.  We discussed the risk factors such as obesity, smoking, metabolic syndrome/ diabetes.  There is also some link between genetics and immune system issues.    Given the spectrum of disease, some hidradenitis can improve with medical therapies and some need excision.  Excision of disease is best left for disease that is very well localized or that is so extensive and persistent that has failed to improve with other treatments.  Surgery for hidradenitis can be very disfiguring and painful. Large open wounds may be left in sensitive areas like the armpit and groin, and the hidradenitis can recur even after excision. Multiple  excisions can be necessary as well as long-term wound care with months of packing and even skin grafts.   More recent studies are showing that medical treatments and lifestyle modifications may be more effective at treating this disease and cause less morbidity (pain and suffering) than a large surgical debridement.  We discussed exhausting these medical therapies including potentially biologics or immune modulators and discussion with a dermatologist prior to pursuing any surgical intervention.    All of the above recommendations were  discussed with the patient and patient's family, and all of patient's and family's questions were answered to their expressed satisfaction.  Curlene Labrum, MD [facility] [address] [place], [place] [id number] [phone] (office)

## 2022-06-06 NOTE — Patient Instructions (Signed)
We discussed the pathophysiology of hidradenitis and the likelihood that it involves blockages in hair follicles and sweat glands. We discussed that we do not know the exact cause but that this is what is suspected. We discussed that this is a spectrum of disease from small localized areas to extensive disease in the armpits, groin, between the breast and in other areas where one sweats.  One develops painful, blocked areas in these regions that can become infected and form sinus tracks or tunnels. These areas causing scarring and inflammation, and can leak pus and have an odor.  We discussed the risk factors such as obesity, smoking, metabolic syndrome/ diabetes.  There is also some link between genetics and immune system issues.    Given the spectrum of disease, some hidradenitis can improve with medical therapies and some need excision.  Excision of disease is best left for disease that is very well localized or that is so extensive and persistent that has failed to improve with other treatments.  Surgery for hidradenitis can be very disfiguring and painful. Large open wounds may be left in sensitive areas like the armpit and groin, and the hidradenitis can recur even after excision. Multiple excisions can be necessary as well as long-term wound care with months of packing and even skin grafts.   More recent studies are showing that medical treatments and lifestyle modifications may be more effective at treating this disease and cause less morbidity (pain and suffering) than a large surgical debridement.  We discussed exhausting these medical therapies including potentially biologics or immune modulators and discussion with a dermatologist prior to pursuing any surgical intervention.

## 2022-06-13 ENCOUNTER — Encounter: Payer: Self-pay | Admitting: *Deleted

## 2022-06-26 ENCOUNTER — Telehealth: Payer: Self-pay | Admitting: Cardiology

## 2022-06-26 NOTE — Telephone Encounter (Signed)
Spoke to [place] with [facility] and verbalized that our provider would refill medication for pt when he needed it. Tatiana verbalized understanding.

## 2022-06-26 NOTE — Telephone Encounter (Signed)
Pt c/o medication issue:  1. Name of Medication: metoprolol tartrate (LOPRESSOR) 25 MG tablet atorvastatin (LIPITOR) 80 MG tablet  2. How are you currently taking this medication (dosage and times per day)? As written  3. Are you having a reaction (difficulty breathing--STAT)? no  4. What is your medication issue? Dan Europe from [facility] clinic called asking if these medications will be refill by our office. She is requesting a call back.

## 2022-09-05 ENCOUNTER — Emergency Department (HOSPITAL_COMMUNITY)
Admission: EM | Admit: 2022-09-05 | Discharge: 2022-09-05 | Disposition: A | Discharge status: Home / Self Care | Payer: Medicare HMO | Attending: Emergency Medicine | Admitting: Emergency Medicine

## 2022-09-05 ENCOUNTER — Other Ambulatory Visit: Payer: Self-pay

## 2022-09-05 ENCOUNTER — Encounter (HOSPITAL_COMMUNITY): Payer: Self-pay | Admitting: *Deleted

## 2022-09-05 ENCOUNTER — Emergency Department (HOSPITAL_COMMUNITY): Payer: Medicare HMO

## 2022-09-05 DIAGNOSIS — I10 Essential (primary) hypertension: Secondary | ICD-10-CM | POA: Diagnosis not present

## 2022-09-05 DIAGNOSIS — U071 COVID-19: Secondary | ICD-10-CM | POA: Diagnosis not present

## 2022-09-05 DIAGNOSIS — R059 Cough, unspecified: Secondary | ICD-10-CM | POA: Diagnosis present

## 2022-09-05 DIAGNOSIS — Z79899 Other long term (current) drug therapy: Secondary | ICD-10-CM | POA: Insufficient documentation

## 2022-09-05 DIAGNOSIS — Z7982 Long term (current) use of aspirin: Secondary | ICD-10-CM | POA: Insufficient documentation

## 2022-09-05 LAB — SARS CORONAVIRUS 2 BY RT PCR: SARS Coronavirus 2 by RT PCR: POSITIVE — AB

## 2022-09-05 MED ORDER — BENZONATATE 100 MG PO CAPS: 100.0000 mg | ORAL_CAPSULE | Freq: Three times a day (TID) | ORAL | 0 refills | Status: DC

## 2022-09-05 MED ORDER — BENZONATATE 100 MG PO CAPS
100.0000 mg | ORAL_CAPSULE | Freq: Once | ORAL | Status: AC
  Administered 2022-09-05: 100 mg via ORAL
  Filled 2022-09-05: qty 1

## 2022-09-05 MED ORDER — MOLNUPIRAVIR EUA 200MG CAPSULE: 4.0000 | ORAL_CAPSULE | Freq: Two times a day (BID) | ORAL | 0 refills | Status: AC

## 2022-09-05 NOTE — Discharge Instructions (Addendum)
You were seen in the emergency department today for COVID-19.  You do not have pneumonia.  I have given you a prescription for the antiviral treatment called molnupiravir that you will take twice a day over the next 5 days.  I have also prescribed you some cough medication.  Please return to the emergency department if you have difficulty breathing or shortness of breath with fever as this may be a sign of pneumonia.  Please follow-up with your primary care provider in a week to ensure that your symptoms have been improving.  Please isolate over the next 5 days.  After that if your symptoms are improving you can go out in public with a mask on for 5 more days for total of 10 days of masked isolation.  If you need further recommendations regarding isolation or travel please follow CDC guidelines.

## 2022-09-05 NOTE — ED Provider Notes (Signed)
[facility] EMERGENCY DEPARTMENT Provider Note   CSN: [id number] Arrival date & time: 09/05/22  1925     History  Chief Complaint  Patient presents with   Cough    Timothy Simpson is a 76 y.o. male. With past medical history of HTN, HLD, gout, NSTEMI who presents to the emergency department with cough.  States symptoms began 2-3 days ago. Describes having persistent, non-productive cough. States he was exposed by significant other 1 week ago to COVID-19 and concerned he has this. Denies having fever, shortness of breath, sore throat, headache or chills. Has been eating and drinking appropriately.    Cough      Home Medications Prior to Admission medications   Medication Sig Start Date End Date Taking? Authorizing Provider  benzonatate (TESSALON) 100 MG capsule Take 1 capsule (100 mg total) by mouth every 8 (eight) hours. 09/05/22  Yes Mickie Hillier, PA-C  molnupiravir EUA (LAGEVRIO) 200 mg CAPS capsule Take 4 capsules (800 mg total) by mouth 2 (two) times daily for 5 days. 09/05/22 09/10/22 Yes Mickie Hillier, PA-C  allopurinol (ZYLOPRIM) 100 MG tablet Take 100 mg by mouth daily. 04/28/18   [provider]  amLODipine (NORVASC) 5 MG tablet Take 1 tablet (5 mg total) by mouth daily. 11/08/21   Arnoldo Lenis, MD  aspirin EC 81 MG EC tablet Take 1 tablet (81 mg total) by mouth daily. 08/09/18   Nani Skillern, PA-C  atorvastatin (LIPITOR) 80 MG tablet TAKE 1 TABLET EVERY DAY AT [facility] 09/03/21   Arnoldo Lenis, MD  cetirizine (ZYRTEC) 10 MG tablet Take 10 mg by mouth daily as needed for allergies.    [provider]  Cholecalciferol (VITAMIN D) 125 MCG (5000 UT) CAPS Take 1 capsule by mouth daily. 01/29/22   [provider]  clindamycin (CLINDAGEL) 1 % gel Apply topically 2 (two) times daily as needed (to Left axilla). Apply pea sized amount to left axilla 2x daily as needed for infection. 05/27/22   Virl Cagey, MD  ferrous sulfate 325  (65 FE) MG EC tablet Take 1 tablet (325 mg total) by mouth 2 (two) times daily. 08/11/18   Barrett, Erin R, PA-C  hydrochlorothiazide (HYDRODIURIL) 25 MG tablet Take 25 mg by mouth daily. 04/08/22   [provider]  HYDROcodone-acetaminophen (NORCO) 5-325 MG tablet Take 1 tablet by mouth every 4 (four) hours as needed (coughing). 3/97/67   Delora Fuel, MD  hydroxypropyl methylcellulose / hypromellose (ISOPTO TEARS / GONIOVISC) 2.5 % ophthalmic solution Place 1 drop into both eyes 2 (two) times daily as needed for dry eyes.     [provider]  memantine (NAMENDA) 10 MG tablet Take 10 mg by mouth 2 (two) times daily. 05/09/22   [provider]  metFORMIN (GLUCOPHAGE) 500 MG tablet Take 500 mg by mouth daily. 03/23/22   [provider]  metoprolol tartrate (LOPRESSOR) 25 MG tablet TAKE 1 TABLET TWICE DAILY 01/14/22   Arnoldo Lenis, MD      Allergies    Patient has no known allergies.    Review of Systems   Review of Systems  Respiratory:  Positive for cough.   All other systems reviewed and are negative.   Physical Exam Updated Vital Signs BP (!) 143/77 (BP Location: Left Arm)   Pulse 79   Temp 98.7 F (37.1 C) (Oral)   Resp 19   Ht '6\' 1"'$  (1.854 m)   Wt 127 kg   SpO2 96%  BMI 36.94 kg/m  Physical Exam Vitals and nursing note reviewed.  Constitutional:      General: He is not in acute distress.    Appearance: Normal appearance. He is normal weight. He is ill-appearing. He is not toxic-appearing.  HENT:     Head: Normocephalic and atraumatic.     Mouth/Throat:     Mouth: Mucous membranes are moist.     Pharynx: Posterior oropharyngeal erythema present.  Eyes:     General: No scleral icterus. Cardiovascular:     Rate and Rhythm: Normal rate and regular rhythm.     Pulses: Normal pulses.     Heart sounds: No murmur heard. Pulmonary:     Effort: Pulmonary effort is normal. No respiratory distress.     Breath sounds: Normal breath sounds. No  wheezing, rhonchi or rales.  Skin:    General: Skin is warm and dry.     Findings: No rash.  Neurological:     General: No focal deficit present.     Mental Status: He is alert and oriented to person, place, and time. Mental status is at baseline.  Psychiatric:        Mood and Affect: Mood normal.        Behavior: Behavior normal.        Thought Content: Thought content normal.        Judgment: Judgment normal.    ED Results / Procedures / Treatments   Labs (all labs ordered are listed, but only abnormal results are displayed) Labs Reviewed  SARS CORONAVIRUS 2 BY RT PCR - Abnormal; Notable for the following components:      Result Value   SARS Coronavirus 2 by RT PCR POSITIVE (*)    All other components within normal limits    EKG None  Radiology DG Chest 2 View  Result Date: 09/05/2022 CLINICAL DATA:  Cough EXAM: CHEST - 2 VIEW COMPARISON:  03/15/2020 FINDINGS: Frontal and lateral views of the chest demonstrate a stable cardiac silhouette. Postsurgical changes from CABG. Chronic left basilar scarring. No airspace disease, effusion, or pneumothorax. No acute bony abnormalities. IMPRESSION: 1. No acute intrathoracic process. Electronically Signed   By: Randa Ngo M.D.   On: 09/05/2022 21:25    Procedures Procedures   Medications Ordered in ED Medications  benzonatate (TESSALON) capsule 100 mg (has no administration in time range)    ED Course/ Medical Decision Making/ A&P                           Medical Decision Making Amount and/or Complexity of Data Reviewed Radiology: ordered.  Risk Prescription drug management.   This patient presents to the ED with chief complaint(s) of cough with pertinent past medical history of HTN, HLD, NSTEMI which further complicates the presenting complaint. The complaint involves an extensive differential diagnosis and also carries with it a high risk of complications and morbidity.    The differential diagnosis includes viral  URI, pneumonia, post nasal drip, medication side effect, CHF, COPD or asthma, etc.    Additional history obtained: Additional history obtained from spouse Records reviewed Care Everywhere/External Records and Primary Care Documents  ED Course and Reassessment: 76 year old male who presents to the emergency department with cough.  Overall well-appearing, non-septic, non-toxic in appearance. He has dry cough on exam with mild oropharyngeal erythema. No tonsillar exudates.  Lungs are clear bilaterally without respiratory distress or hypoxia.   COVID +, CXR clear   Given tessalon  here in department.  Feel this explains his symptoms. Doubt other etiologies like chronic lung disease, CHF.   Will discharge with molnupiravir given statin use. Will also provider Rx for tessalon. Given return precautions for worsening SOB, difficulty breathing, productive cough with fever. He verbalized understanding.   Independent labs interpretation:  The following labs were independently interpreted: COVID +   Independent visualization of imaging: - I independently visualized the following imaging with scope of interpretation limited to determining acute life threatening conditions related to emergency care: CXR, which revealed no acute findings  Consultation: - Consulted or discussed management/test interpretation w/ external professional: not indicated   Consideration for admission or further workup: not indicated  Social Determinants of health: low health literacy  Final Clinical Impression(s) / ED Diagnoses Final diagnoses:  COVID-19    Rx / DC Orders ED Discharge Orders          Ordered    benzonatate (TESSALON) 100 MG capsule  Every 8 hours        09/05/22 2253    molnupiravir EUA (LAGEVRIO) 200 mg CAPS capsule  2 times daily        09/05/22 2253              Mickie Hillier, PA-C 09/06/22 1037    Fredia Sorrow, MD 09/10/22 1058

## 2022-09-05 NOTE — ED Triage Notes (Signed)
Pt with cough since yesterday, positive covid exposure a week ago.

## 2022-09-05 NOTE — ED Notes (Signed)
Pt in bed, sig other at bedside, pt states that he is ready to go home, pt verbalized understanding d/c and follow up, advised to return for any concerns or worsening symptoms. Pt ambulatory from dpt, resps even and unlabored, pt talking in full sentences.

## 2022-10-12 ENCOUNTER — Other Ambulatory Visit: Payer: Self-pay | Admitting: Cardiology

## 2022-11-07 ENCOUNTER — Other Ambulatory Visit: Payer: Self-pay | Admitting: Cardiology

## 2022-12-16 ENCOUNTER — Other Ambulatory Visit: Payer: Self-pay | Admitting: Cardiology

## 2023-01-13 ENCOUNTER — Other Ambulatory Visit: Payer: Self-pay | Admitting: Cardiology

## 2023-01-13 ENCOUNTER — Other Ambulatory Visit (HOSPITAL_COMMUNITY): Payer: Self-pay | Admitting: Adult Health

## 2023-01-13 ENCOUNTER — Ambulatory Visit (HOSPITAL_COMMUNITY)
Admission: RE | Admit: 2023-01-13 | Discharge: 2023-01-13 | Disposition: A | Discharge status: Home / Self Care | Payer: Medicare HMO | Source: Ambulatory Visit | Attending: Adult Health | Admitting: Adult Health

## 2023-01-13 DIAGNOSIS — Z87891 Personal history of nicotine dependence: Secondary | ICD-10-CM

## 2023-01-28 ENCOUNTER — Other Ambulatory Visit: Payer: Self-pay | Admitting: Cardiology

## 2023-03-07 ENCOUNTER — Other Ambulatory Visit: Payer: Self-pay | Admitting: Cardiology

## 2023-05-31 ENCOUNTER — Other Ambulatory Visit: Payer: Self-pay | Admitting: Cardiology

## 2023-06-24 ENCOUNTER — Other Ambulatory Visit: Payer: Self-pay | Admitting: Cardiology

## 2023-08-29 ENCOUNTER — Emergency Department (HOSPITAL_COMMUNITY)
Admission: EM | Admit: 2023-08-29 | Discharge: 2023-08-29 | Disposition: A | Discharge status: Home / Self Care | Payer: Medicare HMO | Attending: Emergency Medicine | Admitting: Emergency Medicine

## 2023-08-29 ENCOUNTER — Encounter (HOSPITAL_COMMUNITY): Payer: Self-pay

## 2023-08-29 ENCOUNTER — Other Ambulatory Visit: Payer: Self-pay

## 2023-08-29 DIAGNOSIS — Z5321 Procedure and treatment not carried out due to patient leaving prior to being seen by health care provider: Secondary | ICD-10-CM | POA: Diagnosis not present

## 2023-08-29 DIAGNOSIS — R531 Weakness: Secondary | ICD-10-CM | POA: Insufficient documentation

## 2023-08-29 NOTE — ED Triage Notes (Signed)
Pt presents to Ed with c/o generalized weakness and not feeling good, pt reports having covid vaccine and flu vaccine both on Wednesday. Pt fell asleep while eating his dinner and spouse was concerned so brought pt in for evaluation. Pt alert and oriented x 4.

## 2023-10-17 ENCOUNTER — Other Ambulatory Visit: Payer: Self-pay | Admitting: *Deleted

## 2023-10-17 DIAGNOSIS — I6523 Occlusion and stenosis of bilateral carotid arteries: Secondary | ICD-10-CM

## 2023-10-22 ENCOUNTER — Emergency Department (HOSPITAL_COMMUNITY): Payer: Medicare HMO

## 2023-10-22 ENCOUNTER — Other Ambulatory Visit: Payer: Self-pay

## 2023-10-22 ENCOUNTER — Emergency Department (HOSPITAL_COMMUNITY)
Admission: EM | Admit: 2023-10-22 | Discharge: 2023-10-22 | Disposition: A | Discharge status: Home / Self Care | Payer: Medicare HMO | Attending: Emergency Medicine | Admitting: Emergency Medicine

## 2023-10-22 ENCOUNTER — Encounter (HOSPITAL_COMMUNITY): Payer: Self-pay | Admitting: Emergency Medicine

## 2023-10-22 DIAGNOSIS — I2510 Atherosclerotic heart disease of native coronary artery without angina pectoris: Secondary | ICD-10-CM | POA: Diagnosis not present

## 2023-10-22 DIAGNOSIS — R059 Cough, unspecified: Secondary | ICD-10-CM | POA: Diagnosis present

## 2023-10-22 DIAGNOSIS — Z951 Presence of aortocoronary bypass graft: Secondary | ICD-10-CM | POA: Diagnosis not present

## 2023-10-22 DIAGNOSIS — Z79899 Other long term (current) drug therapy: Secondary | ICD-10-CM | POA: Insufficient documentation

## 2023-10-22 DIAGNOSIS — Z7982 Long term (current) use of aspirin: Secondary | ICD-10-CM | POA: Diagnosis not present

## 2023-10-22 DIAGNOSIS — Z1152 Encounter for screening for COVID-19: Secondary | ICD-10-CM | POA: Insufficient documentation

## 2023-10-22 DIAGNOSIS — I10 Essential (primary) hypertension: Secondary | ICD-10-CM | POA: Diagnosis not present

## 2023-10-22 DIAGNOSIS — R2243 Localized swelling, mass and lump, lower limb, bilateral: Secondary | ICD-10-CM | POA: Insufficient documentation

## 2023-10-22 DIAGNOSIS — R051 Acute cough: Secondary | ICD-10-CM | POA: Diagnosis not present

## 2023-10-22 LAB — COMPREHENSIVE METABOLIC PANEL
ALT: 17 U/L (ref 0–44)
AST: 20 U/L (ref 15–41)
Albumin: 4.1 g/dL (ref 3.5–5.0)
Alkaline Phosphatase: 99 U/L (ref 38–126)
Anion gap: 8 (ref 5–15)
BUN: 11 mg/dL (ref 8–23)
CO2: 37 mmol/L — ABNORMAL HIGH (ref 22–32)
Calcium: 9.4 mg/dL (ref 8.9–10.3)
Chloride: 92 mmol/L — ABNORMAL LOW (ref 98–111)
Creatinine, Ser: 0.93 mg/dL (ref 0.61–1.24)
GFR, Estimated: >60 mL/min (ref >60)
Glucose, Bld: 110 mg/dL — ABNORMAL HIGH (ref 70–99)
Potassium: 3.9 mmol/L (ref 3.5–5.1)
Sodium: 137 mmol/L (ref 135–145)
Total Bilirubin: 0.6 mg/dL (ref <1.2)
Total Protein: 7.4 g/dL (ref 6.5–8.1)

## 2023-10-22 LAB — CBC WITH DIFFERENTIAL/PLATELET
Abs Immature Granulocytes: 0.01 10*3/uL (ref 0.00–0.07)
Basophils Absolute: 0 10*3/uL (ref 0.0–0.1)
Basophils Relative: 1 %
Eosinophils Absolute: 0.3 10*3/uL (ref 0.0–0.5)
Eosinophils Relative: 4 %
HCT: 43 % (ref 39.0–52.0)
Hemoglobin: 13.7 g/dL (ref 13.0–17.0)
Immature Granulocytes: 0 %
Lymphocytes Relative: 27 %
Lymphs Abs: 1.9 10*3/uL (ref 0.7–4.0)
MCH: 29.7 pg (ref 26.0–34.0)
MCHC: 31.9 g/dL (ref 30.0–36.0)
MCV: 93.3 fL (ref 80.0–100.0)
Monocytes Absolute: 0.8 10*3/uL (ref 0.1–1.0)
Monocytes Relative: 11 %
Neutro Abs: 4.1 10*3/uL (ref 1.7–7.7)
Neutrophils Relative %: 57 %
Platelets: 211 10*3/uL (ref 150–400)
RBC: 4.61 MIL/uL (ref 4.22–5.81)
RDW: 13.8 % (ref 11.5–15.5)
WBC: 7.1 10*3/uL (ref 4.0–10.5)
nRBC: 0 % (ref 0.0–0.2)

## 2023-10-22 LAB — RESP PANEL BY RT-PCR (RSV, FLU A&B, COVID)  RVPGX2
Influenza A by PCR: NEGATIVE
Influenza B by PCR: NEGATIVE
Resp Syncytial Virus by PCR: NEGATIVE
SARS Coronavirus 2 by RT PCR: NEGATIVE

## 2023-10-22 MED ORDER — BENZONATATE 100 MG PO CAPS
200.0000 mg | ORAL_CAPSULE | Freq: Once | ORAL | Status: AC
  Administered 2023-10-22: 200 mg via ORAL
  Filled 2023-10-22: qty 2

## 2023-10-22 MED ORDER — BENZONATATE 100 MG PO CAPS: 200.0000 mg | ORAL_CAPSULE | Freq: Three times a day (TID) | ORAL | 0 refills | Status: DC | PRN

## 2023-10-22 NOTE — ED Triage Notes (Signed)
Pt c/o cough about 2 weeks now with runny nose. Denies fevers/body aches. Wife states he looks like he's tired all the time. Pt denies. Pt is ambulatory with steady gait. A/o. Wet cough noted.

## 2023-10-22 NOTE — Discharge Instructions (Signed)
Take the tessalon perles as prescribed to help with your cough.  If you want your cough to be more productive, you will need to increase your fluid intake while you are taking mucinex which I recommend continuing.  Plan a followup visit with you primary MD if your symptoms are not improving. Your lab tests and chest xray are reassuring today.

## 2023-10-22 NOTE — ED Provider Notes (Signed)
[facility] EMERGENCY DEPARTMENT AT [facility] Provider Note   CSN: [id number] Arrival date & time: 10/22/23  1022     History  Chief Complaint  Patient presents with   Cough    Timothy Simpson is a 77 y.o. male with a history of hypertension, hyperlipidemia, CAD, non-STEMI with history of CABG, carotid stenosis with endarterectomy presenting for evaluation of a cough which has been persistent, keeping him awake at night.  He has had this cough for about 2 weeks, also describes clear rhinorrhea.  His cough is wet sounding but he states he has been unable to cough up any mucus despite trial of Mucinex, although wife at bedside states she does not feel he is hydrating enough for the Mucinex to be helpful.  He denies chest pain, shortness of breath, fevers, chills or bodyaches.  He also denies orthopnea, he does have bilateral ankle edema which she states is unchanged.  He also denies abdominal pain, nausea or vomiting.  The history is provided by the patient.       Home Medications Prior to Admission medications   Medication Sig Start Date End Date Taking? Authorizing Provider  allopurinol (ZYLOPRIM) 100 MG tablet Take 100 mg by mouth daily. 04/28/18  Yes [provider]  amLODipine (NORVASC) 5 MG tablet TAKE 1 TABLET EVERY DAY (NEED MD APPOINTMENT FOR FURTHER REFILLS PLEASE SCHEDULE) 06/24/23  Yes Branch, Dorothe Pea, MD  aspirin EC 81 MG EC tablet Take 1 tablet (81 mg total) by mouth daily. 08/09/18  Yes Doree Fudge M, PA-C  atorvastatin (LIPITOR) 80 MG tablet TAKE 1 TABLET EVERY DAY AT 6PM 03/07/23  Yes Branch, Dorothe Pea, MD  benzonatate (TESSALON) 100 MG capsule Take 2 capsules (200 mg total) by mouth 3 (three) times daily as needed for cough. 10/22/23  Yes Ronrico Dupin, Raynelle Fanning, PA-C  Cholecalciferol (VITAMIN D) 125 MCG (5000 UT) CAPS Take 1 capsule by mouth daily. 01/29/22  Yes [provider]  donepezil (ARICEPT) 10 MG tablet Take 10 mg by mouth daily.  09/23/23  Yes [provider]  ferrous sulfate 325 (65 FE) MG EC tablet Take 1 tablet (325 mg total) by mouth 2 (two) times daily. Patient taking differently: Take 325 mg by mouth every evening. 08/11/18  Yes Barrett, Erin R, PA-C  hydrochlorothiazide (HYDRODIURIL) 25 MG tablet Take 25 mg by mouth daily. 04/08/22  Yes [provider]  HYDROcodone-acetaminophen (NORCO) 5-325 MG tablet Take 1 tablet by mouth every 4 (four) hours as needed (coughing). 03/15/20  Yes Dione Booze, MD  hydroxypropyl methylcellulose / hypromellose (ISOPTO TEARS / GONIOVISC) 2.5 % ophthalmic solution Place 1 drop into both eyes 2 (two) times daily as needed for dry eyes.    Yes [provider]  memantine (NAMENDA) 10 MG tablet Take 10 mg by mouth 2 (two) times daily. 05/09/22  Yes [provider]  metFORMIN (GLUCOPHAGE) 500 MG tablet Take 500 mg by mouth daily. 03/23/22  Yes [provider]  metoprolol tartrate (LOPRESSOR) 25 MG tablet TAKE 1 TABLET TWICE DAILY 01/28/23  Yes Branch, Dorothe Pea, MD  tamsulosin (FLOMAX) 0.4 MG CAPS capsule Take 0.4 mg by mouth at bedtime. 08/25/23  Yes [provider]      Allergies    Patient has no known allergies.    Review of Systems   Review of Systems  Constitutional:  Negative for chills and fever.  HENT:  Positive for rhinorrhea. Negative for congestion, ear pain, sinus pressure, sore throat, trouble swallowing and  voice change.   Eyes:  Negative for discharge.  Respiratory:  Positive for cough. Negative for shortness of breath, wheezing and stridor.   Cardiovascular:  Negative for chest pain.  Gastrointestinal:  Negative for abdominal pain, nausea and vomiting.  Genitourinary: Negative.   All other systems reviewed and are negative.   Physical Exam Updated Vital Signs BP 128/80 (BP Location: Right Arm)   Pulse 72   Temp 98 F (36.7 C) (Oral)   Resp 16   SpO2 90%  Physical Exam Constitutional:      Appearance: He is  well-developed.  HENT:     Head: Normocephalic and atraumatic.     Right Ear: Tympanic membrane and ear canal normal.     Left Ear: Tympanic membrane and ear canal normal.     Nose: Mucosal edema and rhinorrhea present.     Mouth/Throat:     Mouth: Mucous membranes are moist.     Pharynx: Oropharynx is clear. Uvula midline. No oropharyngeal exudate or posterior oropharyngeal erythema.     Tonsils: No tonsillar abscesses.  Eyes:     Conjunctiva/sclera: Conjunctivae normal.  Cardiovascular:     Rate and Rhythm: Normal rate.     Heart sounds: Normal heart sounds.  Pulmonary:     Effort: Pulmonary effort is normal. No respiratory distress.     Breath sounds: No wheezing, rhonchi or rales.     Comments: Frequent wet sounding cough, nonproductive.  No acute respiratory distress. Abdominal:     Palpations: Abdomen is soft.     Tenderness: There is no abdominal tenderness. There is no guarding.  Musculoskeletal:        General: Normal range of motion.     Right lower leg: Edema present.     Left lower leg: Edema present.  Skin:    General: Skin is warm and dry.     Findings: No rash.  Neurological:     General: No focal deficit present.     Mental Status: He is alert and oriented to person, place, and time.     ED Results / Procedures / Treatments   Labs (all labs ordered are listed, but only abnormal results are displayed) Labs Reviewed  COMPREHENSIVE METABOLIC PANEL - Abnormal; Notable for the following components:      Result Value   Chloride 92 (*)    CO2 37 (*)    Glucose, Bld 110 (*)    All other components within normal limits  RESP PANEL BY RT-PCR (RSV, FLU A&B, COVID)  [id number]  CBC WITH DIFFERENTIAL/PLATELET    EKG None  Radiology DG Chest 2 View  Result Date: 10/22/2023 CLINICAL DATA:  Shortness of breath. EXAM: CHEST - 2 VIEW COMPARISON:  01/13/2023. FINDINGS: Bilateral lung fields are clear. Bilateral costophrenic angles are clear. Normal cardio-mediastinal  silhouette. There are surgical staples along the heart border and sternotomy wires, status post CABG (coronary artery bypass graft). No acute osseous abnormalities. The soft tissues are within normal limits. IMPRESSION: *No active cardiopulmonary disease. Electronically Signed   By: Jules Schick M.D.   On: 10/22/2023 15:13    DG Chest 2 View  Result Date: 10/22/2023 CLINICAL DATA:  Shortness of breath. EXAM: CHEST - 2 VIEW COMPARISON:  01/13/2023. FINDINGS: Bilateral lung fields are clear. Bilateral costophrenic angles are clear. Normal cardio-mediastinal silhouette. There are surgical staples along the heart border and sternotomy wires, status post CABG (coronary artery bypass graft). No acute osseous abnormalities. The soft tissues are within normal limits. IMPRESSION: *No  active cardiopulmonary disease. Electronically Signed   By: Jules Schick M.D.   On: 10/22/2023 15:13    Procedures Procedures    Medications Ordered in ED Medications  benzonatate (TESSALON) capsule 200 mg (200 mg Oral Given 10/22/23 1247)    ED Course/ Medical Decision Making/ A&P                                 Medical Decision Making Patient presenting with a 2-week history of cough associated with coryza symptoms, he denies shortness of breath, orthopnea, chest pain, fevers.  He is found no alleviators for his cough.  He was given Tessalon pearls here while we waited for his test results and he states he did offer significant relief from his cough.  He is therefore prescribed additional Tessalon.  He has no orthopnea, no history of CHF, denies chest pain.  His respiratory panel is negative, his chest x-ray is clear, he was stable at time of discharge, I recommended follow-up with his primary provider if his symptoms persist or do not resolve over the next week.  Amount and/or Complexity of Data Reviewed Labs: ordered.    Details: Reviewed respiratory panel negative, his c-Met is unremarkable, CBC  normal. Radiology: ordered.    Details: Chest x-ray reviewed and agree with interpretation, no acute pulmonary findings  Risk Prescription drug management.           Final Clinical Impression(s) / ED Diagnoses Final diagnoses:  Acute cough    Rx / DC Orders ED Discharge Orders          Ordered    benzonatate (TESSALON) 100 MG capsule  3 times daily PRN        10/22/23 1725              Burgess Amor, PA-C 10/23/23 2147    Rondel Baton, MD 10/25/23 1659

## 2023-10-22 NOTE — ED Notes (Signed)
Patient ambulating in hallway, O2 sat 90-91% on RA. No shortness of breath or respiratory distress noted.

## 2023-10-27 ENCOUNTER — Ambulatory Visit: Payer: Medicare HMO | Admitting: Nurse Practitioner

## 2023-10-30 ENCOUNTER — Emergency Department (HOSPITAL_COMMUNITY)
Admission: EM | Admit: 2023-10-30 | Discharge: 2023-10-30 | Disposition: A | Discharge status: Home / Self Care | Payer: Medicare HMO | Attending: Emergency Medicine | Admitting: Emergency Medicine

## 2023-10-30 ENCOUNTER — Emergency Department (HOSPITAL_COMMUNITY): Payer: Medicare HMO

## 2023-10-30 ENCOUNTER — Other Ambulatory Visit: Payer: Self-pay

## 2023-10-30 ENCOUNTER — Encounter (HOSPITAL_COMMUNITY): Payer: Self-pay

## 2023-10-30 DIAGNOSIS — S0990XA Unspecified injury of head, initial encounter: Secondary | ICD-10-CM

## 2023-10-30 DIAGNOSIS — S0181XA Laceration without foreign body of other part of head, initial encounter: Secondary | ICD-10-CM | POA: Insufficient documentation

## 2023-10-30 DIAGNOSIS — W19XXXA Unspecified fall, initial encounter: Secondary | ICD-10-CM

## 2023-10-30 DIAGNOSIS — W06XXXA Fall from bed, initial encounter: Secondary | ICD-10-CM | POA: Insufficient documentation

## 2023-10-30 DIAGNOSIS — M542 Cervicalgia: Secondary | ICD-10-CM | POA: Diagnosis not present

## 2023-10-30 DIAGNOSIS — R0789 Other chest pain: Secondary | ICD-10-CM | POA: Insufficient documentation

## 2023-10-30 DIAGNOSIS — F0390 Unspecified dementia without behavioral disturbance: Secondary | ICD-10-CM | POA: Insufficient documentation

## 2023-10-30 DIAGNOSIS — T148XXA Other injury of unspecified body region, initial encounter: Secondary | ICD-10-CM

## 2023-10-30 DIAGNOSIS — Z7982 Long term (current) use of aspirin: Secondary | ICD-10-CM | POA: Insufficient documentation

## 2023-10-30 NOTE — ED Triage Notes (Signed)
Pt came from home and fell out of bed, rolled over, hit his head on the floor. Pt has small cut to his right forehead and complains of chest pain when moving his head.

## 2023-10-30 NOTE — Discharge Instructions (Addendum)
Head CT without any acute findings.  Chest x-ray without any acute findings as well.

## 2023-10-30 NOTE — ED Provider Notes (Addendum)
[place] EMERGENCY DEPARTMENT AT [facility] Provider Note   CSN: [id number] Arrival date & time: 10/30/23  6967     History  Chief Complaint  Patient presents with   Timothy Simpson is a 77 y.o. male.  Patient brought in by family member.  Patient fell out of bed yet she rolled over fell out of bed hit his head right forehead on the floor.  Has a small abrasion measuring about 2 cm linear in nature.  No other complaint.  No lower extremity no upper extremity complaint.  Patient is on Aricept.  So there is a component of some dementia.  Patient is not on a blood thinner.  But does take an aspirin a day.  Patient denies any neck pain.  States a little bit of movement of his neck left and right he does get a little bit of mild discomfort left anterior chest.  But at rest has no discomfort in the chest at all.       Home Medications Prior to Admission medications   Medication Sig Start Date End Date Taking? Authorizing Provider  allopurinol (ZYLOPRIM) 100 MG tablet Take 100 mg by mouth daily. 04/28/18   [provider]  amLODipine (NORVASC) 5 MG tablet TAKE 1 TABLET EVERY DAY (NEED MD APPOINTMENT FOR FURTHER REFILLS PLEASE SCHEDULE) 06/24/23   Antoine Poche, MD  aspirin EC 81 MG EC tablet Take 1 tablet (81 mg total) by mouth daily. 08/09/18   Ardelle Balls, PA-C  atorvastatin (LIPITOR) 80 MG tablet TAKE 1 TABLET EVERY DAY AT 6PM 03/07/23   Antoine Poche, MD  benzonatate (TESSALON) 100 MG capsule Take 2 capsules (200 mg total) by mouth 3 (three) times daily as needed for cough. 10/22/23   Burgess Amor, PA-C  Cholecalciferol (VITAMIN D) 125 MCG (5000 UT) CAPS Take 1 capsule by mouth daily. 01/29/22   [provider]  donepezil (ARICEPT) 10 MG tablet Take 10 mg by mouth daily. 09/23/23   [provider]  ferrous sulfate 325 (65 FE) MG EC tablet Take 1 tablet (325 mg total) by mouth 2 (two) times daily. Patient taking  differently: Take 325 mg by mouth every evening. 08/11/18   Barrett, Erin R, PA-C  hydrochlorothiazide (HYDRODIURIL) 25 MG tablet Take 25 mg by mouth daily. 04/08/22   [provider]  HYDROcodone-acetaminophen (NORCO) 5-325 MG tablet Take 1 tablet by mouth every 4 (four) hours as needed (coughing). 03/15/20   Dione Booze, MD  hydroxypropyl methylcellulose / hypromellose (ISOPTO TEARS / GONIOVISC) 2.5 % ophthalmic solution Place 1 drop into both eyes 2 (two) times daily as needed for dry eyes.     [provider]  memantine (NAMENDA) 10 MG tablet Take 10 mg by mouth 2 (two) times daily. 05/09/22   [provider]  metFORMIN (GLUCOPHAGE) 500 MG tablet Take 500 mg by mouth daily. 03/23/22   [provider]  metoprolol tartrate (LOPRESSOR) 25 MG tablet TAKE 1 TABLET TWICE DAILY 01/28/23   Antoine Poche, MD  tamsulosin (FLOMAX) 0.4 MG CAPS capsule Take 0.4 mg by mouth at bedtime. 08/25/23   [provider]      Allergies    Patient has no known allergies.    Review of Systems   Review of Systems  Constitutional:  Negative for chills and fever.  HENT:  Negative for ear pain and sore throat.   Eyes:  Negative for pain and visual disturbance.  Respiratory:  Negative for cough and shortness of breath.   Cardiovascular:  Positive for chest pain. Negative for palpitations.  Gastrointestinal:  Negative for abdominal pain and vomiting.  Genitourinary:  Negative for dysuria and hematuria.  Musculoskeletal:  Negative for arthralgias and back pain.  Skin:  Positive for wound. Negative for color change and rash.  Neurological:  Positive for headaches. Negative for seizures and syncope.  All other systems reviewed and are negative.   Physical Exam Updated Vital Signs BP 106/72 (BP Location: Left Arm)   Pulse 69   Temp 97.7 F (36.5 C) (Oral)   Resp 19   Ht 1.854 m (6\' 1" )   Wt 94.3 kg   SpO2 95%   BMI 27.44 kg/m  Physical Exam Vitals and nursing note  reviewed.  Constitutional:      General: He is not in acute distress.    Appearance: Normal appearance. He is well-developed.  HENT:     Head: Normocephalic.     Comments: Linear superficial abrasion laceration to right forehead area.  Measuring about 2 cm.    Mouth/Throat:     Mouth: Mucous membranes are moist.  Eyes:     Extraocular Movements: Extraocular movements intact.     Conjunctiva/sclera: Conjunctivae normal.     Pupils: Pupils are equal, round, and reactive to light.  Neck:     Comments: No tenderness to palpation to the posterior aspect of the neck. Cardiovascular:     Rate and Rhythm: Normal rate and regular rhythm.     Heart sounds: No murmur heard. Pulmonary:     Effort: Pulmonary effort is normal. No respiratory distress.     Breath sounds: Normal breath sounds. No wheezing.     Comments: Despite the discomfort when he moves his neck left or right and the left anterior part of the chest no tenderness to palpation in that area no obvious clavicle deformity. Chest:     Chest wall: No tenderness.  Abdominal:     Palpations: Abdomen is soft.     Tenderness: There is no abdominal tenderness.  Musculoskeletal:        General: No swelling, tenderness or signs of injury.     Cervical back: Normal range of motion and neck supple. No rigidity or tenderness.     Right lower leg: No edema.     Left lower leg: No edema.     Comments: No evidence of any extremity injury.  Moves all 4 extremities without any difficulty.  No hip pain.  No tenderness to thoracic or lumbar spine.  Skin:    General: Skin is warm and dry.     Capillary Refill: Capillary refill takes less than 2 seconds.  Neurological:     General: No focal deficit present.     Mental Status: He is alert. Mental status is at baseline.     Cranial Nerves: No cranial nerve deficit.     Sensory: No sensory deficit.     Motor: No weakness.  Psychiatric:        Mood and Affect: Mood normal.     ED Results /  Procedures / Treatments   Labs (all labs ordered are listed, but only abnormal results are displayed) Labs Reviewed - No data to display  EKG None  Radiology No results found.  Procedures Procedures    Medications Ordered in ED Medications - No data to display  ED Course/ Medical Decision Making/ A&P  Medical Decision Making Amount and/or Complexity of Data Reviewed Radiology: ordered.  Will get head CT due to the fall and head trauma.  Neck is nontender full range of motion do not feel need CT cervical spine.  But it is interesting that when he moves his neck left or right he gets some discomfort in the left anterior chest so will get chest x-ray of that area and will get EKG.  Do not feel that we need labs.  Patient is alert and follows commands seems to be baseline does have a history of some dementia.  CT head without any acute findings.  Chest x-ray without any acute findings.  No evidence of any bony abnormalities.  EKG not done.  Based on patient's overall situation do not feel that we need an EKG.  Just has discomfort in the left chest when he moves his neck.  No pain in the neck at all.  Patient stable for discharge home.  Final Clinical Impression(s) / ED Diagnoses Final diagnoses:  Fall, initial encounter  Injury of head, initial encounter  Abrasion    Rx / DC Orders ED Discharge Orders     None         Vanetta Mulders, MD 10/30/23 0800    Vanetta Mulders, MD 10/30/23 [phone]

## 2023-11-04 ENCOUNTER — Ambulatory Visit (HOSPITAL_COMMUNITY)
Admission: RE | Admit: 2023-11-04 | Discharge: 2023-11-04 | Disposition: A | Discharge status: Home / Self Care | Payer: Medicare HMO | Source: Ambulatory Visit | Attending: Vascular Surgery | Admitting: Vascular Surgery

## 2023-11-04 ENCOUNTER — Ambulatory Visit: Payer: Medicare HMO | Admitting: Physician Assistant

## 2023-11-04 VITALS — BP 96/66 | HR 86 | Temp 98.0°F | Resp 18 | Ht 73.0 in | Wt 206.3 lb

## 2023-11-04 DIAGNOSIS — I6523 Occlusion and stenosis of bilateral carotid arteries: Secondary | ICD-10-CM

## 2023-11-04 NOTE — Progress Notes (Signed)
History of Present Illness:  Patient is a 77 y.o. year old male who presents for evaluation of carotid stenosis.  His wife stated he had to go to the ED due to a fall out of the bed and his his head.  They did a CT scan which was negative for infart or injury to the skull.  He has recently been diagnosed with sleep apnea and is waiting for a CPAP.  His wife states he is up at all hours of the night and does not sleep well.  He falls asleep during the day a lot.  He is not very active.  He has a history of high grade stenosis right ICA s/p right CEA by Dr. Chestine Spore 10/18/2019.  The patient denies symptoms of TIA, amaurosis, or stroke.  He is medically managed on ASA and Lipitor daily.      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 Jena Gauss: Normal.  For history of tubulovillous adenoma back in 2001, five-year follow-up recommended.   COLONOSCOPY  2009   Dr. Lovena Neighbours: 6 mm polyp in the ascending segment and one in the sigmoid colon removed.  Pathology showed tubular adenomas.  5-year surveillance colonoscopy recommended.   COLONOSCOPY N/A 07/07/2019   Procedure: COLONOSCOPY;  Surgeon: Corbin Ade, MD;  Location: [facility]ITE;  Service: Endoscopy;  Laterality: N/A;  1:00pm   CORONARY ARTERY BYPASS GRAFT N/A 08/05/2018   Procedure: CORONARY ARTERY BYPASS GRAFTING (CABG) x3. Endoscopic Saphenous vein harvest.;  Surgeon: Purcell Nails, MD;  Location: [facility] OR;  Service: Open Heart Surgery;  Laterality: N/A;   CORONARY PRESSURE/FFR STUDY N/A 08/04/2018   Procedure: INTRAVASCULAR  PRESSURE WIRE/FFR STUDY;  Surgeon: Kathleene Hazel, MD;  Location: [facility] INVASIVE CV LAB;  Service: Cardiovascular;  Laterality: N/A;   ENDARTERECTOMY Right 10/18/2019   Procedure: ENDARTERECTOMY CAROTID;  Surgeon: Cephus Shelling, MD;  Location: [facility] OR;  Service: Vascular;  Laterality: Right;   LEFT HEART CATH AND CORONARY ANGIOGRAPHY N/A 07/30/2018   Procedure: LEFT HEART CATH AND CORONARY ANGIOGRAPHY;  Surgeon: Corky Crafts, MD;  Location: [facility] INVASIVE CV LAB;  Service: Cardiovascular;  Laterality: N/A;   PATCH ANGIOPLASTY Right 10/18/2019   Procedure: Patch Angioplasty;  Surgeon: Cephus Shelling, MD;  Location: [facility] OR;  Service: Vascular;  Laterality: Right;   POLYPECTOMY  07/07/2019   Procedure: POLYPECTOMY;  Surgeon: Corbin Ade, MD;  Location: [facility];  Service: Endoscopy;;   TEE WITHOUT CARDIOVERSION N/A 08/05/2018   Procedure: TRANSESOPHAGEAL ECHOCARDIOGRAM (TEE);  Surgeon: Purcell Nails, MD;  Location: [facility] OR;  Service: Open Heart Surgery;  Laterality: N/A;     Social History Social History   Tobacco Use   Smoking status: Former    Current packs/day: 0.00    Average packs/day: 0.3 packs/day for 40.0 years (10.0 ttl pk-yrs)    Types: Cigarettes    Start date: 11/01/1979    Quit date: 11/01/2019    Years since  quitting: 4.0   Smokeless tobacco: Never   Tobacco comments:    he sated that he would quit if he was told he had to.   Vaping Use   Vaping status: Never Used  Substance Use Topics   Alcohol use: No   Drug use: No    Family History Family History  Problem Relation Age of Onset   Hypertension Father     Allergies  No Known Allergies   Current Outpatient Medications  Medication Sig Dispense Refill   allopurinol (ZYLOPRIM) 100 MG tablet Take 100 mg by mouth daily.     amLODipine (NORVASC) 5 MG tablet TAKE 1 TABLET EVERY DAY (NEED MD APPOINTMENT FOR FURTHER REFILLS PLEASE SCHEDULE) 30 tablet 0   aspirin EC 81 MG EC tablet  Take 1 tablet (81 mg total) by mouth daily.     atorvastatin (LIPITOR) 80 MG tablet TAKE 1 TABLET EVERY DAY AT 6PM 30 tablet 0   benzonatate (TESSALON) 100 MG capsule Take 2 capsules (200 mg total) by mouth 3 (three) times daily as needed for cough. 30 capsule 0   Cholecalciferol (VITAMIN D) 125 MCG (5000 UT) CAPS Take 1 capsule by mouth daily.     donepezil (ARICEPT) 10 MG tablet Take 10 mg by mouth daily.     ferrous sulfate 325 (65 FE) MG EC tablet Take 1 tablet (325 mg total) by mouth 2 (two) times daily. (Patient taking differently: Take 325 mg by mouth every evening.) 60 tablet 3   hydrochlorothiazide (HYDRODIURIL) 25 MG tablet Take 25 mg by mouth daily.     HYDROcodone-acetaminophen (NORCO) 5-325 MG tablet Take 1 tablet by mouth every 4 (four) hours as needed (coughing). 6 tablet 0   hydroxypropyl methylcellulose / hypromellose (ISOPTO TEARS / GONIOVISC) 2.5 % ophthalmic solution Place 1 drop into both eyes 2 (two) times daily as needed for dry eyes.      memantine (NAMENDA) 10 MG tablet Take 10 mg by mouth 2 (two) times daily.     metFORMIN (GLUCOPHAGE) 500 MG tablet Take 500 mg by mouth daily.     metoprolol tartrate (LOPRESSOR) 25 MG tablet TAKE 1 TABLET TWICE DAILY 60 tablet 0   tamsulosin (FLOMAX) 0.4 MG CAPS capsule Take 0.4 mg by mouth at bedtime.     No current facility-administered medications for this visit.    ROS:   General:  No weight loss, Fever, chills  HEENT: No recent headaches, no nasal bleeding, no visual changes, no sore throat  Neurologic: No dizziness, blackouts, seizures. No recent symptoms of stroke or mini- stroke. No recent episodes of slurred speech, or temporary blindness.  Cardiac: No recent episodes of chest pain/pressure, no shortness of breath at rest.  No shortness of breath with exertion.  Denies history of atrial fibrillation or irregular heartbeat  Vascular: No history of rest pain in feet.  No history of claudication.  No history of non-healing  ulcer, No history of DVT   Pulmonary: No home oxygen, no productive cough, no hemoptysis,  No asthma or wheezing  Musculoskeletal:  [ ]  Arthritis, [ ]  Low back pain,  [ ]  Joint pain  Hematologic:No history of hypercoagulable state.  No history of easy bleeding.  No history of anemia  Gastrointestinal: No hematochezia or melena,  No gastroesophageal reflux, no trouble swallowing  Urinary: [ ]  chronic Kidney disease, [ ]  on HD - [ ]  MWF or [ ]  TTHS, [ ]  Burning with urination, [ ]  Frequent urination, [ ]  Difficulty urinating;  Skin: No rashes  Psychological: No history of anxiety,  No history of depression   Physical Examination  Vitals:   11/04/23 1450 11/04/23 1457  BP: 105/69 96/66  Pulse: 86   Resp: 18   Temp: 98 F (36.7 C)   TempSrc: Temporal   SpO2: 90%   Weight: 206 lb 4.8 oz (93.6 kg)   Height: 6\' 1"  (1.854 m)     Body mass index is 27.22 kg/m.  General:  Alert and oriented, no acute distress HEENT: Normal Neck: No bruit or JVD Pulmonary: Clear to auscultation bilaterally Cardiac: Regular Rate and Rhythm without murmur Gastrointestinal: Soft, non-tender, non-distended, no mass, no scars Skin: No rash Extremity Pulses:   radial pulses  bilaterally Musculoskeletal: No deformity or edema  Neurologic: Upper and lower extremity motor 5/5 and symmetric  DATA:  Summary:  Right Carotid: Velocities in the right ICA are consistent with a 1-39%  stenosis.   Left Carotid: Velocities in the left ICA are consistent with a 1-39%  stenosis.   Vertebrals: Right vertebral artery demonstrates antegrade flow. Left  vertebral             artery was not visualized.    ASSESSMENT/PLAN: Carotid stenosis s/p right CEA by Dr. Chestine Spore for asymptomatic stenosis > 80% 2020.   The carotid duplex today shows B ICA < 39% stenosis.  I reviewed the CT 10/30/23 which showed no acute infarct after a fall.  Hopefully he will rest better once he starts using the CPAP and he can sleep  better.  He will f/u for repeat duplex in 1 year.       Mosetta Pigeon PA-C [facility] Office: [phone]  MD in clinic [place]

## 2023-11-05 ENCOUNTER — Encounter: Payer: Self-pay | Admitting: Student

## 2023-11-05 ENCOUNTER — Ambulatory Visit: Payer: Medicare HMO | Attending: Nurse Practitioner | Admitting: Student

## 2023-11-05 VITALS — BP 110/60 | HR 81 | Ht 73.0 in | Wt 206.8 lb

## 2023-11-05 DIAGNOSIS — I6523 Occlusion and stenosis of bilateral carotid arteries: Secondary | ICD-10-CM | POA: Diagnosis not present

## 2023-11-05 DIAGNOSIS — I10 Essential (primary) hypertension: Secondary | ICD-10-CM | POA: Diagnosis not present

## 2023-11-05 DIAGNOSIS — I2510 Atherosclerotic heart disease of native coronary artery without angina pectoris: Secondary | ICD-10-CM | POA: Diagnosis not present

## 2023-11-05 DIAGNOSIS — E782 Mixed hyperlipidemia: Secondary | ICD-10-CM

## 2023-11-05 MED ORDER — AMLODIPINE BESYLATE 5 MG PO TABS: 5.0000 mg | ORAL_TABLET | Freq: Every day | ORAL | 3 refills | Status: DC

## 2023-11-05 MED ORDER — METOPROLOL TARTRATE 25 MG PO TABS: 25.0000 mg | ORAL_TABLET | Freq: Two times a day (BID) | ORAL | 3 refills | Status: DC

## 2023-11-05 MED ORDER — ATORVASTATIN CALCIUM 80 MG PO TABS: 80.0000 mg | ORAL_TABLET | Freq: Every day | ORAL | 3 refills | Status: DC

## 2023-11-05 NOTE — Progress Notes (Signed)
Cardiology Office Note    Date:  11/05/2023  ID:  Timothy Simpson, DOB May 09, 1946, MRN [id number] Cardiologist: Dina Rich, MD    History of Present Illness:    Timothy Simpson is a 77 y.o. male with past medical history of CAD (s/p CABG in 08/2018 with LIMA-LAD and seq-SVG to medial and lateral branches of RI), HTN, HLD, carotid artery stenosis (s/p R CEA in 10/2019) and prior tobacco use who presents to the office today for annual follow-up.   He was last examined by Dr. Wyline Simpson in 11/2021 and denied any recent anginal symptoms at that time. BP was soft at 100/60 and Amlodipine was reduced to 5mg  daily with him being continued on ASA 81mg  daily, Atorvastatin 80mg  daily and Lopressor 25mg  BID. He did meet with Vascular Surgery earlier this month and repeat carotid dopplers showed < 1-39% stenoses bilaterally and he was informed to follow-up in 1 year.   In talking with the patient and his wife today, he reports things have been stable from a cardiac perspective.  He denies any recent chest pain or dyspnea on exertion when doing routine activities. No specific orthopnea, PND or pitting edema. He did recently complete a sleep study earlier this week in [place], [place] and was informed he had sleep apnea with plans to try CPAP. His wife is unsure if he will be able to tolerate this as he does have dementia and she is concerned he will remove this during the night.  Studies Reviewed:   EKG: EKG is ordered today and demonstrates:   EKG Interpretation Date/Time:  Wednesday November 05 2023 15:06:10 EST Ventricular Rate:  73 PR Interval:  180 QRS Duration:  98 QT Interval:  386 QTC Calculation: 425 R Axis:   -6  Text Interpretation: Sinus rhythm with marked sinus arrhythmia Incomplete right bundle branch block Confirmed by Randall An ([id number]) on 11/05/2023 3:59:07 PM       Limited Echo: 08/2018 Study Conclusions   - Left ventricle: The cavity size was normal. Systolic  function was    normal. The estimated ejection fraction was in the range of 55%    to 60%. Wall motion was normal; there were no regional wall    motion abnormalities. Features are consistent with a pseudonormal    left ventricular filling pattern, with concomitant abnormal    relaxation and increased filling pressure (grade 2 diastolic    dysfunction).  - Aortic valve: Valve mobility was restricted. Transvalvular    velocity was within the normal range. There was no stenosis.    There was no regurgitation.  - Mitral valve: Transvalvular velocity was within the normal range.    There was no evidence for stenosis. There was mild regurgitation.  - Right ventricle: The cavity size was normal. Wall thickness was    normal. Systolic function was normal.  - Atrial septum: No defect or patent foramen ovale was identified    by color flow Doppler.  - Tricuspid valve: There was mild regurgitation.    Carotid Dopplers: 11/2023 Summary:  Right Carotid: Velocities in the right ICA are consistent with a 1-39%  stenosis.   Left Carotid: Velocities in the left ICA are consistent with a 1-39%  stenosis.   Vertebrals: Right vertebral artery demonstrates antegrade flow. Left  vertebral             artery was not visualized.    Physical Exam:   VS:  BP 110/60   Pulse 81   Ht  6\' 1"  (1.854 m)   Wt 206 lb 12.8 oz (93.8 kg)   SpO2 90%   BMI 27.28 kg/m    Wt Readings from Last 3 Encounters:  11/05/23 206 lb 12.8 oz (93.8 kg)  11/04/23 206 lb 4.8 oz (93.6 kg)  10/30/23 208 lb (94.3 kg)     GEN: Well nourished, well developed male appearing in no acute distress NECK: No JVD; No carotid bruits CARDIAC: RRR, no murmurs, rubs, gallops RESPIRATORY:  Clear to auscultation without rales, wheezing or rhonchi  ABDOMEN: Appears non-distended. No obvious abdominal masses. EXTREMITIES: No clubbing or cyanosis. No pitting edema.  Distal pedal pulses are 2+ bilaterally.   Assessment and Plan:   1.  CAD - He is s/p CABG in 08/2018 with LIMA-LAD and seq SVG to medial and lateral branches of RI. His activity has decreased over the past several months in the setting of dementia but he denies any anginal symptoms when doing routine activities around his home. His wife also reports he has not complained of any chest pain. EKG today is without acute ST changes. - Continue current medical therapy with ASA 81 mg daily, Atorvastatin 80 mg daily and Lopressor 25 mg twice daily.  2. Carotid Artery Stenosis - He did undergo a R CEA in 10/2019 and recent dopplers earlier this month showed < 1-39% stenoses bilaterally with plans for follow-up imaging in 1 year. Continue ASA and statin therapy.  3. HTN - BP is well-controlled at 110/60 during today's visit. Continue current medication regimen with Amlodipine 5 mg daily, HCTZ 25 mg daily and Lopressor 25 mg twice daily. Creatinine was stable at 0.93 when checked in 10/2023.  4. HLD - By review of [facility] DXA, his LDL was at 84 when checked in 08/2023 but this was previously greater than 100. He has made dietary changes in the interim. Would continue Atorvastatin 80 mg daily for now. If LDL remains above goal, can switch to Crestor 40 mg daily or add Zetia 10 mg daily.  Signed, Ellsworth Lennox, PA-C

## 2023-11-05 NOTE — Patient Instructions (Signed)
Medication Instructions:  Your physician recommends that you continue on your current medications as directed. Please refer to the Current Medication list given to you today.  *If you need a refill on your cardiac medications before your next appointment, please call your pharmacy*   Lab Work: NONE   If you have labs (blood work) drawn today and your tests are completely normal, you will receive your results only by: MyChart Message (if you have MyChart) OR A paper copy in the mail If you have any lab test that is abnormal or we need to change your treatment, we will call you to review the results.   Testing/Procedures: None    Follow-Up: At [facility], you and your health needs are our priority.  As part of our continuing mission to provide you with exceptional heart care, we have created designated Provider Care Teams.  These Care Teams include your primary Cardiologist (physician) and Advanced Practice Providers (APPs -  Physician Assistants and Nurse Practitioners) who all work together to provide you with the care you need, when you need it.  We recommend signing up for the patient portal called "MyChart".  Sign up information is provided on this After Visit Summary.  MyChart is used to connect with patients for Virtual Visits (Telemedicine).  Patients are able to view lab/test results, encounter notes, upcoming appointments, etc.  Non-urgent messages can be sent to your provider as well.   To learn more about what you can do with MyChart, go to [email or web address].    Your next appointment:   1 year(s)  Provider:   You may see Dina Rich, MD or one of the following Advanced Practice Providers on your designated Care Team:   Randall An, PA-C  Jacolyn Reedy, [place]     Other Instructions Thank you for choosing [facility]!

## 2023-11-07 ENCOUNTER — Other Ambulatory Visit: Payer: Self-pay

## 2023-11-07 DIAGNOSIS — I6523 Occlusion and stenosis of bilateral carotid arteries: Secondary | ICD-10-CM

## 2024-04-20 ENCOUNTER — Other Ambulatory Visit (HOSPITAL_COMMUNITY): Payer: Self-pay | Admitting: Adult Health

## 2024-04-20 DIAGNOSIS — R053 Chronic cough: Secondary | ICD-10-CM

## 2024-04-23 ENCOUNTER — Ambulatory Visit (HOSPITAL_COMMUNITY)
Admission: RE | Admit: 2024-04-23 | Discharge: 2024-04-23 | Disposition: A | Discharge status: Home / Self Care | Source: Ambulatory Visit | Attending: Adult Health | Admitting: Adult Health

## 2024-04-23 DIAGNOSIS — R053 Chronic cough: Secondary | ICD-10-CM | POA: Insufficient documentation

## 2024-05-12 ENCOUNTER — Telehealth: Payer: Self-pay | Admitting: *Deleted

## 2024-05-12 ENCOUNTER — Encounter: Payer: Self-pay | Admitting: *Deleted

## 2024-05-12 ENCOUNTER — Ambulatory Visit (INDEPENDENT_AMBULATORY_CARE_PROVIDER_SITE_OTHER): Admitting: Internal Medicine

## 2024-05-12 ENCOUNTER — Encounter: Payer: Self-pay | Admitting: Internal Medicine

## 2024-05-12 VITALS — BP 104/66 | HR 83 | Temp 97.1°F | Ht 73.0 in | Wt 188.2 lb

## 2024-05-12 DIAGNOSIS — Z860101 Personal history of adenomatous and serrated colon polyps: Secondary | ICD-10-CM

## 2024-05-12 DIAGNOSIS — Z09 Encounter for follow-up examination after completed treatment for conditions other than malignant neoplasm: Secondary | ICD-10-CM

## 2024-05-12 DIAGNOSIS — Z860100 Personal history of colon polyps, unspecified: Secondary | ICD-10-CM

## 2024-05-12 NOTE — Patient Instructions (Signed)
 We will schedule you for colonoscopy due to your history of polyps prior.  It was very nice meeting both you today.  Dr. Mordechai April

## 2024-05-12 NOTE — Progress Notes (Signed)
 Primary Care Physician:  Brantley Caldwell, NP Primary Gastroenterologist:  Dr. Mordechai April  Chief Complaint  Patient presents with   New Patient (Initial Visit)    Patient here today as a new patient as he is in need of a tcs. Denies any current gi issues.    HPI:   Timothy Simpson is a 78 y.o. male who presents to the clinic today to discuss surveillance colonoscopy.  Last colonoscopy 2020 with 10 mm cecal polyp removed, tubular adenoma on path.  1 other sigmoid polyp removed hyperplastic benign.  Patient denies any family history of colorectal malignancy.  No melena hematochezia.  No abdominal pain.  No unintentional weight loss.  Denies any upper GI symptoms including heartburn, reflux, dysphagia/odynophagia, epigastric or chest pain.  Significant cardiac history though all of this appears stable currently.  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 Riley Cheadle: Normal.  For history of tubulovillous adenoma back in 2001, five-year follow-up recommended.   COLONOSCOPY  2009   Dr. Narvis Bad: 6 mm polyp in the ascending segment and one in the sigmoid colon removed.  Pathology showed tubular adenomas.  5-year surveillance colonoscopy recommended.   COLONOSCOPY N/A 07/07/2019   Procedure: COLONOSCOPY;  Surgeon: Suzette Espy, MD;  Location: [facility];  Service: Endoscopy;  Laterality: N/A;  1:00pm   CORONARY ARTERY BYPASS GRAFT N/A 08/05/2018   Procedure: CORONARY ARTERY BYPASS GRAFTING (CABG) x3. Endoscopic Saphenous vein harvest.;   Surgeon: Gardenia Jump, MD;  Location: [facility] OR;  Service: Open Heart Surgery;  Laterality: N/A;   CORONARY PRESSURE/FFR STUDY N/A 08/04/2018   Procedure: INTRAVASCULAR PRESSURE WIRE/FFR STUDY;  Surgeon: Odie Benne, MD;  Location: [facility] INVASIVE CV LAB;  Service: Cardiovascular;  Laterality: N/A;   ENDARTERECTOMY Right 10/18/2019   Procedure: ENDARTERECTOMY CAROTID;  Surgeon: Young Hensen, MD;  Location: [facility] OR;  Service: Vascular;  Laterality: Right;   LEFT HEART CATH AND CORONARY ANGIOGRAPHY N/A 07/30/2018   Procedure: LEFT HEART CATH AND CORONARY ANGIOGRAPHY;  Surgeon: Lucendia Rusk, MD;  Location: [facility] INVASIVE CV LAB;  Service: Cardiovascular;  Laterality: N/A;   PATCH ANGIOPLASTY Right 10/18/2019   Procedure: Patch Angioplasty;  Surgeon: Young Hensen, MD;  Location: [facility] OR;  Service: Vascular;  Laterality: Right;   POLYPECTOMY  07/07/2019   Procedure: POLYPECTOMY;  Surgeon: Suzette Espy, MD;  Location: [facility];  Service: Endoscopy;;   TEE WITHOUT CARDIOVERSION N/A 08/05/2018   Procedure: TRANSESOPHAGEAL ECHOCARDIOGRAM (TEE);  Surgeon: Gardenia Jump, MD;  Location: [facility] OR;  Service: Open Heart Surgery;  Laterality: N/A;    Current Outpatient Medications  Medication Sig Dispense Refill   allopurinol  (ZYLOPRIM ) 100 MG tablet Take 100 mg by mouth daily.     amLODipine  (NORVASC ) 5 MG tablet Take 1 tablet (5 mg total) by mouth daily. 90 tablet 3   aspirin  EC 81 MG EC tablet Take 1 tablet (81 mg total) by mouth daily.  atorvastatin  (LIPITOR ) 80 MG tablet Take 1 tablet (80 mg total) by mouth daily. 90 tablet 3   Cholecalciferol (VITAMIN D) 125 MCG (5000 UT) CAPS Take 1 capsule by mouth daily.     donepezil  (ARICEPT ) 10 MG tablet Take 10 mg by mouth daily.     ferrous sulfate  325 (65 FE) MG EC tablet Take 1 tablet (325 mg total) by mouth 2 (two) times daily. (Patient taking differently: Take 325 mg by mouth every evening.) 60 tablet 3    hydrochlorothiazide (HYDRODIURIL) 25 MG tablet Take 25 mg by mouth daily.     memantine (NAMENDA) 10 MG tablet Take 10 mg by mouth 2 (two) times daily.     metFORMIN (GLUCOPHAGE) 500 MG tablet Take 500 mg by mouth daily.     metoprolol  tartrate (LOPRESSOR ) 25 MG tablet Take 1 tablet (25 mg total) by mouth 2 (two) times daily. 180 tablet 3   tamsulosin (FLOMAX) 0.4 MG CAPS capsule Take 0.4 mg by mouth at bedtime.     hydroxypropyl methylcellulose / hypromellose (ISOPTO TEARS / GONIOVISC) 2.5 % ophthalmic solution Place 1 drop into both eyes 2 (two) times daily as needed for dry eyes.  (Patient not taking: Reported on 05/12/2024)     No current facility-administered medications for this visit.    Allergies as of 05/12/2024   (No Known Allergies)    Family History  Problem Relation Age of Onset   Hypertension Father     Social History   Socioeconomic History   Marital status: Married    Spouse name: Not on file   Number of children: Not on file   Years of education: Not on file   Highest education level: Not on file  Occupational History   Not on file  Tobacco Use   Smoking status: Former    Current packs/day: 0.00    Average packs/day: 0.3 packs/day for 40.0 years (10.0 ttl pk-yrs)    Types: Cigarettes    Start date: 11/01/1979    Quit date: 11/01/2019    Years since quitting: 4.5   Smokeless tobacco: Never   Tobacco comments:    he sated that he would quit if he was told he had to.   Vaping Use   Vaping status: Never Used  Substance and Sexual Activity   Alcohol use: No   Drug use: No   Sexual activity: Not on file  Other Topics Concern   Not on file  Social History Narrative   Not on file   Social Drivers of Health   Financial Resource Strain: Not on file  Food Insecurity: Not on file  Transportation Needs: Not on file  Physical Activity: Not on file  Stress: Not on file  Social Connections: Not on file  Intimate Partner Violence: Not on file     Subjective: Review of Systems  Constitutional:  Negative for chills and fever.  HENT:  Negative for congestion and hearing loss.   Eyes:  Negative for blurred vision and double vision.  Respiratory:  Negative for cough and shortness of breath.   Cardiovascular:  Negative for chest pain and palpitations.  Gastrointestinal:  Negative for abdominal pain, blood in stool, constipation, diarrhea, heartburn, melena and vomiting.  Genitourinary:  Negative for dysuria and urgency.  Musculoskeletal:  Negative for joint pain and myalgias.  Skin:  Negative for itching and rash.  Neurological:  Negative for dizziness and headaches.  Psychiatric/Behavioral:  Negative for depression. The patient is not nervous/anxious.  Objective: BP 104/66 (BP Location: Left Arm, Patient Position: Sitting, Cuff Size: Normal)   Pulse 83   Temp (!) 97.1 F (36.2 C) (Temporal)   Ht 6' 1 (1.854 m)   Wt 188 lb 3.2 oz (85.4 kg)   BMI 24.83 kg/m  Physical Exam Constitutional:      Appearance: Normal appearance.  HENT:     Head: Normocephalic and atraumatic.  Eyes:     Extraocular Movements: Extraocular movements intact.     Conjunctiva/sclera: Conjunctivae normal.  Cardiovascular:     Rate and Rhythm: Normal rate and regular rhythm.  Pulmonary:     Effort: Pulmonary effort is normal.     Breath sounds: Normal breath sounds.  Abdominal:     General: Bowel sounds are normal.     Palpations: Abdomen is soft.  Musculoskeletal:        General: Normal range of motion.     Cervical back: Normal range of motion and neck supple.  Skin:    General: Skin is warm.  Neurological:     General: No focal deficit present.     Mental Status: He is alert and oriented to person, place, and time.  Psychiatric:        Mood and Affect: Mood normal.        Behavior: Behavior normal.      Assessment: *History of adenomatous colon polyps  Plan: Will schedule for surveillance colonoscopy.The risks including  infection, bleed, or perforation as well as benefits, limitations, alternatives and imponderables have been reviewed with the patient. Questions have been answered. All parties agreeable.  Hold iron x 7 days. 05/12/2024 1:49 PM   Disclaimer: This note was dictated with voice recognition software. Similar sounding words can inadvertently be transcribed and may not be corrected upon review.

## 2024-05-12 NOTE — Telephone Encounter (Signed)
 Authorization #[id number] DOS: 06/14/2024 - 08/15/2024

## 2024-05-13 ENCOUNTER — Telehealth: Payer: Self-pay | Admitting: *Deleted

## 2024-05-13 ENCOUNTER — Telehealth: Payer: Self-pay | Admitting: Internal Medicine

## 2024-05-13 NOTE — Telephone Encounter (Signed)
Called pt, unable to leave message due to vm being full.

## 2024-05-13 NOTE — Telephone Encounter (Signed)
 Called spouse to give pre-op appt, no answer and not able to leave VM

## 2024-05-13 NOTE — Telephone Encounter (Signed)
 Wife left a message that someone had called them.  The phone number she left was [phone].

## 2024-05-13 NOTE — Telephone Encounter (Signed)
 See previous TE

## 2024-05-13 NOTE — Telephone Encounter (Signed)
 Spouse called back and she is aware of pre-op appt details. She voiced understanding

## 2024-06-08 NOTE — Patient Instructions (Signed)
 Timothy Simpson [facility]  06/08/2024     @PREFPERIOPPHARMACY @   Your procedure is scheduled on  06/14/2024.   Report to [facility] at  0715 A.M.   Call this number if you have problems the morning of surgery:  [phone]  If you experience any cold or flu symptoms such as cough, fever, chills, shortness of breath, etc. between now and your scheduled surgery, please notify us  at the above number.   Remember:         DO NOT take any medications for diabetes the morning of your procedure.   Follow the diet and prep instructions given to you by the office.   You may drink clear liquids until 0515 am on 06/14/2024.    Clear liquids allowed are:                    Water , Juice (No red color; non-citric and without pulp; diabetics please choose diet or no sugar options), Carbonated beverages (diabetics please choose diet or no sugar options), Clear Tea (No creamer, milk, or cream, including half & half and powdered creamer), Black Coffee Only (No creamer, milk or cream, including half & half and powdered creamer), and Clear Sports drink (No red color; diabetics please choose diet or no sugar options)    Take these medicines the morning of surgery with A SIP OF WATER         allopurinol , amlodipine , donzepezil, memantine, metoprolol .    Do not wear jewelry, make-up or nail polish, including gel polish,  artificial nails, or any other type of covering on natural nails (fingers and  toes).  Do not wear lotions, powders, or perfumes, or deodorant.  Do not shave 48 hours prior to surgery.  Men may shave face and neck.  Do not bring valuables to the hospital.  [facility] is not responsible for any belongings or valuables.  Contacts, dentures or bridgework may not be worn into surgery.  Leave your suitcase in the car.  After surgery it may be brought to your room.  For patients admitted to the hospital, discharge time will be determined by your treatment team.  Patients discharged  the day of surgery will not be allowed to drive home and must have someone with them for 24 hours.    Special instructions:  DO NOT smoke tobacco or vape for 24 hours before your procedure.  Please read over the following fact sheets that you were given. Anesthesia Post-op Instructions and Care and Recovery After Surgery      Colonoscopy, Adult, Care After The following information offers guidance on how to care for yourself after your procedure. Your health care provider may also give you more specific instructions. If you have problems or questions, contact your health care provider. What can I expect after the procedure? After the procedure, it is common to have: A small amount of blood in your stool for 24 hours after the procedure. Some gas. Mild cramping or bloating of your abdomen. Follow these instructions at home: Eating and drinking  Drink enough fluid to keep your urine pale yellow. Follow instructions from your health care provider about eating or drinking restrictions. Resume your normal diet as told by your health care provider. Avoid heavy or fried foods that are hard to digest. Activity Rest as told by your health care provider. Avoid sitting for a long time without moving. Get up to take short walks every 1-2 hours. This is important to  improve blood flow and breathing. Ask for help if you feel weak or unsteady. Return to your normal activities as told by your health care provider. Ask your health care provider what activities are safe for you. Managing cramping and bloating  Try walking around when you have cramps or feel bloated. If directed, apply heat to your abdomen as told by your health care provider. Use the heat source that your health care provider recommends, such as a moist heat pack or a heating pad. Place a towel between your skin and the heat source. Leave the heat on for 20-30 minutes. Remove the heat if your skin turns bright red. This is especially  important if you are unable to feel pain, heat, or cold. You have a greater risk of getting burned. General instructions If you were given a sedative during the procedure, it can affect you for several hours. Do not drive or operate machinery until your health care provider says that it is safe. For the first 24 hours after the procedure: Do not sign important documents. Do not drink alcohol. Do your regular daily activities at a slower pace than normal. Eat soft foods that are easy to digest. Take over-the-counter and prescription medicines only as told by your health care provider. Keep all follow-up visits. This is important. Contact a health care provider if: You have blood in your stool 2-3 days after the procedure. Get help right away if: You have more than a small spotting of blood in your stool. You have large blood clots in your stool. You have swelling of your abdomen. You have nausea or vomiting. You have a fever. You have increasing pain in your abdomen that is not relieved with medicine. These symptoms may be an emergency. Get help right away. Call 911. Do not wait to see if the symptoms will go away. Do not drive yourself to the hospital. Summary After the procedure, it is common to have a small amount of blood in your stool. You may also have mild cramping and bloating of your abdomen. If you were given a sedative during the procedure, it can affect you for several hours. Do not drive or operate machinery until your health care provider says that it is safe. Get help right away if you have a lot of blood in your stool, nausea or vomiting, a fever, or increased pain in your abdomen. This information is not intended to replace advice given to you by your health care provider. Make sure you discuss any questions you have with your health care provider. Document Revised: 12/31/2022 Document Reviewed: 07/11/2021 Elsevier Patient Education  2024 [employer].General Anesthesia,  Adult, Care After The following information offers guidance on how to care for yourself after your procedure. Your health care provider may also give you more specific instructions. If you have problems or questions, contact your health care provider. What can I expect after the procedure? After the procedure, it is common for people to: Have pain or discomfort at the IV site. Have nausea or vomiting. Have a sore throat or hoarseness. Have trouble concentrating. Feel cold or chills. Feel weak, sleepy, or tired (fatigue). Have soreness and body aches. These can affect parts of the body that were not involved in surgery. Follow these instructions at home: For the time period you were told by your health care provider:  Rest. Do not participate in activities where you could fall or become injured. Do not drive or use machinery. Do not drink alcohol. Do  not take sleeping pills or medicines that cause drowsiness. Do not make important decisions or sign legal documents. Do not take care of children on your own. General instructions Drink enough fluid to keep your urine pale yellow. If you have sleep apnea, surgery and certain medicines can increase your risk for breathing problems. Follow instructions from your health care provider about wearing your sleep device: Anytime you are sleeping, including during daytime naps. While taking prescription pain medicines, sleeping medicines, or medicines that make you drowsy. Return to your normal activities as told by your health care provider. Ask your health care provider what activities are safe for you. Take over-the-counter and prescription medicines only as told by your health care provider. Do not use any products that contain nicotine or tobacco. These products include cigarettes, chewing tobacco, and vaping devices, such as e-cigarettes. These can delay incision healing after surgery. If you need help quitting, ask your health care  provider. Contact a health care provider if: You have nausea or vomiting that does not get better with medicine. You vomit every time you eat or drink. You have pain that does not get better with medicine. You cannot urinate or have bloody urine. You develop a skin rash. You have a fever. Get help right away if: You have trouble breathing. You have chest pain. You vomit blood. These symptoms may be an emergency. Get help right away. Call 911. Do not wait to see if the symptoms will go away. Do not drive yourself to the hospital. Summary After the procedure, it is common to have a sore throat, hoarseness, nausea, vomiting, or to feel weak, sleepy, or fatigue. For the time period you were told by your health care provider, do not drive or use machinery. Get help right away if you have difficulty breathing, have chest pain, or vomit blood. These symptoms may be an emergency. This information is not intended to replace advice given to you by your health care provider. Make sure you discuss any questions you have with your health care provider. Document Revised: 02/15/2022 Document Reviewed: 02/15/2022 Elsevier Patient Education  2024 [employer].

## 2024-06-09 ENCOUNTER — Encounter (HOSPITAL_COMMUNITY): Payer: Self-pay

## 2024-06-09 ENCOUNTER — Encounter (HOSPITAL_COMMUNITY)
Admission: RE | Admit: 2024-06-09 | Discharge: 2024-06-09 | Disposition: A | Discharge status: Home / Self Care | Source: Ambulatory Visit | Attending: Internal Medicine | Admitting: Internal Medicine

## 2024-06-09 VITALS — BP 91/57 | HR 66 | Temp 98.0°F | Resp 18 | Ht 73.0 in | Wt 188.3 lb

## 2024-06-09 DIAGNOSIS — Z01812 Encounter for preprocedural laboratory examination: Secondary | ICD-10-CM | POA: Insufficient documentation

## 2024-06-09 DIAGNOSIS — Z79899 Other long term (current) drug therapy: Secondary | ICD-10-CM | POA: Insufficient documentation

## 2024-06-09 DIAGNOSIS — D649 Anemia, unspecified: Secondary | ICD-10-CM | POA: Insufficient documentation

## 2024-06-09 HISTORY — DX: Sleep apnea, unspecified: G47.30

## 2024-06-09 LAB — CBC WITH DIFFERENTIAL/PLATELET
Abs Immature Granulocytes: 0 K/uL (ref 0.00–0.07)
Basophils Absolute: 0.1 K/uL (ref 0.0–0.1)
Basophils Relative: 1 %
Eosinophils Absolute: 0.5 K/uL (ref 0.0–0.5)
Eosinophils Relative: 8 %
HCT: 43.7 % (ref 39.0–52.0)
Hemoglobin: 14 g/dL (ref 13.0–17.0)
Immature Granulocytes: 0 %
Lymphocytes Relative: 36 %
Lymphs Abs: 2.1 K/uL (ref 0.7–4.0)
MCH: 29.4 pg (ref 26.0–34.0)
MCHC: 32 g/dL (ref 30.0–36.0)
MCV: 91.8 fL (ref 80.0–100.0)
Monocytes Absolute: 0.7 K/uL (ref 0.1–1.0)
Monocytes Relative: 12 %
Neutro Abs: 2.6 K/uL (ref 1.7–7.7)
Neutrophils Relative %: 43 %
Platelets: 212 K/uL (ref 150–400)
RBC: 4.76 MIL/uL (ref 4.22–5.81)
RDW: 13.3 % (ref 11.5–15.5)
WBC: 6 K/uL (ref 4.0–10.5)
nRBC: 0 % (ref 0.0–0.2)

## 2024-06-09 LAB — BASIC METABOLIC PANEL WITH GFR
Anion gap: 10 (ref 5–15)
BUN: 13 mg/dL (ref 8–23)
CO2: 34 mmol/L — ABNORMAL HIGH (ref 22–32)
Calcium: 9.6 mg/dL (ref 8.9–10.3)
Chloride: 96 mmol/L — ABNORMAL LOW (ref 98–111)
Creatinine, Ser: 1.08 mg/dL (ref 0.61–1.24)
GFR, Estimated: >60 mL/min (ref >60)
Glucose, Bld: 78 mg/dL (ref 70–99)
Potassium: 4.1 mmol/L (ref 3.5–5.1)
Sodium: 140 mmol/L (ref 135–145)

## 2024-06-14 ENCOUNTER — Encounter (HOSPITAL_COMMUNITY): Payer: Self-pay | Admitting: Internal Medicine

## 2024-06-14 ENCOUNTER — Ambulatory Visit (HOSPITAL_COMMUNITY): Admitting: Anesthesiology

## 2024-06-14 ENCOUNTER — Ambulatory Visit (HOSPITAL_COMMUNITY)
Admission: RE | Admit: 2024-06-14 | Discharge: 2024-06-14 | Disposition: A | Discharge status: Home / Self Care | Attending: Internal Medicine | Admitting: Internal Medicine

## 2024-06-14 ENCOUNTER — Other Ambulatory Visit: Payer: Self-pay

## 2024-06-14 ENCOUNTER — Encounter (HOSPITAL_COMMUNITY)
Admission: RE | Disposition: A | Discharge status: Home / Self Care | Payer: Self-pay | Source: Home / Self Care | Attending: Internal Medicine

## 2024-06-14 DIAGNOSIS — D122 Benign neoplasm of ascending colon: Secondary | ICD-10-CM

## 2024-06-14 DIAGNOSIS — D123 Benign neoplasm of transverse colon: Secondary | ICD-10-CM | POA: Insufficient documentation

## 2024-06-14 DIAGNOSIS — Z1211 Encounter for screening for malignant neoplasm of colon: Secondary | ICD-10-CM | POA: Insufficient documentation

## 2024-06-14 DIAGNOSIS — Z951 Presence of aortocoronary bypass graft: Secondary | ICD-10-CM | POA: Insufficient documentation

## 2024-06-14 DIAGNOSIS — G4730 Sleep apnea, unspecified: Secondary | ICD-10-CM | POA: Insufficient documentation

## 2024-06-14 DIAGNOSIS — Z9889 Other specified postprocedural states: Secondary | ICD-10-CM | POA: Diagnosis not present

## 2024-06-14 DIAGNOSIS — I252 Old myocardial infarction: Secondary | ICD-10-CM | POA: Diagnosis not present

## 2024-06-14 DIAGNOSIS — I2510 Atherosclerotic heart disease of native coronary artery without angina pectoris: Secondary | ICD-10-CM | POA: Diagnosis not present

## 2024-06-14 DIAGNOSIS — I10 Essential (primary) hypertension: Secondary | ICD-10-CM | POA: Insufficient documentation

## 2024-06-14 DIAGNOSIS — Z87891 Personal history of nicotine dependence: Secondary | ICD-10-CM | POA: Insufficient documentation

## 2024-06-14 DIAGNOSIS — F0390 Unspecified dementia without behavioral disturbance: Secondary | ICD-10-CM | POA: Insufficient documentation

## 2024-06-14 HISTORY — PX: COLONOSCOPY: SHX5424

## 2024-06-14 LAB — GLUCOSE, CAPILLARY
Glucose-Capillary: 64 mg/dL — ABNORMAL LOW (ref 70–99)
Glucose-Capillary: 72 mg/dL (ref 70–99)
Glucose-Capillary: 105 mg/dL — ABNORMAL HIGH (ref 70–99)

## 2024-06-14 SURGERY — COLONOSCOPY
Anesthesia: General

## 2024-06-14 MED ORDER — LIDOCAINE 2% (20 MG/ML) 5 ML SYRINGE
INTRAMUSCULAR | Status: DC | PRN
  Administered 2024-06-14: 100 mg via INTRAVENOUS

## 2024-06-14 MED ORDER — DEXTROSE 50 % IV SOLN: 1.0000 | Freq: Once | INTRAVENOUS | Status: AC

## 2024-06-14 MED ORDER — PROPOFOL 500 MG/50ML IV EMUL
INTRAVENOUS | Status: DC | PRN
  Administered 2024-06-14: 100 mg via INTRAVENOUS
  Administered 2024-06-14: 150 ug/kg/min via INTRAVENOUS

## 2024-06-14 MED ORDER — PHENYLEPHRINE 80 MCG/ML (10ML) SYRINGE FOR IV PUSH (FOR BLOOD PRESSURE SUPPORT)
PREFILLED_SYRINGE | INTRAVENOUS | Status: DC | PRN
Start: 2024-06-14 — End: 2024-06-14
  Administered 2024-06-14: 80 ug via INTRAVENOUS
  Administered 2024-06-14 (×2): 160 ug via INTRAVENOUS
  Administered 2024-06-14: 80 ug via INTRAVENOUS
  Administered 2024-06-14 (×2): 160 ug via INTRAVENOUS

## 2024-06-14 MED ORDER — LACTATED RINGERS IV SOLN: INTRAVENOUS | Status: DC

## 2024-06-14 MED ORDER — DEXTROSE 50 % IV SOLN
INTRAVENOUS | Status: AC
  Administered 2024-06-14: 50 mL via INTRAVENOUS
  Filled 2024-06-14: qty 50

## 2024-06-14 NOTE — Transfer of Care (Signed)
 Immediate Anesthesia Transfer of Care Note  Patient: Timothy Simpson  Procedure(s) Performed: COLONOSCOPY  Patient Location: Short Stay  Anesthesia Type:MAC  Level of Consciousness: awake, alert , and oriented  Airway & Oxygen Therapy: Patient Spontanous Breathing  Post-op Assessment: Report given to RN and Post -op Vital signs reviewed and stable  Post vital signs: Reviewed and stable  Last Vitals:  Vitals Value Taken Time  BP 109/61 06/14/24 10:39  Temp    Pulse 77 06/14/24 10:39  Resp 20 06/14/24 10:39  SpO2 100% 06/14/24 10:39    Last Pain:  Vitals:   06/14/24 1012  TempSrc:   PainSc: 0-No pain         Complications: No notable events documented.

## 2024-06-14 NOTE — Op Note (Signed)
 [facility] Patient Name: Timothy Simpson Procedure Date: 06/14/2024 9:49 AM MRN: [id number] Date of Birth: [birth year] Attending MD: Carlin POUR. Cindie , [place], [id number] CSN: [id number] Age: 78 Admit Type: Outpatient Procedure:                Colonoscopy Indications:              Surveillance: Personal history of adenomatous                            polyps on last colonoscopy 5 years ago Providers:                Carlin POUR. Cindie, DO, Crystal Page, [place] Wilson,                            Technician Referring MD:              Medicines:                See the Anesthesia note for documentation of the                            administered medications Complications:            No immediate complications. Estimated Blood Loss:     Estimated blood loss was minimal. Procedure:                Pre-Anesthesia Assessment:                           - The anesthesia plan was to use monitored                            anesthesia care (MAC).                           After obtaining informed consent, the colonoscope                            was passed under direct vision. Throughout the                            procedure, the patient's blood pressure, pulse, and                            oxygen saturations were monitored continuously. The                            [id number]-[id number] ([id number]) was introduced through the                            anus and advanced to the the cecum, identified by                            appendiceal orifice and ileocecal valve. The                            colonoscopy was performed without difficulty. The  patient tolerated the procedure well. The quality                            of the bowel preparation was evaluated using the                            BBPS [facility] Bowel Preparation Scale) with scores                            of: Right Colon = 3, Transverse Colon = 3 and Left                            Colon = 3 (entire  mucosa seen well with no residual                            staining, small fragments of stool or opaque                            liquid). The total BBPS score equals 9. Scope In: 10:18:42 AM Scope Out: 10:30:42 AM Scope Withdrawal Time: 0 hours 8 minutes 52 seconds  Total Procedure Duration: 0 hours 12 minutes 0 seconds  Findings:      Two sessile polyps were found in the ascending colon. The polyps were 3       to 5 mm in size. These polyps were removed with a cold snare. Resection       and retrieval were complete.      A 5 mm polyp was found in the transverse colon. The polyp was sessile.       The polyp was removed with a cold snare. Resection and retrieval were       complete.      The exam was otherwise without abnormality. Impression:               - Two 3 to 5 mm polyps in the ascending colon,                            removed with a cold snare. Resected and retrieved.                           - One 5 mm polyp in the transverse colon, removed                            with a cold snare. Resected and retrieved.                           - The examination was otherwise normal. Moderate Sedation:      Per Anesthesia Care Recommendation:           - Patient has a contact number available for                            emergencies. The signs and symptoms of potential  delayed complications were discussed with the                            patient. Return to normal activities tomorrow.                            Written discharge instructions were provided to the                            patient.                           - Resume previous diet.                           - Continue present medications.                           - Await pathology results.                           - Repeat colonoscopy in 5 years for surveillance if                            benefits outweigh risks.                           - Return to GI clinic PRN. Procedure  Code(s):        --- Professional ---                           [phone], Colonoscopy, flexible; with removal of                            tumor(s), polyp(s), or other lesion(s) by snare                            technique Diagnosis Code(s):        --- Professional ---                           Z86.010, Personal history of colonic polyps                           D12.2, Benign neoplasm of ascending colon                           D12.3, Benign neoplasm of transverse colon (hepatic                            flexure or splenic flexure) CPT copyright 2022 American Medical Association. All rights reserved. The codes documented in this report are preliminary and upon coder review may  be revised to meet current compliance requirements. Carlin POUR. Cindie, DO Carlin POUR. Cindie, DO 06/14/2024 10:34:31 AM This report has been signed electronically. Number of Addenda: 0

## 2024-06-14 NOTE — Discharge Instructions (Addendum)
  Colonoscopy Discharge Instructions  Read the instructions outlined below and refer to this sheet in the next few weeks. These discharge instructions provide you with general information on caring for yourself after you leave the hospital. Your doctor may also give you specific instructions. While your treatment has been planned according to the most current medical practices available, unavoidable complications occasionally occur.   ACTIVITY You may resume your regular activity, but move at a slower pace for the next 24 hours.  Take frequent rest periods for the next 24 hours.  Walking will help get rid of the air and reduce the bloated feeling in your belly (abdomen).  No driving for 24 hours (because of the medicine (anesthesia) used during the test).   Do not sign any important legal documents or operate any machinery for 24 hours (because of the anesthesia used during the test).  NUTRITION Drink plenty of fluids.  You may resume your normal diet as instructed by your doctor.  Begin with a light meal and progress to your normal diet. Heavy or fried foods are harder to digest and may make you feel sick to your stomach (nauseated).  Avoid alcoholic beverages for 24 hours or as instructed.  MEDICATIONS You may resume your normal medications unless your doctor tells you otherwise.  WHAT YOU CAN EXPECT TODAY Some feelings of bloating in the abdomen.  Passage of more gas than usual.  Spotting of blood in your stool or on the toilet paper.  IF YOU HAD POLYPS REMOVED DURING THE COLONOSCOPY: No aspirin products for 7 days or as instructed.  No alcohol for 7 days or as instructed.  Eat a soft diet for the next 24 hours.  FINDING OUT THE RESULTS OF YOUR TEST Not all test results are available during your visit. If your test results are not back during the visit, make an appointment with your caregiver to find out the results. Do not assume everything is normal if you have not heard from your  caregiver or the medical facility. It is important for you to follow up on all of your test results.  SEEK IMMEDIATE MEDICAL ATTENTION IF: You have more than a spotting of blood in your stool.  Your belly is swollen (abdominal distention).  You are nauseated or vomiting.  You have a temperature over 101.  You have abdominal pain or discomfort that is severe or gets worse throughout the day.   Your colonoscopy revealed 3 polyp(s) which I removed successfully. Await pathology results, my office will contact you. I recommend repeating colonoscopy in 5 years for surveillance purposes if benefits outweigh risks. Otherwise follow up with GI as needed.    I hope you have a great rest of your week!  Elon Alas. Abbey Chatters, D.O. Gastroenterology and Hepatology [facility]

## 2024-06-14 NOTE — Anesthesia Preprocedure Evaluation (Addendum)
 Anesthesia Evaluation  Patient identified by MRN, date of birth, ID band Patient awake    Reviewed: Allergy & Precautions, H&P , NPO status , Patient's Chart, lab work & pertinent test results, reviewed documented beta blocker date and time   Airway Mallampati: II  TM Distance: >3 FB Neck ROM: full    Dental  (+) Edentulous Upper, Dental Advisory Given, Missing Only a few rotten teeth left:   Pulmonary sleep apnea , former smoker   Pulmonary exam normal breath sounds clear to auscultation       Cardiovascular Exercise Tolerance: Good hypertension, + CAD, + Past MI and + CABG  Normal cardiovascular exam Rhythm:regular Rate:Normal     Neuro/Psych  PSYCHIATRIC DISORDERS     Dementia Carotid endarterectomy    GI/Hepatic negative GI ROS, Neg liver ROS,,,  Endo/Other  negative endocrine ROS    Renal/GU negative Renal ROS  negative genitourinary   Musculoskeletal   Abdominal   Peds  Hematology negative hematology ROS (+)   Anesthesia Other Findings   Reproductive/Obstetrics negative OB ROS                              Anesthesia Physical Anesthesia Plan  ASA: 3  Anesthesia Plan: General   Post-op Pain Management: Minimal or no pain anticipated   Induction: Intravenous  PONV Risk Score and Plan: Propofol  infusion  Airway Management Planned: Nasal Cannula and Natural Airway  Additional Equipment: None  Intra-op Plan:   Post-operative Plan:   Informed Consent: I have reviewed the patients History and Physical, chart, labs and discussed the procedure including the risks, benefits and alternatives for the proposed anesthesia with the patient or authorized representative who has indicated his/her understanding and acceptance.     Dental Advisory Given  Plan Discussed with: CRNA  Anesthesia Plan Comments:          Anesthesia Quick Evaluation

## 2024-06-14 NOTE — Anesthesia Postprocedure Evaluation (Signed)
 Anesthesia Post Note  Patient: Timothy Simpson  Procedure(s) Performed: COLONOSCOPY  Patient location during evaluation: Phase II Anesthesia Type: General Level of consciousness: awake and alert Pain management: pain level controlled Vital Signs Assessment: post-procedure vital signs reviewed and stable Respiratory status: spontaneous breathing, nonlabored ventilation and respiratory function stable Cardiovascular status: blood pressure returned to baseline and stable Postop Assessment: no apparent nausea or vomiting Anesthetic complications: no   There were no known notable events for this encounter.   Last Vitals:  Vitals:   06/14/24 0919 06/14/24 1040  BP: 124/60 109/61  Pulse: 68 76  Resp: 19 20  Temp: 36.4 C 36.5 C  SpO2: 98% 100%    Last Pain:  Vitals:   06/14/24 1057  TempSrc:   PainSc: 0-No pain                 [place] Witz L Kipper Buch

## 2024-06-14 NOTE — H&P (Signed)
 Primary Care Physician:  Benjamin Raina Elizabeth, NP Primary Gastroenterologist:  Dr. Cindie  Pre-Procedure History & Physical: HPI:  Timothy Simpson is a 78 y.o. 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 Shaaron: Normal.  For history of tubulovillous adenoma back in 2001, five-year follow-up recommended.   COLONOSCOPY  2009   Dr. Thersia: 6 mm polyp in the ascending segment and one in the sigmoid colon removed.  Pathology showed tubular adenomas.  5-year surveillance colonoscopy recommended.   COLONOSCOPY N/A 07/07/2019   Procedure: COLONOSCOPY;  Surgeon: Shaaron Lamar HERO, MD;  Location: [facility];  Service: Endoscopy;  Laterality: N/A;  1:00pm   CORONARY ARTERY BYPASS GRAFT N/A 08/05/2018   Procedure: CORONARY ARTERY BYPASS GRAFTING (CABG) x3. Endoscopic Saphenous vein harvest.;  Surgeon: Dusty Sudie DEL, MD;  Location: [facility] OR;  Service: Open Heart Surgery;  Laterality: N/A;   CORONARY PRESSURE/FFR STUDY N/A 08/04/2018   Procedure: INTRAVASCULAR PRESSURE WIRE/FFR STUDY;  Surgeon: Verlin Lonni BIRCH, MD;  Location: [facility] INVASIVE CV LAB;  Service: Cardiovascular;  Laterality: N/A;   ENDARTERECTOMY Right 10/18/2019   Procedure: ENDARTERECTOMY CAROTID;  Surgeon: Gretta Lonni PARAS, MD;  Location: [facility] OR;  Service: Vascular;  Laterality: Right;   LEFT HEART CATH AND CORONARY ANGIOGRAPHY N/A 07/30/2018    Procedure: LEFT HEART CATH AND CORONARY ANGIOGRAPHY;  Surgeon: Dann Candyce RAMAN, MD;  Location: [facility] INVASIVE CV LAB;  Service: Cardiovascular;  Laterality: N/A;   PATCH ANGIOPLASTY Right 10/18/2019   Procedure: Patch Angioplasty;  Surgeon: Gretta Lonni PARAS, MD;  Location: [facility] OR;  Service: Vascular;  Laterality: Right;   POLYPECTOMY  07/07/2019   Procedure: POLYPECTOMY;  Surgeon: Shaaron Lamar HERO, MD;  Location: [facility];  Service: Endoscopy;;   TEE WITHOUT CARDIOVERSION N/A 08/05/2018   Procedure: TRANSESOPHAGEAL ECHOCARDIOGRAM (TEE);  Surgeon: Dusty Sudie DEL, MD;  Location: [facility] OR;  Service: Open Heart Surgery;  Laterality: N/A;    Prior to Admission medications   Medication Sig Start Date End Date Taking? Authorizing Provider  allopurinol  (ZYLOPRIM ) 100 MG tablet Take 100 mg by mouth daily. 04/28/18   [provider]  amLODipine  (NORVASC ) 5 MG tablet Take 1 tablet (5 mg total) by mouth daily. 11/05/23   Strader, Laymon HERO, PA-C  aspirin  EC 81 MG EC tablet Take 1 tablet (81 mg total) by mouth daily. 08/09/18   Dwan Kyla HERO, PA-C  atorvastatin  (LIPITOR ) 80 MG tablet Take 1 tablet (80 mg total) by mouth daily. 11/05/23   Strader, Laymon HERO, PA-C  Cholecalciferol (VITAMIN D) 125 MCG (5000 UT) CAPS Take 1 capsule by mouth daily. 01/29/22   [provider]  donepezil  (ARICEPT ) 10 MG tablet Take 10 mg by mouth daily. 09/23/23   [provider]  ferrous sulfate  325 (65 FE) MG EC tablet Take 1 tablet (  325 mg total) by mouth 2 (two) times daily. Patient taking differently: Take 325 mg by mouth every evening. 08/11/18   Barrett, Erin R, PA-C  hydrochlorothiazide (HYDRODIURIL) 25 MG tablet Take 25 mg by mouth daily. 04/08/22   [provider]  hydroxypropyl methylcellulose / hypromellose (ISOPTO TEARS / GONIOVISC) 2.5 % ophthalmic solution Place 1 drop into both eyes 2 (two) times daily as needed for dry eyes.  Patient not taking: Reported on 05/12/2024     [provider]  memantine (NAMENDA) 10 MG tablet Take 10 mg by mouth 2 (two) times daily. 05/09/22   [provider]  metFORMIN (GLUCOPHAGE) 500 MG tablet Take 500 mg by mouth daily. 03/23/22   [provider]  metoprolol  tartrate (LOPRESSOR ) 25 MG tablet Take 1 tablet (25 mg total) by mouth 2 (two) times daily. 11/05/23   Strader, Laymon HERO, PA-C  tamsulosin (FLOMAX) 0.4 MG CAPS capsule Take 0.4 mg by mouth at bedtime. 08/25/23   [provider]    Allergies as of 05/12/2024   (No Known Allergies)    Family History  Problem Relation Age of Onset   Hypertension Father     Social History   Socioeconomic History   Marital status: Married    Spouse name: Not on file   Number of children: Not on file   Years of education: Not on file   Highest education level: Not on file  Occupational History   Not on file  Tobacco Use   Smoking status: Former    Current packs/day: 0.00    Average packs/day: 0.3 packs/day for 40.0 years (10.0 ttl pk-yrs)    Types: Cigarettes    Start date: 11/01/1979    Quit date: 11/01/2019    Years since quitting: 4.6   Smokeless tobacco: Never   Tobacco comments:    he sated that he would quit if he was told he had to.   Vaping Use   Vaping status: Never Used  Substance and Sexual Activity   Alcohol use: No   Drug use: No   Sexual activity: Not on file  Other Topics Concern   Not on file  Social History Narrative   Not on file   Social Drivers of Health   Financial Resource Strain: Not on file  Food Insecurity: Not on file  Transportation Needs: Not on file  Physical Activity: Not on file  Stress: Not on file  Social Connections: Not on file  Intimate Partner Violence: Not on file    Review of Systems: See HPI, otherwise negative ROS  Physical Exam: Vital signs in last 24 hours:     General:   Alert,  Well-developed, well-nourished, pleasant and cooperative in NAD Head:  Normocephalic and  atraumatic. Eyes:  Sclera clear, no icterus.   Conjunctiva pink. Ears:  Normal auditory acuity. Nose:  No deformity, discharge,  or lesions. Msk:  Symmetrical without gross deformities. Normal posture. Extremities:  Without clubbing or edema. Neurologic:  Alert and  oriented x4;  grossly normal neurologically. Skin:  Intact without significant lesions or rashes. Psych:  Alert and cooperative. Normal mood and affect.  Impression/Plan: Timothy Simpson is here for a colonoscopy to be performed for surveillance purposes, personal history of adenomatous colon polyps in 2020  The risks of the procedure including infection, bleed, or perforation as well as benefits, limitations, alternatives and imponderables have been reviewed with the patient. Questions have been answered. All parties agreeable.

## 2024-06-15 ENCOUNTER — Encounter (HOSPITAL_COMMUNITY): Payer: Self-pay | Admitting: Internal Medicine

## 2024-06-15 LAB — SURGICAL PATHOLOGY

## 2024-06-16 ENCOUNTER — Ambulatory Visit: Payer: Self-pay | Admitting: Internal Medicine

## 2024-06-25 NOTE — Progress Notes (Signed)
 Done

## 2024-12-01 ENCOUNTER — Ambulatory Visit: Admitting: Student

## 2024-12-21 NOTE — Progress Notes (Unsigned)
 "  Cardiology Office Note    Date:  12/23/2024  ID:  Timothy Simpson, Timothy Simpson January 21, 1946, MRN [id number] Cardiologist: Alvan Carrier, MD Cardiology APP:  Johnson Laymon HERO, PA-C { : History of Present Illness:    Timothy Simpson is a 79 y.o. male with past medical history of CAD (s/p CABG in 08/2018 with LIMA-LAD and seq-SVG to medial and lateral branches of RI), HTN, HLD, carotid artery stenosis (s/p R CEA in 10/2019) and prior tobacco use who presents to the office today for annual follow-up.  He was last examined by myself in 11/2023 and denied any recent anginal symptoms at that time. His wife did report he was having worsening memory issues in the setting of dementia. No changes were made to his cardiac medications and he was continued on Amlodipine  5 mg daily, ASA 81 mg daily, Atorvastatin  80 mg daily, HCTZ 25 mg daily and Lopressor  25 mg twice daily.  In talking with the patient and his wife today, he reports things have overall been going well since his last office visit. His wife is concerned that he is not as active as he previously used to be as he prefers to take naps throughout the day. He denies any specific chest pain or dyspnea on exertion. No recent orthopnea, PND or pitting edema. He does use a CPAP at night but has not used in the past week due to recent dental extractions. Was previously tolerating well prior to this. They do not check his blood pressure routinely at home but it is well-controlled at 114/80 during today's visit.   Studies Reviewed:   EKG: EKG is ordered today and demonstrates:   EKG Interpretation Date/Time:  Wednesday December 22 2024 10:12:50 EST Ventricular Rate:  71 PR Interval:  216 QRS Duration:  110 QT Interval:  406 QTC Calculation: 441 R Axis:   63  Text Interpretation: Sinus rhythm with 1st degree A-V block Incomplete right bundle branch block Confirmed by Johnson Laymon ([id number]) on 12/22/2024 10:18:40 AM       Limited Echo:  08/2018 Study Conclusions   - Left ventricle: The cavity size was normal. Systolic function was    normal. The estimated ejection fraction was in the range of 55%    to 60%. Wall motion was normal; there were no regional wall    motion abnormalities. Features are consistent with a pseudonormal    left ventricular filling pattern, with concomitant abnormal    relaxation and increased filling pressure (grade 2 diastolic    dysfunction).  - Aortic valve: Valve mobility was restricted. Transvalvular    velocity was within the normal range. There was no stenosis.    There was no regurgitation.  - Mitral valve: Transvalvular velocity was within the normal range.    There was no evidence for stenosis. There was mild regurgitation.  - Right ventricle: The cavity size was normal. Wall thickness was    normal. Systolic function was normal.  - Atrial septum: No defect or patent foramen ovale was identified    by color flow Doppler.  - Tricuspid valve: There was mild regurgitation.   Carotid Dopplers: 11/2023 Summary:  Right Carotid: Velocities in the right ICA are consistent with a 1-39%  stenosis.   Left Carotid: Velocities in the left ICA are consistent with a 1-39%  stenosis.   Vertebrals: Right vertebral artery demonstrates antegrade flow. Left  vertebral             artery was not visualized.   *  See table(s) above for measurements and observations.      Physical Exam:   VS:  BP 114/80   Pulse 71   Ht 6' 1 (1.854 m)   Wt 182 lb 9.6 oz (82.8 kg)   SpO2 93%   BMI 24.09 kg/m    Wt Readings from Last 3 Encounters:  12/22/24 182 lb 9.6 oz (82.8 kg)  06/14/24 188 lb 4.4 oz (85.4 kg)  06/09/24 188 lb 4.4 oz (85.4 kg)     GEN: Pleasant male appearing in no acute distress NECK: No JVD; No carotid bruits CARDIAC: RRR, no murmurs, rubs, gallops RESPIRATORY:  Clear to auscultation without rales, wheezing or rhonchi  ABDOMEN: Appears non-distended. No obvious abdominal  masses. EXTREMITIES: No clubbing or cyanosis. No pitting edema.  Distal pedal pulses are 2+ bilaterally.   Assessment and Plan:   1. Coronary artery disease involving native coronary artery of native heart without angina pectoris - He is s/p CABG in 08/2018 with LIMA-LAD and seq-SVG to medial and lateral branches of RI. His activity has decreased in the setting of his dementia but he denies any anginal symptoms when performing routine activities.  - Continue current medical therapy with ASA 81mg  daily, Atorvastatin  80mg  daily and Lopressor  25mg  BID.   2. Bilateral carotid artery stenosis - He previously underwent R CEA and dopplers in 11/2023 showed 1-39% stenosis bilaterally. He is followed by Vascular Surgery and overdue for follow-up. His wife plans to call within the next few days to schedule an appointment. Continue ASA and statin therapy.   3. Essential hypertension - His blood pressure is well-controlled at 114/80 during today's visit. Continue current medical therapy with Amlodipine  5mg  daily, Hydrochlorothiazide 25mg  daily and Lopressor  25mg  BID. Creatinine was stable at 1.27 when checked in 10/2024.  4. Mixed hyperlipidemia - His LDL was at 94 in 10/2024. His wife reports he was taking his medications intermittently around that time frame. Compliance encouraged. Continue Atorvastatin  80mg  daily for now. If LDL remains above goal, would add Zetia. He prefers to avoid additional medications.    Signed, Laymon CHRISTELLA Qua, PA-C   "

## 2024-12-22 ENCOUNTER — Ambulatory Visit: Attending: Student | Admitting: Student

## 2024-12-22 ENCOUNTER — Encounter: Payer: Self-pay | Admitting: Student

## 2024-12-22 VITALS — BP 114/80 | HR 71 | Ht 73.0 in | Wt 182.6 lb

## 2024-12-22 DIAGNOSIS — E782 Mixed hyperlipidemia: Secondary | ICD-10-CM

## 2024-12-22 DIAGNOSIS — I6523 Occlusion and stenosis of bilateral carotid arteries: Secondary | ICD-10-CM | POA: Diagnosis not present

## 2024-12-22 DIAGNOSIS — I10 Essential (primary) hypertension: Secondary | ICD-10-CM | POA: Diagnosis not present

## 2024-12-22 DIAGNOSIS — I2510 Atherosclerotic heart disease of native coronary artery without angina pectoris: Secondary | ICD-10-CM | POA: Diagnosis not present

## 2024-12-22 MED ORDER — METOPROLOL TARTRATE 25 MG PO TABS: 25.0000 mg | ORAL_TABLET | Freq: Two times a day (BID) | ORAL | 3 refills | Status: AC

## 2024-12-22 MED ORDER — AMLODIPINE BESYLATE 5 MG PO TABS: 5.0000 mg | ORAL_TABLET | Freq: Every day | ORAL | 3 refills | Status: AC

## 2024-12-22 MED ORDER — ATORVASTATIN CALCIUM 80 MG PO TABS: 80.0000 mg | ORAL_TABLET | Freq: Every day | ORAL | 3 refills | Status: AC

## 2024-12-22 NOTE — Patient Instructions (Signed)
 Medication Instructions:  Your physician recommends that you continue on your current medications as directed. Please refer to the Current Medication list given to you today.  *If you need a refill on your cardiac medications before your next appointment, please call your pharmacy*  Lab Work: None If you have labs (blood work) drawn today and your tests are completely normal, you will receive your results only by: MyChart Message (if you have MyChart) OR A paper copy in the mail If you have any lab test that is abnormal or we need to change your treatment, we will call you to review the results.  Testing/Procedures: None  Follow-Up: At [facility], you and your health needs are our priority.  As part of our continuing mission to provide you with exceptional heart care, our providers are all part of one team.  This team includes your primary Cardiologist (physician) and Advanced Practice Providers or APPs (Physician Assistants and Nurse Practitioners) who all work together to provide you with the care you need, when you need it.  Your next appointment:   1 year(s)  Provider:   You may see Alvan Carrier, MD or one of the following Advanced Practice Providers on your designated Care Team:   Laymon Qua, PA-C  [place], [place] Olivia [place], [place]     We recommend signing up for the patient portal called MyChart.  Sign up information is provided on this After Visit Summary.  MyChart is used to connect with patients for Virtual Visits (Telemedicine).  Patients are able to view lab/test results, encounter notes, upcoming appointments, etc.  Non-urgent messages can be sent to your provider as well.   To learn more about what you can do with MyChart, go to [email or web address].   Other Instructions Thank you for choosing [facility]!

## 2024-12-23 ENCOUNTER — Encounter: Payer: Self-pay | Admitting: Student
